# Patient Record
Sex: Female | Born: 1935 | Race: White | Hispanic: No | Smoking: Former smoker
Health system: Southern US, Community
[De-identification: ages and names within clinical notes are randomized; demographics above are authoritative.]

## PROBLEM LIST (undated history)

## (undated) DIAGNOSIS — C50919 Malignant neoplasm of unspecified site of unspecified female breast: Secondary | ICD-10-CM

## (undated) DIAGNOSIS — Z952 Presence of prosthetic heart valve: Secondary | ICD-10-CM

## (undated) DIAGNOSIS — E785 Hyperlipidemia, unspecified: Secondary | ICD-10-CM

## (undated) DIAGNOSIS — R011 Cardiac murmur, unspecified: Secondary | ICD-10-CM

## (undated) DIAGNOSIS — I10 Essential (primary) hypertension: Secondary | ICD-10-CM

## (undated) DIAGNOSIS — I350 Nonrheumatic aortic (valve) stenosis: Secondary | ICD-10-CM

## (undated) HISTORY — PX: TONSILLECTOMY: SUR1361

## (undated) HISTORY — DX: Malignant neoplasm of unspecified site of unspecified female breast: C50.919

## (undated) HISTORY — PX: CARDIAC CATHETERIZATION: SHX172

## (undated) HISTORY — PX: BILATERAL KNEE ARTHROSCOPY: SUR91

## (undated) HISTORY — DX: Cardiac murmur, unspecified: R01.1

## (undated) HISTORY — PX: OTHER SURGICAL HISTORY: SHX169

## (undated) HISTORY — DX: Hyperlipidemia, unspecified: E78.5

## (undated) HISTORY — DX: Essential (primary) hypertension: I10

---

## 1999-11-18 ENCOUNTER — Other Ambulatory Visit: Admission: RE | Admit: 1999-11-18 | Discharge: 1999-11-18 | Payer: Self-pay | Admitting: Internal Medicine

## 2001-02-22 ENCOUNTER — Ambulatory Visit (HOSPITAL_COMMUNITY): Admission: RE | Admit: 2001-02-22 | Discharge: 2001-02-22 | Payer: Self-pay | Admitting: *Deleted

## 2001-07-25 ENCOUNTER — Ambulatory Visit (HOSPITAL_BASED_OUTPATIENT_CLINIC_OR_DEPARTMENT_OTHER): Admission: RE | Admit: 2001-07-25 | Discharge: 2001-07-25 | Payer: Self-pay | Admitting: Orthopedic Surgery

## 2002-03-10 ENCOUNTER — Other Ambulatory Visit: Admission: RE | Admit: 2002-03-10 | Discharge: 2002-03-10 | Payer: Self-pay | Admitting: Obstetrics and Gynecology

## 2002-11-16 DIAGNOSIS — C50919 Malignant neoplasm of unspecified site of unspecified female breast: Secondary | ICD-10-CM

## 2002-11-16 HISTORY — DX: Malignant neoplasm of unspecified site of unspecified female breast: C50.919

## 2002-11-16 HISTORY — PX: BREAST LUMPECTOMY: SHX2

## 2003-04-11 ENCOUNTER — Other Ambulatory Visit: Admission: RE | Admit: 2003-04-11 | Discharge: 2003-04-11 | Payer: Self-pay | Admitting: Diagnostic Radiology

## 2003-04-11 ENCOUNTER — Encounter: Payer: Self-pay | Admitting: General Surgery

## 2003-04-11 ENCOUNTER — Encounter (INDEPENDENT_AMBULATORY_CARE_PROVIDER_SITE_OTHER): Payer: Self-pay | Admitting: *Deleted

## 2003-04-11 ENCOUNTER — Encounter: Admission: RE | Admit: 2003-04-11 | Discharge: 2003-04-11 | Payer: Self-pay | Admitting: General Surgery

## 2003-04-18 ENCOUNTER — Encounter (HOSPITAL_COMMUNITY): Admission: RE | Admit: 2003-04-18 | Discharge: 2003-07-17 | Payer: Self-pay | Admitting: General Surgery

## 2003-04-18 ENCOUNTER — Encounter: Payer: Self-pay | Admitting: General Surgery

## 2003-04-19 ENCOUNTER — Encounter: Payer: Self-pay | Admitting: General Surgery

## 2003-04-27 ENCOUNTER — Encounter: Admission: RE | Admit: 2003-04-27 | Discharge: 2003-04-27 | Payer: Self-pay | Admitting: General Surgery

## 2003-04-27 ENCOUNTER — Encounter: Payer: Self-pay | Admitting: General Surgery

## 2003-04-30 ENCOUNTER — Ambulatory Visit (HOSPITAL_BASED_OUTPATIENT_CLINIC_OR_DEPARTMENT_OTHER): Admission: RE | Admit: 2003-04-30 | Discharge: 2003-04-30 | Payer: Self-pay | Admitting: General Surgery

## 2003-04-30 ENCOUNTER — Encounter: Admission: RE | Admit: 2003-04-30 | Discharge: 2003-04-30 | Payer: Self-pay | Admitting: General Surgery

## 2003-04-30 ENCOUNTER — Encounter (INDEPENDENT_AMBULATORY_CARE_PROVIDER_SITE_OTHER): Payer: Self-pay | Admitting: Specialist

## 2003-04-30 ENCOUNTER — Encounter: Payer: Self-pay | Admitting: General Surgery

## 2003-05-11 ENCOUNTER — Ambulatory Visit: Admission: RE | Admit: 2003-05-11 | Discharge: 2003-07-25 | Payer: Self-pay | Admitting: Radiation Oncology

## 2003-08-30 ENCOUNTER — Ambulatory Visit: Admission: RE | Admit: 2003-08-30 | Discharge: 2003-08-30 | Payer: Self-pay | Admitting: Radiation Oncology

## 2003-10-28 ENCOUNTER — Emergency Department (HOSPITAL_COMMUNITY): Admission: EM | Admit: 2003-10-28 | Discharge: 2003-10-28 | Payer: Self-pay | Admitting: Emergency Medicine

## 2004-02-28 ENCOUNTER — Ambulatory Visit: Admission: RE | Admit: 2004-02-28 | Discharge: 2004-02-28 | Payer: Self-pay | Admitting: Radiation Oncology

## 2004-03-10 ENCOUNTER — Encounter: Admission: RE | Admit: 2004-03-10 | Discharge: 2004-03-10 | Payer: Self-pay | Admitting: Oncology

## 2004-06-03 ENCOUNTER — Encounter: Admission: RE | Admit: 2004-06-03 | Discharge: 2004-06-03 | Payer: Self-pay | Admitting: Oncology

## 2004-12-15 ENCOUNTER — Ambulatory Visit: Payer: Self-pay | Admitting: Oncology

## 2005-03-03 ENCOUNTER — Other Ambulatory Visit: Admission: RE | Admit: 2005-03-03 | Discharge: 2005-03-03 | Payer: Self-pay | Admitting: Internal Medicine

## 2005-03-11 ENCOUNTER — Encounter: Admission: RE | Admit: 2005-03-11 | Discharge: 2005-03-11 | Payer: Self-pay | Admitting: General Surgery

## 2005-06-04 ENCOUNTER — Ambulatory Visit: Payer: Self-pay | Admitting: Oncology

## 2005-12-10 ENCOUNTER — Ambulatory Visit: Payer: Self-pay | Admitting: Oncology

## 2006-03-19 ENCOUNTER — Encounter: Admission: RE | Admit: 2006-03-19 | Discharge: 2006-03-19 | Payer: Self-pay | Admitting: Oncology

## 2006-06-04 ENCOUNTER — Ambulatory Visit: Payer: Self-pay | Admitting: Oncology

## 2006-06-04 LAB — CBC WITH DIFFERENTIAL/PLATELET
BASO%: 0.4 % (ref 0.0–2.0)
Basophils Absolute: 0 10*3/uL (ref 0.0–0.1)
EOS%: 2.7 % (ref 0.0–7.0)
Eosinophils Absolute: 0.1 10*3/uL (ref 0.0–0.5)
HCT: 32.7 % — ABNORMAL LOW (ref 34.8–46.6)
HGB: 11.1 g/dL — ABNORMAL LOW (ref 11.6–15.9)
LYMPH%: 26.2 % (ref 14.0–48.0)
MCH: 32.1 pg (ref 26.0–34.0)
MCHC: 34.1 g/dL (ref 32.0–36.0)
MCV: 94.3 fL (ref 81.0–101.0)
MONO#: 0.4 10*3/uL (ref 0.1–0.9)
MONO%: 8.9 % (ref 0.0–13.0)
NEUT#: 2.8 10*3/uL (ref 1.5–6.5)
NEUT%: 61.8 % (ref 39.6–76.8)
Platelets: 211 10*3/uL (ref 145–400)
RBC: 3.47 10*6/uL — ABNORMAL LOW (ref 3.70–5.32)
RDW: 14.4 % (ref 11.3–14.5)
WBC: 4.5 10*3/uL (ref 3.9–10.0)
lymph#: 1.2 10*3/uL (ref 0.9–3.3)

## 2006-06-04 LAB — COMPREHENSIVE METABOLIC PANEL
ALT: 9 U/L (ref 0–40)
AST: 18 U/L (ref 0–37)
Albumin: 4.4 g/dL (ref 3.5–5.2)
Alkaline Phosphatase: 28 U/L — ABNORMAL LOW (ref 39–117)
BUN: 24 mg/dL — ABNORMAL HIGH (ref 6–23)
CO2: 26 mEq/L (ref 19–32)
Calcium: 9.5 mg/dL (ref 8.4–10.5)
Chloride: 101 mEq/L (ref 96–112)
Creatinine, Ser: 0.9 mg/dL (ref 0.40–1.20)
Glucose, Bld: 97 mg/dL (ref 70–99)
Potassium: 4.8 mEq/L (ref 3.5–5.3)
Sodium: 138 mEq/L (ref 135–145)
Total Bilirubin: 0.4 mg/dL (ref 0.3–1.2)
Total Protein: 6.7 g/dL (ref 6.0–8.3)

## 2006-06-04 LAB — CANCER ANTIGEN 27.29: CA 27.29: 14 U/mL (ref 0–39)

## 2006-12-08 ENCOUNTER — Ambulatory Visit: Payer: Self-pay | Admitting: Oncology

## 2007-03-21 ENCOUNTER — Encounter: Admission: RE | Admit: 2007-03-21 | Discharge: 2007-03-21 | Payer: Self-pay | Admitting: Endocrinology

## 2007-06-01 ENCOUNTER — Ambulatory Visit: Payer: Self-pay | Admitting: Oncology

## 2007-06-03 LAB — CBC WITH DIFFERENTIAL/PLATELET
BASO%: 0.4 % (ref 0.0–2.0)
Basophils Absolute: 0 10*3/uL (ref 0.0–0.1)
EOS%: 3.7 % (ref 0.0–7.0)
Eosinophils Absolute: 0.1 10*3/uL (ref 0.0–0.5)
HCT: 31.9 % — ABNORMAL LOW (ref 34.8–46.6)
HGB: 11.1 g/dL — ABNORMAL LOW (ref 11.6–15.9)
LYMPH%: 33.2 % (ref 14.0–48.0)
MCH: 32.2 pg (ref 26.0–34.0)
MCHC: 34.7 g/dL (ref 32.0–36.0)
MCV: 92.8 fL (ref 81.0–101.0)
MONO#: 0.4 10*3/uL (ref 0.1–0.9)
MONO%: 11 % (ref 0.0–13.0)
NEUT#: 1.7 10*3/uL (ref 1.5–6.5)
NEUT%: 51.7 % (ref 39.6–76.8)
Platelets: 187 10*3/uL (ref 145–400)
RBC: 3.44 10*6/uL — ABNORMAL LOW (ref 3.70–5.32)
RDW: 14.9 % — ABNORMAL HIGH (ref 11.3–14.5)
WBC: 3.4 10*3/uL — ABNORMAL LOW (ref 3.9–10.0)
lymph#: 1.1 10*3/uL (ref 0.9–3.3)

## 2007-06-03 LAB — COMPREHENSIVE METABOLIC PANEL
ALT: <8 U/L (ref 0–35)
AST: 18 U/L (ref 0–37)
Albumin: 4.3 g/dL (ref 3.5–5.2)
Alkaline Phosphatase: 28 U/L — ABNORMAL LOW (ref 39–117)
BUN: 27 mg/dL — ABNORMAL HIGH (ref 6–23)
CO2: 24 mEq/L (ref 19–32)
Calcium: 9.3 mg/dL (ref 8.4–10.5)
Chloride: 105 mEq/L (ref 96–112)
Creatinine, Ser: 0.84 mg/dL (ref 0.40–1.20)
Glucose, Bld: 107 mg/dL — ABNORMAL HIGH (ref 70–99)
Potassium: 4.5 mEq/L (ref 3.5–5.3)
Sodium: 139 mEq/L (ref 135–145)
Total Bilirubin: 0.4 mg/dL (ref 0.3–1.2)
Total Protein: 6.7 g/dL (ref 6.0–8.3)

## 2007-06-03 LAB — VITAMIN B12: Vitamin B-12: 207 pg/mL — ABNORMAL LOW (ref 211–911)

## 2007-06-03 LAB — FOLATE: Folate: >20 ng/mL

## 2007-06-03 LAB — FERRITIN: Ferritin: 120 ng/mL (ref 10–291)

## 2007-06-22 ENCOUNTER — Other Ambulatory Visit: Admission: RE | Admit: 2007-06-22 | Discharge: 2007-06-22 | Payer: Self-pay | Admitting: Internal Medicine

## 2007-07-28 ENCOUNTER — Ambulatory Visit: Payer: Self-pay | Admitting: Oncology

## 2007-08-01 LAB — CBC & DIFF AND RETIC
BASO%: 0.5 % (ref 0.0–2.0)
Basophils Absolute: 0 10*3/uL (ref 0.0–0.1)
EOS%: 2.4 % (ref 0.0–7.0)
Eosinophils Absolute: 0.1 10*3/uL (ref 0.0–0.5)
HCT: 31 % — ABNORMAL LOW (ref 34.8–46.6)
HGB: 11 g/dL — ABNORMAL LOW (ref 11.6–15.9)
IRF: 0.3 (ref 0.130–0.330)
LYMPH%: 33.2 % (ref 14.0–48.0)
MCH: 32.7 pg (ref 26.0–34.0)
MCHC: 35.3 g/dL (ref 32.0–36.0)
MCV: 92.6 fL (ref 81.0–101.0)
MONO#: 0.3 10*3/uL (ref 0.1–0.9)
MONO%: 9.4 % (ref 0.0–13.0)
NEUT#: 1.9 10*3/uL (ref 1.5–6.5)
NEUT%: 54.5 % (ref 39.6–76.8)
Platelets: 223 10*3/uL (ref 145–400)
RBC: 3.35 10*6/uL — ABNORMAL LOW (ref 3.70–5.32)
RDW: 14.5 % (ref 11.3–14.5)
RETIC #: 39.9 10*3/uL (ref 19.7–115.1)
Retic %: 1.2 % (ref 0.4–2.3)
WBC: 3.4 10*3/uL — ABNORMAL LOW (ref 3.9–10.0)
lymph#: 1.1 10*3/uL (ref 0.9–3.3)

## 2007-08-01 LAB — VITAMIN B12: Vitamin B-12: 445 pg/mL (ref 211–911)

## 2007-08-03 LAB — METHYLMALONIC ACID, SERUM: Methylmalonic Acid, Quantitative: 254 nmol/L (ref 87–318)

## 2007-10-07 ENCOUNTER — Encounter: Admission: RE | Admit: 2007-10-07 | Discharge: 2007-10-07 | Payer: Self-pay | Admitting: General Surgery

## 2007-12-12 ENCOUNTER — Ambulatory Visit: Payer: Self-pay | Admitting: Oncology

## 2008-05-15 ENCOUNTER — Ambulatory Visit: Payer: Self-pay | Admitting: Oncology

## 2008-05-17 LAB — CBC & DIFF AND RETIC
BASO%: 0.4 % (ref 0.0–2.0)
Basophils Absolute: 0 10*3/uL (ref 0.0–0.1)
EOS%: 2.9 % (ref 0.0–7.0)
Eosinophils Absolute: 0.1 10*3/uL (ref 0.0–0.5)
HCT: 32.2 % — ABNORMAL LOW (ref 34.8–46.6)
HGB: 11.2 g/dL — ABNORMAL LOW (ref 11.6–15.9)
IRF: 0.49 — ABNORMAL HIGH (ref 0.130–0.330)
LYMPH%: 31 % (ref 14.0–48.0)
MCH: 32.3 pg (ref 26.0–34.0)
MCHC: 34.8 g/dL (ref 32.0–36.0)
MCV: 92.8 fL (ref 81.0–101.0)
MONO#: 0.3 10*3/uL (ref 0.1–0.9)
MONO%: 9.3 % (ref 0.0–13.0)
NEUT#: 2.1 10*3/uL (ref 1.5–6.5)
NEUT%: 56.4 % (ref 39.6–76.8)
Platelets: 193 10*3/uL (ref 145–400)
RBC: 3.47 10*6/uL — ABNORMAL LOW (ref 3.70–5.32)
RDW: 14.5 % (ref 11.3–14.5)
RETIC #: 49.6 10*3/uL (ref 19.7–115.1)
Retic %: 1.4 % (ref 0.4–2.3)
WBC: 3.8 10*3/uL — ABNORMAL LOW (ref 3.9–10.0)
lymph#: 1.2 10*3/uL (ref 0.9–3.3)

## 2008-05-21 LAB — COMPREHENSIVE METABOLIC PANEL
ALT: 13 U/L (ref 0–35)
AST: 22 U/L (ref 0–37)
Albumin: 4.6 g/dL (ref 3.5–5.2)
Alkaline Phosphatase: 23 U/L — ABNORMAL LOW (ref 39–117)
BUN: 24 mg/dL — ABNORMAL HIGH (ref 6–23)
CO2: 24 mEq/L (ref 19–32)
Calcium: 9.4 mg/dL (ref 8.4–10.5)
Chloride: 104 mEq/L (ref 96–112)
Creatinine, Ser: 0.8 mg/dL (ref 0.40–1.20)
Glucose, Bld: 103 mg/dL — ABNORMAL HIGH (ref 70–99)
Potassium: 4.4 mEq/L (ref 3.5–5.3)
Sodium: 140 mEq/L (ref 135–145)
Total Bilirubin: 0.5 mg/dL (ref 0.3–1.2)
Total Protein: 7 g/dL (ref 6.0–8.3)

## 2008-05-21 LAB — CANCER ANTIGEN 27.29: CA 27.29: 13 U/mL (ref 0–39)

## 2008-05-21 LAB — FOLATE: Folate: >20 ng/mL

## 2008-05-21 LAB — FERRITIN: Ferritin: 84 ng/mL (ref 10–291)

## 2008-05-21 LAB — VITAMIN B12: Vitamin B-12: 461 pg/mL (ref 211–911)

## 2008-05-21 LAB — VITAMIN D 25 HYDROXY (VIT D DEFICIENCY, FRACTURES): Vit D, 25-Hydroxy: 31 ng/mL (ref 30–89)

## 2009-01-04 ENCOUNTER — Encounter: Admission: RE | Admit: 2009-01-04 | Discharge: 2009-01-04 | Payer: Self-pay | Admitting: Endocrinology

## 2009-05-14 ENCOUNTER — Ambulatory Visit: Payer: Self-pay | Admitting: Oncology

## 2009-05-14 LAB — COMPREHENSIVE METABOLIC PANEL
ALT: <8 U/L (ref 0–35)
AST: 14 U/L (ref 0–37)
Albumin: 3.9 g/dL (ref 3.5–5.2)
Alkaline Phosphatase: 27 U/L — ABNORMAL LOW (ref 39–117)
BUN: 23 mg/dL (ref 6–23)
CO2: 24 mEq/L (ref 19–32)
Calcium: 8.8 mg/dL (ref 8.4–10.5)
Chloride: 108 mEq/L (ref 96–112)
Creatinine, Ser: 0.8 mg/dL (ref 0.40–1.20)
Glucose, Bld: 103 mg/dL — ABNORMAL HIGH (ref 70–99)
Potassium: 4.5 mEq/L (ref 3.5–5.3)
Sodium: 141 mEq/L (ref 135–145)
Total Bilirubin: 0.4 mg/dL (ref 0.3–1.2)
Total Protein: 6.1 g/dL (ref 6.0–8.3)

## 2009-05-14 LAB — CBC WITH DIFFERENTIAL/PLATELET
BASO%: 0.6 % (ref 0.0–2.0)
Basophils Absolute: 0 10*3/uL (ref 0.0–0.1)
EOS%: 4.4 % (ref 0.0–7.0)
Eosinophils Absolute: 0.2 10*3/uL (ref 0.0–0.5)
HCT: 31.4 % — ABNORMAL LOW (ref 34.8–46.6)
HGB: 10.7 g/dL — ABNORMAL LOW (ref 11.6–15.9)
LYMPH%: 15.3 % (ref 14.0–49.7)
MCH: 32.2 pg (ref 25.1–34.0)
MCHC: 34.1 g/dL (ref 31.5–36.0)
MCV: 94.6 fL (ref 79.5–101.0)
MONO#: 0.3 10*3/uL (ref 0.1–0.9)
MONO%: 8.2 % (ref 0.0–14.0)
NEUT#: 2.6 10*3/uL (ref 1.5–6.5)
NEUT%: 71.5 % (ref 38.4–76.8)
Platelets: 197 10*3/uL (ref 145–400)
RBC: 3.32 10*6/uL — ABNORMAL LOW (ref 3.70–5.45)
RDW: 14.9 % — ABNORMAL HIGH (ref 11.2–14.5)
WBC: 3.6 10*3/uL — ABNORMAL LOW (ref 3.9–10.3)
lymph#: 0.5 10*3/uL — ABNORMAL LOW (ref 0.9–3.3)

## 2009-05-14 LAB — LIPID PANEL
Cholesterol: 238 mg/dL — ABNORMAL HIGH (ref 0–200)
HDL: 73 mg/dL (ref >39)
LDL Cholesterol: 152 mg/dL — ABNORMAL HIGH (ref 0–99)
Total CHOL/HDL Ratio: 3.3 Ratio
Triglycerides: 63 mg/dL (ref <150)
VLDL: 13 mg/dL (ref 0–40)

## 2009-06-03 ENCOUNTER — Other Ambulatory Visit: Admission: RE | Admit: 2009-06-03 | Discharge: 2009-06-03 | Payer: Self-pay | Admitting: Endocrinology

## 2010-01-08 ENCOUNTER — Encounter: Admission: RE | Admit: 2010-01-08 | Discharge: 2010-01-08 | Payer: Self-pay | Admitting: General Surgery

## 2010-05-20 ENCOUNTER — Ambulatory Visit: Payer: Self-pay | Admitting: Oncology

## 2010-05-22 LAB — CBC WITH DIFFERENTIAL/PLATELET
BASO%: 0.5 % (ref 0.0–2.0)
Basophils Absolute: 0 10*3/uL (ref 0.0–0.1)
EOS%: 5.2 % (ref 0.0–7.0)
Eosinophils Absolute: 0.2 10*3/uL (ref 0.0–0.5)
HCT: 33.1 % — ABNORMAL LOW (ref 34.8–46.6)
HGB: 11.3 g/dL — ABNORMAL LOW (ref 11.6–15.9)
LYMPH%: 30.1 % (ref 14.0–49.7)
MCH: 32.6 pg (ref 25.1–34.0)
MCHC: 34.2 g/dL (ref 31.5–36.0)
MCV: 95.4 fL (ref 79.5–101.0)
MONO#: 0.3 10*3/uL (ref 0.1–0.9)
MONO%: 9 % (ref 0.0–14.0)
NEUT#: 2 10*3/uL (ref 1.5–6.5)
NEUT%: 55.2 % (ref 38.4–76.8)
Platelets: 172 10*3/uL (ref 145–400)
RBC: 3.47 10*6/uL — ABNORMAL LOW (ref 3.70–5.45)
RDW: 14.5 % (ref 11.2–14.5)
WBC: 3.7 10*3/uL — ABNORMAL LOW (ref 3.9–10.3)
lymph#: 1.1 10*3/uL (ref 0.9–3.3)

## 2010-05-22 LAB — COMPREHENSIVE METABOLIC PANEL
ALT: <8 U/L (ref 0–35)
AST: 19 U/L (ref 0–37)
Albumin: 4.4 g/dL (ref 3.5–5.2)
Alkaline Phosphatase: 32 U/L — ABNORMAL LOW (ref 39–117)
BUN: 24 mg/dL — ABNORMAL HIGH (ref 6–23)
CO2: 21 mEq/L (ref 19–32)
Calcium: 9.2 mg/dL (ref 8.4–10.5)
Chloride: 105 mEq/L (ref 96–112)
Creatinine, Ser: 0.83 mg/dL (ref 0.40–1.20)
Glucose, Bld: 109 mg/dL — ABNORMAL HIGH (ref 70–99)
Potassium: 4.6 mEq/L (ref 3.5–5.3)
Sodium: 138 mEq/L (ref 135–145)
Total Bilirubin: 0.4 mg/dL (ref 0.3–1.2)
Total Protein: 6.6 g/dL (ref 6.0–8.3)

## 2010-12-07 ENCOUNTER — Encounter: Payer: Self-pay | Admitting: Oncology

## 2010-12-09 ENCOUNTER — Other Ambulatory Visit: Payer: Self-pay | Admitting: Internal Medicine

## 2010-12-09 DIAGNOSIS — Z9889 Other specified postprocedural states: Secondary | ICD-10-CM

## 2011-01-09 ENCOUNTER — Ambulatory Visit
Admission: RE | Admit: 2011-01-09 | Discharge: 2011-01-09 | Disposition: A | Discharge status: Home / Self Care | Payer: Medicare Other | Source: Ambulatory Visit | Attending: Internal Medicine | Admitting: Internal Medicine

## 2011-01-09 DIAGNOSIS — Z9889 Other specified postprocedural states: Secondary | ICD-10-CM

## 2011-04-03 NOTE — Procedures (Signed)
[name]. [facility]  Patient:    Mallory Thomas, Mallory Thomas                        MRN: [id number] Proc. Date: 02/22/01 Adm. Date:  [id number] Attending:  Jim Desanctis                           Procedure Report  PROCEDURE PERFORMED:  Colonoscopy.  ENDOSCOPIST:  Jim Desanctis, M.D.  INDICATIONS FOR PROCEDURE:  Colon cancer screening, Hemoccult positivity.  ANESTHESIA:  Demerol 50 mg, Versed 5 mg.  DESCRIPTION OF PROCEDURE:  With the patient mildly sedated in the left lateral decubitus position, the Olympus videoscopic variable stiffness colonoscope was inserted in the rectum and passed under direct vision into the cecum.  The cecum was identified by the ileocecal valve and appendiceal orifice, both of which were photographed.  From this point, the colonoscope was slowly withdrawn, taking circumferential views of the entire colonic mucosa, stopping only then in the rectum which appeared normal on direct and showed internal hemorrhoids on retroflex view.  The endoscope was straightened and withdrawn.  Patients vital signs and pulse oximeter remained stable.  The patient tolerated the procedure well and without apparent complications.  FINDINGS:  Internal hemorrhoids, otherwise unremarkable colonoscopic examination to the cecum.  PLAN:  Repeat examination in five to 10 years. DD:  02/22/01 TD:  02/22/01 Job: [id number]

## 2011-04-03 NOTE — Op Note (Signed)
[place]  Patient:    FEDRA, SATURNO Visit Number: [id number] MRN: [id number]          Service Type: Attending:  Alta Corning, M.D. Proc. Date: 07/25/01                             Operative Report  PREOPERATIVE DIAGNOSIS:  Medial meniscus tear with possible degeneration of medial femoral condyle.  POSTOPERATIVE DIAGNOSIS:  Medial meniscus tear with possible degeneration of medial femoral condyle.  OPERATION PERFORMED:  Debridement of medial femoral condylar osteochondral defects and debridement of medial meniscus tear.  SURGEON:  Alta Corning, M.D.  ASSISTANT:  Alvina Filbert. Natividad Brood.  ANESTHESIA:  General.  INDICATIONS FOR PROCEDURE:  The patient is a 77 year old female with a long history of having medial jointline pain after a twisting type injury. Ultimately, MRI was obtained which showed that she had a medial meniscus tear as well as some injury to the medial femoral condyle.  She was also noted to have a Bakers cyst.  She was brought to the operating room for examination under anesthesia and arthroscopy.  DESCRIPTION OF PROCEDURE:  The patient was brought to the operating room where adequate anesthesia was obtained with a knee block.  The patient was placed supine on the operating table.  The left leg was prepped and draped in the usual sterile fashion.  Following this, routine arthroscopic examination of the knee revealed that there was a posterior horn medial meniscal tear with grade 2 and 3 chondral injuries to the medial femoral condyle and lateral tibial plateau.  The meniscus tear was debrided back to a smooth and stable rim from the midbody of the meniscus around posteriorly.  The medial femoral condyle was ____________ debrided because of the chondromalacia of loose and fragmented pieces.  Once a smooth and stable rim was obtained with articular cartilage and the meniscus was smooth and stable, attention was then  turned to the anterior cruciate which was normal.  Attention was turned laterally where there was noted to be no evidence of abnormality in the lateral femoral condyle.  Attention was turned up into the patellofemoral joint where again there was noted to be no significant evidence of abnormality or maltracking. At this point the knee was copiously irrigated and suctioned dry.  The arthroscopic portals were closed with Steri-Strips.  A sterile compressive dressing was applied.  The patient was taken to the recovery room where she was noted to be in satisfactory condition.  Estimated blood loss for this procedure was none. Attending:  Alta Corning, M.D. DD:  07/25/01 TD:  07/25/01 Job: [id number]

## 2011-04-03 NOTE — Op Note (Signed)
Mallory Thomas, Mallory Thomas NO.:  [id number]   MEDICAL RECORD NO.:  [id number]                   PATIENT TYPE:  AMB   LOCATION:  [place]                                  FACILITY:  [facility]   PHYSICIAN:  Rudell Cobb. Annamaria Boots, M.D.                DATE OF BIRTH:  [birth year]   DATE OF PROCEDURE:  04/30/2003  DATE OF DISCHARGE:                                 OPERATIVE REPORT   PREOPERATIVE DIAGNOSIS:  Stage I carcinoma of the right breast.   POSTOPERATIVE DIAGNOSIS:  Stage I carcinoma of the right breast.   PROCEDURE:  1. Blue dye injection.  2. Right partial mastectomy with needle localization and specimen     mammography.  3. Right axillary sentinel lymph node biopsy.   SURGEON:  Rudell Cobb. Annamaria Boots, M.D.   ANESTHESIA:  General.   DESCRIPTION OF PROCEDURE:  Prior to coming to the operating room the patient  had a wire localization and 1 millicurie of technetium sulfa colloid  injected intradermally of the right breast.   After suitable general anesthesia was induced, the patient was placed in the  supine position with both arms extended on arm boards.  5 mL of a mixture of  2 mL of methylene blue and 3 mL of injectable saline was then injected in  the subareolar tissue and the breast gently massaged.  We then prepped and  draped the right breast and axilla.   The wire showed the tumor to be at about the 8 o'clock position of her right  breast. A curved incision was then outlined to incorporate that.  We made  the incision and delivered the wire in through the incision and then did a  wide excision around the wire and surrounding tissue.  The specimen was  oriented for the pathologist. Specimen mammography confirmed the removal of  a lesion. Touch prep showed then to be done on the margin.   While that was being done, we carefully scanned the axilla, and there was  one single hot spot. A transverse right axillary incision was made and  dissected through the  subcutaneous tissue to the clavipectoral fascia.  Just  deep to this, was a bluish and quite hot node with counts in excess of 3000.  That was excised.  There were no other blue, hot, or palpable nodes.  Sentinel node was also submitted to the pathologist.   Touch preps on both the margins and the sentinel nodes were negative.   The incisions were closed with 3-0 Vicryl and subcuticular 4-0 Monocryl and  Steri-Strips.  Dressings were applied.  The patient transferred to the  recovery room in satisfactory condition having tolerated the procedure well.  Rudell Cobb. Annamaria Boots, M.D.    PRY/MEDQ  D:  04/30/2003  T:  04/30/2003  Job:  [id number]   cc:   Ronaldo Miyamoto, M.D.  [place]  [place] [address]  Fax: [phone]

## 2011-05-21 ENCOUNTER — Other Ambulatory Visit: Payer: Self-pay | Admitting: Oncology

## 2011-05-21 ENCOUNTER — Encounter (HOSPITAL_BASED_OUTPATIENT_CLINIC_OR_DEPARTMENT_OTHER): Payer: Medicare Other | Admitting: Oncology

## 2011-05-21 DIAGNOSIS — C50519 Malignant neoplasm of lower-outer quadrant of unspecified female breast: Secondary | ICD-10-CM

## 2011-05-21 LAB — CBC WITH DIFFERENTIAL/PLATELET
BASO%: 0.5 % (ref 0.0–2.0)
Basophils Absolute: 0 10*3/uL (ref 0.0–0.1)
EOS%: 3.7 % (ref 0.0–7.0)
Eosinophils Absolute: 0.1 10*3/uL (ref 0.0–0.5)
HCT: 30.7 % — ABNORMAL LOW (ref 34.8–46.6)
HGB: 10.3 g/dL — ABNORMAL LOW (ref 11.6–15.9)
LYMPH%: 24.3 % (ref 14.0–49.7)
MCH: 31.8 pg (ref 25.1–34.0)
MCHC: 33.6 g/dL (ref 31.5–36.0)
MCV: 94.7 fL (ref 79.5–101.0)
MONO#: 0.4 10*3/uL (ref 0.1–0.9)
MONO%: 9.8 % (ref 0.0–14.0)
NEUT#: 2.4 10*3/uL (ref 1.5–6.5)
NEUT%: 61.7 % (ref 38.4–76.8)
Platelets: 153 10*3/uL (ref 145–400)
RBC: 3.24 10*6/uL — ABNORMAL LOW (ref 3.70–5.45)
RDW: 15.4 % — ABNORMAL HIGH (ref 11.2–14.5)
WBC: 3.9 10*3/uL (ref 3.9–10.3)
lymph#: 0.9 10*3/uL (ref 0.9–3.3)

## 2011-05-21 LAB — COMPREHENSIVE METABOLIC PANEL
ALT: 9 U/L (ref 0–35)
AST: 15 U/L (ref 0–37)
Albumin: 4 g/dL (ref 3.5–5.2)
Alkaline Phosphatase: 27 U/L — ABNORMAL LOW (ref 39–117)
BUN: 25 mg/dL — ABNORMAL HIGH (ref 6–23)
CO2: 24 mEq/L (ref 19–32)
Calcium: 8.7 mg/dL (ref 8.4–10.5)
Chloride: 110 mEq/L (ref 96–112)
Creatinine, Ser: 0.73 mg/dL (ref 0.50–1.10)
Glucose, Bld: 135 mg/dL — ABNORMAL HIGH (ref 70–99)
Potassium: 4.2 mEq/L (ref 3.5–5.3)
Sodium: 143 mEq/L (ref 135–145)
Total Bilirubin: 0.3 mg/dL (ref 0.3–1.2)
Total Protein: 5.9 g/dL — ABNORMAL LOW (ref 6.0–8.3)

## 2011-06-05 ENCOUNTER — Ambulatory Visit (AMBULATORY_SURGERY_CENTER): Payer: Medicare Other | Admitting: *Deleted

## 2011-06-05 VITALS — Ht 62.5 in | Wt 176.8 lb

## 2011-06-05 DIAGNOSIS — Z1211 Encounter for screening for malignant neoplasm of colon: Secondary | ICD-10-CM

## 2011-06-05 MED ORDER — PEG-KCL-NACL-NASULF-NA ASC-C 100 G PO SOLR: ORAL | Status: DC

## 2011-06-19 ENCOUNTER — Other Ambulatory Visit: Payer: Managed Care, Other (non HMO) | Admitting: Gastroenterology

## 2011-06-19 ENCOUNTER — Ambulatory Visit (AMBULATORY_SURGERY_CENTER): Payer: Medicare Other | Admitting: Gastroenterology

## 2011-06-19 ENCOUNTER — Encounter: Payer: Self-pay | Admitting: Gastroenterology

## 2011-06-19 DIAGNOSIS — Z1211 Encounter for screening for malignant neoplasm of colon: Secondary | ICD-10-CM

## 2011-06-19 DIAGNOSIS — D126 Benign neoplasm of colon, unspecified: Secondary | ICD-10-CM

## 2011-06-19 LAB — GLUCOSE, CAPILLARY
Glucose-Capillary: 109 mg/dL — ABNORMAL HIGH (ref 70–99)
Glucose-Capillary: 109 mg/dL — ABNORMAL HIGH (ref 70–99)

## 2011-06-19 MED ORDER — SODIUM CHLORIDE 0.9 % IV SOLN: 500.0000 mL | INTRAVENOUS | Status: DC

## 2011-06-19 NOTE — Patient Instructions (Signed)
Please refer to your blue and neon green sheets for instructions regarding diet and activity for the rest of today.  You may resume your medications as you would normally take them.  

## 2011-06-22 ENCOUNTER — Telehealth: Payer: Self-pay | Admitting: *Deleted

## 2011-06-22 NOTE — Telephone Encounter (Signed)
NO message left no I.D.

## 2011-06-27 ENCOUNTER — Encounter: Payer: Self-pay | Admitting: Gastroenterology

## 2011-08-31 ENCOUNTER — Encounter: Payer: Self-pay | Admitting: *Deleted

## 2011-09-02 ENCOUNTER — Telehealth: Payer: Self-pay | Admitting: Oncology

## 2011-09-03 NOTE — Telephone Encounter (Signed)
This is a test.

## 2011-09-17 ENCOUNTER — Other Ambulatory Visit: Payer: Self-pay | Admitting: *Deleted

## 2011-09-17 ENCOUNTER — Encounter: Payer: Self-pay | Admitting: *Deleted

## 2011-09-17 DIAGNOSIS — C50919 Malignant neoplasm of unspecified site of unspecified female breast: Secondary | ICD-10-CM | POA: Insufficient documentation

## 2011-12-23 ENCOUNTER — Encounter: Payer: Self-pay | Admitting: *Deleted

## 2011-12-23 ENCOUNTER — Other Ambulatory Visit: Payer: Self-pay | Admitting: Oncology

## 2011-12-23 DIAGNOSIS — C50919 Malignant neoplasm of unspecified site of unspecified female breast: Secondary | ICD-10-CM

## 2011-12-23 NOTE — Progress Notes (Signed)
12/23/11- Research NSABP B-42/ month 42 study notes- The pt was into the cancer center today for her month 42 study drug exchange.  She returned her bottles 13 and 14 today for drug accountability check.  The pt returned a total of 33 pills today all from Bottle 14 (Bottle 13 was empty). She stated that she took "all" of her study drug.   She was dispensed her Bottles 15 and 16 for self administration.  She was reminded that her yearly mammogram is due in February 2013.  She said that she would call today for an appt.  Her last bone density test was done in October 2012.  She said that she will have her cholesterol checked on 12/25/11.  The pt said that she will ask her doctor to fax the results to the research department.  The pt denied any hospitalizations and any bone fractures.  She denied any new adverse events.  She specifically denied any cardiac, GI, neurological and musculoskeletal problems.  She also denies any hot flashes.  The pt states she feels "great".  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  She denies starting any new prescribed bone health medications.  She will see Dr. Jana Hakim for her month 48 visit in the summer.  Rn thanked the pt for her continued support of this clinical trial.

## 2011-12-24 ENCOUNTER — Other Ambulatory Visit: Payer: Self-pay | Admitting: Oncology

## 2011-12-24 DIAGNOSIS — Z1231 Encounter for screening mammogram for malignant neoplasm of breast: Secondary | ICD-10-CM

## 2012-01-14 ENCOUNTER — Ambulatory Visit
Admission: RE | Admit: 2012-01-14 | Discharge: 2012-01-14 | Disposition: A | Discharge status: Home / Self Care | Payer: Medicare Other | Source: Ambulatory Visit | Attending: Oncology | Admitting: Oncology

## 2012-01-14 DIAGNOSIS — Z1231 Encounter for screening mammogram for malignant neoplasm of breast: Secondary | ICD-10-CM

## 2012-05-25 ENCOUNTER — Other Ambulatory Visit: Payer: Self-pay | Admitting: *Deleted

## 2012-05-25 ENCOUNTER — Ambulatory Visit (HOSPITAL_BASED_OUTPATIENT_CLINIC_OR_DEPARTMENT_OTHER): Payer: Medicare Other | Admitting: Oncology

## 2012-05-25 ENCOUNTER — Telehealth: Payer: Self-pay | Admitting: *Deleted

## 2012-05-25 ENCOUNTER — Encounter: Payer: Self-pay | Admitting: *Deleted

## 2012-05-25 ENCOUNTER — Other Ambulatory Visit (HOSPITAL_BASED_OUTPATIENT_CLINIC_OR_DEPARTMENT_OTHER): Payer: Medicare Other | Admitting: Lab

## 2012-05-25 ENCOUNTER — Other Ambulatory Visit: Payer: Self-pay | Admitting: Oncology

## 2012-05-25 VITALS — BP 115/69 | HR 92 | Temp 98.3°F | Ht 62.5 in | Wt 166.9 lb

## 2012-05-25 DIAGNOSIS — C50919 Malignant neoplasm of unspecified site of unspecified female breast: Secondary | ICD-10-CM

## 2012-05-25 DIAGNOSIS — C50519 Malignant neoplasm of lower-outer quadrant of unspecified female breast: Secondary | ICD-10-CM

## 2012-05-25 DIAGNOSIS — Z170 Estrogen receptor positive status [ER+]: Secondary | ICD-10-CM

## 2012-05-25 LAB — COMPREHENSIVE METABOLIC PANEL
ALT: <8 U/L (ref 0–35)
AST: 16 U/L (ref 0–37)
Albumin: 3.9 g/dL (ref 3.5–5.2)
Alkaline Phosphatase: 32 U/L — ABNORMAL LOW (ref 39–117)
BUN: 20 mg/dL (ref 6–23)
CO2: 27 mEq/L (ref 19–32)
Calcium: 9.1 mg/dL (ref 8.4–10.5)
Chloride: 106 mEq/L (ref 96–112)
Creatinine, Ser: 0.85 mg/dL (ref 0.50–1.10)
Glucose, Bld: 102 mg/dL — ABNORMAL HIGH (ref 70–99)
Potassium: 4.3 mEq/L (ref 3.5–5.3)
Sodium: 141 mEq/L (ref 135–145)
Total Bilirubin: 0.5 mg/dL (ref 0.3–1.2)
Total Protein: 6.2 g/dL (ref 6.0–8.3)

## 2012-05-25 LAB — CBC WITH DIFFERENTIAL/PLATELET
BASO%: 0.5 % (ref 0.0–2.0)
Basophils Absolute: 0 10*3/uL (ref 0.0–0.1)
EOS%: 2.9 % (ref 0.0–7.0)
Eosinophils Absolute: 0.2 10*3/uL (ref 0.0–0.5)
HCT: 34.6 % — ABNORMAL LOW (ref 34.8–46.6)
HGB: 11.6 g/dL (ref 11.6–15.9)
LYMPH%: 15.9 % (ref 14.0–49.7)
MCH: 31.7 pg (ref 25.1–34.0)
MCHC: 33.5 g/dL (ref 31.5–36.0)
MCV: 94.4 fL (ref 79.5–101.0)
MONO#: 0.4 10*3/uL (ref 0.1–0.9)
MONO%: 6.8 % (ref 0.0–14.0)
NEUT#: 4.6 10*3/uL (ref 1.5–6.5)
NEUT%: 73.9 % (ref 38.4–76.8)
Platelets: 158 10*3/uL (ref 145–400)
RBC: 3.67 10*6/uL — ABNORMAL LOW (ref 3.70–5.45)
RDW: 14.4 % (ref 11.2–14.5)
WBC: 6.2 10*3/uL (ref 3.9–10.3)
lymph#: 1 10*3/uL (ref 0.9–3.3)

## 2012-05-25 MED ORDER — LETROZOLE/PLACEBO 2.5MG TABS #110 NSABP B-42: 2.5000 mg | ORAL_TABLET | Freq: Every day | ORAL | Status: DC

## 2012-05-25 NOTE — Progress Notes (Signed)
ID: Mallory Thomas   DOB: 09/17/1932  MR#: [id number]  [id number]  HISTORY OF PRESENT ILLNESS: Screening mammogram at [facility] showed a suspicious lesion in her right breast.  She was then referred to the Branch where ultrasound was performed and core needle biopsy was obtained.  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 Annamaria Boots for further evaluation.  On 04/30/03, she underwent right lumpectomy with axillary lymph node biopsy and the final pathology [id number]) showed an infiltrating ductal carcinoma 0.9 cm on this study, with one sentinel lymph node negative for spread of disease.  Grade was 2.  There was no evidence of vascular or lymphatic invasion.  There was no extensive intraductal component.  Note that the patient's core biopsy which measured 1.2 cm, although that is not stated in the original pathology report, is being used as the "T" portion of the patient's staging record.  INTERVAL HISTORY: Mallory Collins. returns for routine followup of her breast cancer. The interval history is significant for her having gotten herself at home new set of teeth. Of course she had bilateral cataract surgeries last year. Another event is a fall at her daughter's garage on weight ground with slippery slippers, with minor injuries to her right knee and ankle.  REVIEW OF SYSTEMS: Otherwise detailed review of systems is entirely noncontributory and in particular she is having no symptoms related to her letrozole or placebo. She goes to water aerobics every morning. A detailed review of systems was otherwise entirely negative.  CARDIAC RISK FACTORS: The patient has a history of diabetes, with a hemoglobin A1c of 6.9 may of this year. She has a history of hypertension, hypertriglyceridemia with a total cholesterol of 189, triglycerides 50, HDL 75, LDL 104 and the ratio of is 1.4. There is also a positive family  history for heart disease  PAST MEDICAL HISTORY: Past Medical History  Diagnosis Date  . Breast cancer 02/11/03    right  . Diabetes mellitus   . Heart murmur   . Hyperlipidemia   . Hypertension   history cat scratch disease Squamous cell skin cancers (Dr Lindwood Coke)  PAST SURGICAL HISTORY: Past Surgical History  Procedure Date  . Bilateral knee arthroscopy   . Lymph node removal right arm   . Breast lumpectomy Feb 11, 2003    right    FAMILY HISTORY The patient's family were [employer].  Her father died at the age of 49 from a heart attack.  Her mother died at age 45, and she had a breast cancer diagnosed at age 20.  She had a mastectomy and Tamoxifen at that time.   GYNECOLOGIC HISTORY: She is G3, F3, P0, A0, L3.  Menopause age 80.  She never took hormones.  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  Her husband died in the year 02/11/99.  He had lung cancer and was taken care of by Dr. Ralene Ok.  Her children are Heidi (who lives in [place] and sells children's software); Shanon Brow is 24 (who lives in [place] and is in [employer]); and Secundino Ginger is 67 (who lives in [place] and works for [employer] for [employer]).  The patient has two grandsons.  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  She also has a grand-dog "Savannah-Banana" which is a Teacher, English as a foreign language.     ADVANCED DIRECTIVES:  HEALTH MAINTENANCE:  History  Substance Use Topics  . Smoking status: Former Smoker    Quit date: 06/04/1968  . Smokeless tobacco: Not on file  . Alcohol Use: Not on file     occasional     Colonoscopy:  PAP:  Bone density:  Lipid panel:  Allergies  Allergen Reactions  . Shellfish Allergy Hives  . Tape     Current Outpatient Prescriptions  Medication Sig Dispense Refill  . aspirin 81 MG tablet Take 81 mg by mouth daily.        . Calcium Carbonate (CALCIUM 600 PO) Take by mouth daily.          . Cholecalciferol (VITAMIN D) 2000 UNITS CAPS Take by mouth daily.        Marland Kitchen ezetimibe (ZETIA) 10 MG tablet Take 10 mg by mouth daily.        . Glucosamine-Chondroitin-Vit D3 1500-1200-800 MG-MG-UNIT PACK Take by mouth daily.        . Investigational letrozole/placebo 2.5MG  tablet NSABP B-42 Take 2.5 mg by mouth daily.        . irbesartan (AVAPRO) 300 MG tablet Take 300 mg by mouth at bedtime.      . rosuvastatin (CRESTOR) 20 MG tablet Take 20 mg by mouth daily.        . Saxagliptin-Metformin 2.03-999 MG TB24 Take 2 tablets by mouth.        OBJECTIVE: Elderly white woman who looks remarkably well Filed Vitals:   05/25/12 0912  BP: 115/69  Pulse: 92  Temp: 98.3 F (36.8 C)     Body mass index is 30.04 kg/(m^2).    ECOG FS: 0  Sclerae unicteric Oropharynx clear No cervical or supraclavicular adenopathy Lungs no rales or rhonchi Heart regular rate and rhythm Abd benign MSK no focal spinal tenderness, no peripheral edema Neuro: nonfocal Breasts: The right breast is status post lumpectomy. There is no evidence of local recurrence. Left breast is unremarkable.  LAB RESULTS: Lab Results  Component Value Date   WBC 6.2 05/25/2012   NEUTROABS 4.6 05/25/2012   HGB 11.6 05/25/2012   HCT 34.6* 05/25/2012   MCV 94.4 05/25/2012   PLT 158 05/25/2012      Chemistry      Component Value Date/Time   NA 143 05/21/2011 1417   K 4.2 05/21/2011 1417   CL 110 05/21/2011 1417   CO2 24 05/21/2011 1417   BUN 25* 05/21/2011 1417   CREATININE 0.73 05/21/2011 1417      Component Value Date/Time   CALCIUM 8.7 05/21/2011 1417   ALKPHOS 27* 05/21/2011 1417   AST 15 05/21/2011 1417   ALT 9 05/21/2011 1417   BILITOT 0.3 05/21/2011 1417       Lab Results  Component Value Date   LABCA2 13 05/17/2008    No components found with this basename: LABCA125    No results found for this basename: INR:1;PROTIME:1 in the last 168 hours  Urinalysis No results found for this basename: colorurine, appearanceur, labspec,  phurine, glucoseu, hgbur, bilirubinur, ketonesur, proteinur, urobilinogen, nitrite, leukocytesur    STUDIES: No results found.  ASSESSMENT: 78 y.o. [place] woman status post right lumpectomy and sentinel lymph node biopsy June 2004 for a 1.2 cm estrogen and progesterone receptor positive, HER2/neu negative, grade 2 invasive ductal carcinoma, lymph node negative, on Arimidex between July 2004 and July 2009 through the MA27 study, at which [facility] she was enrolled in the B42 study and is either receiving placebo or letrozole.   PLAN: Mallory Thomas is  doing fantastic. She will see me again in August of next year. She will complete her followup of her protocol at that [facility]. She knows to call for any problems that may develop before that visit.   Mallory Thomas C    05/25/2012

## 2012-05-25 NOTE — Telephone Encounter (Signed)
mailed out calendar to inform the patient of the new date and time on 06-2013

## 2012-05-25 NOTE — Progress Notes (Signed)
05/18/12 at 11:03am- NSABP B-42 - month 48 on-study visit - The pt was into the cancer center this am for her continued follow up on the B-42 study.  The pt states she has been in her usual state of good health.  She is continuing to do water aerobics 3 times a week.  She is very active with her family, and she denies any limitations in her mobility.  ECOG=0.  She does report slipping and falling 5 weeks ago today.  She said she was evaluated by [facility].  She states she had several x-rays, and she did not have a broken bone.  She said she was told it was a bad "sprain" and it would take 6 weeks to heal.  The pt was seen and examined today by Dr. Jana Hakim.  She denies any hospitalizations since her last follow up.  She also denies any adverse events today.  She specifically denies GI problems, bleeding, and hot flashes.  The pt has had a recent mammogram in 2013 which was negative for recurrence.  She also has had a recent lipid panel in May 2013 which was normal.  The pt's next bone density scan is due in October 2014.  The pt's study drug arrived after the pt's visit.  Therefore, the nurse and pt agreed to meet on 06/01/12 to dispense her next set of bottles (17 and 18) for self administration.    06/01/12 at 10:09am - The pt's study drug arrived last week after the pt's MD visit.  The pt was into the clinic this am to return her bottle 15 (empty) and to receive her new bottles 17 and 18.  The pt's bottle 16 has remaining study through 06/17/12.  The pt was instructed to keep taking her bottle 16 study drug pills through 06/17/12.  She was then told to begin bottle 17 on 06/18/12.  Her bottle 18 should last through 12/16/12.  The research nurse will call the pt in mid-January to schedule her month 54 pt status update visit and exchange study medication.  The pt verbalized understanding.  The pt stated that she has taken all of her study drug (100% compliance per pt estimate).  The pt also denies any new  adverse events since her follow up visit last week.  She states she is in overall great health.  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  The pt knows to call for any concerns or questions.

## 2012-06-01 ENCOUNTER — Encounter: Payer: Medicare Other | Admitting: *Deleted

## 2012-06-01 MED ORDER — LETROZOLE/PLACEBO 2.5MG TABS #110 NSABP B-42: 2.5000 mg | ORAL_TABLET | Freq: Every day | ORAL | Status: DC

## 2012-12-19 ENCOUNTER — Encounter: Payer: Self-pay | Admitting: *Deleted

## 2012-12-19 ENCOUNTER — Other Ambulatory Visit: Payer: Self-pay | Admitting: Oncology

## 2012-12-19 DIAGNOSIS — C50919 Malignant neoplasm of unspecified site of unspecified female breast: Secondary | ICD-10-CM

## 2012-12-19 MED ORDER — LETROZOLE/PLACEBO 2.5MG TABS #110 NSABP B-42: 2.5000 mg | ORAL_TABLET | Freq: Every day | ORAL | Status: DC

## 2012-12-19 NOTE — Progress Notes (Signed)
12/19/12 at 10:27am - NSABP B-42 month 54 pt status update.  - The pt was into the cancer center this am for her month 76 visit.  The pt states she has been in her usual state of good health.  She is very busy with her volunteer work.  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  ECOG=0.  She denies any hospital visits in the last year.  She said she would schedule her mammogram for March 2014.  She said that she was seen by Dr. Maudie Mercury in December 2013 for some lab work and a physical exam.  The research nurse will request these office labs and notes from Dr. Julianne Rice office today.  She returned her bottle 18 today with 31 pills inside.  She said that she left bottle 17 at home.  She was encouraged to bring this bottle 17 at her next visit in August.  The pt verbalized understanding.  She stated that she has taken "100%" of her study drug since her last visit.  She also was dispensed her 2 new bottles, Bottles 19 and 20.  The pt will begin Bottle 19 today.  She specifically denies any cardiac, neurological, and vascular disorders.  She reviewed the AE form, and she denied any listed adverse events.  She denies any bone fractures since her last visit.  Her bone density will be due in October 2014.  She also denies any new bone density medications.

## 2012-12-20 ENCOUNTER — Other Ambulatory Visit: Payer: Self-pay | Admitting: Oncology

## 2012-12-20 DIAGNOSIS — Z1231 Encounter for screening mammogram for malignant neoplasm of breast: Secondary | ICD-10-CM

## 2013-01-16 ENCOUNTER — Ambulatory Visit
Admission: RE | Admit: 2013-01-16 | Discharge: 2013-01-16 | Disposition: A | Discharge status: Home / Self Care | Payer: Medicare Other | Source: Ambulatory Visit | Attending: Oncology | Admitting: Oncology

## 2013-01-16 DIAGNOSIS — Z1231 Encounter for screening mammogram for malignant neoplasm of breast: Secondary | ICD-10-CM

## 2013-02-07 ENCOUNTER — Telehealth: Payer: Self-pay | Admitting: *Deleted

## 2013-02-07 NOTE — Telephone Encounter (Signed)
sw pt she is aware of her appts d/t.

## 2013-06-14 ENCOUNTER — Other Ambulatory Visit: Payer: Self-pay | Admitting: Physician Assistant

## 2013-06-14 DIAGNOSIS — C50919 Malignant neoplasm of unspecified site of unspecified female breast: Secondary | ICD-10-CM

## 2013-06-15 ENCOUNTER — Other Ambulatory Visit (HOSPITAL_BASED_OUTPATIENT_CLINIC_OR_DEPARTMENT_OTHER): Payer: Medicare Other | Admitting: Lab

## 2013-06-15 DIAGNOSIS — C50919 Malignant neoplasm of unspecified site of unspecified female breast: Secondary | ICD-10-CM

## 2013-06-15 LAB — CBC WITH DIFFERENTIAL/PLATELET
BASO%: 0.5 % (ref 0.0–2.0)
Basophils Absolute: 0 10*3/uL (ref 0.0–0.1)
EOS%: 3.5 % (ref 0.0–7.0)
Eosinophils Absolute: 0.2 10*3/uL (ref 0.0–0.5)
HCT: 34.3 % — ABNORMAL LOW (ref 34.8–46.6)
HGB: 11.5 g/dL — ABNORMAL LOW (ref 11.6–15.9)
LYMPH%: 21.3 % (ref 14.0–49.7)
MCH: 31.5 pg (ref 25.1–34.0)
MCHC: 33.4 g/dL (ref 31.5–36.0)
MCV: 94.3 fL (ref 79.5–101.0)
MONO#: 0.4 10*3/uL (ref 0.1–0.9)
MONO%: 8.3 % (ref 0.0–14.0)
NEUT#: 3.4 10*3/uL (ref 1.5–6.5)
NEUT%: 66.4 % (ref 38.4–76.8)
Platelets: 169 10*3/uL (ref 145–400)
RBC: 3.64 10*6/uL — ABNORMAL LOW (ref 3.70–5.45)
RDW: 14.3 % (ref 11.2–14.5)
WBC: 5.1 10*3/uL (ref 3.9–10.3)
lymph#: 1.1 10*3/uL (ref 0.9–3.3)

## 2013-06-15 LAB — COMPREHENSIVE METABOLIC PANEL (CC13)
ALT: 8 U/L (ref 0–55)
AST: 16 U/L (ref 5–34)
Albumin: 3.8 g/dL (ref 3.5–5.0)
Alkaline Phosphatase: 38 U/L — ABNORMAL LOW (ref 40–150)
BUN: 17.1 mg/dL (ref 7.0–26.0)
CO2: 26 mEq/L (ref 22–29)
Calcium: 9 mg/dL (ref 8.4–10.4)
Chloride: 109 mEq/L (ref 98–109)
Creatinine: 0.8 mg/dL (ref 0.6–1.1)
Glucose: 141 mg/dl — ABNORMAL HIGH (ref 70–140)
Potassium: 4.6 mEq/L (ref 3.5–5.1)
Sodium: 143 mEq/L (ref 136–145)
Total Bilirubin: 0.52 mg/dL (ref 0.20–1.20)
Total Protein: 6.7 g/dL (ref 6.4–8.3)

## 2013-06-22 ENCOUNTER — Ambulatory Visit (HOSPITAL_BASED_OUTPATIENT_CLINIC_OR_DEPARTMENT_OTHER): Payer: Medicare Other | Admitting: Oncology

## 2013-06-22 ENCOUNTER — Encounter: Payer: Self-pay | Admitting: *Deleted

## 2013-06-22 ENCOUNTER — Telehealth: Payer: Self-pay | Admitting: *Deleted

## 2013-06-22 ENCOUNTER — Other Ambulatory Visit: Payer: Medicare Other | Admitting: Lab

## 2013-06-22 VITALS — BP 148/80 | HR 87 | Temp 87.0°F | Resp 20 | Ht 62.5 in | Wt 173.5 lb

## 2013-06-22 DIAGNOSIS — C50919 Malignant neoplasm of unspecified site of unspecified female breast: Secondary | ICD-10-CM

## 2013-06-22 NOTE — Progress Notes (Signed)
06/22/13 at 1:31pm - NSABP B-42 - month 60/ EOT visit - The pt was into the cancer center this am for her EOT visit (month 60 visit).  The pt reported that she took her last dose of study drug this am.  She returned all of her study drug bottles.  She returned bottles 16, 17, 19 and 20.  Bottle 16 had 11 pills remaining in the bottle.  Bottle 20 had 45 remaining pills inside.  Bottles 17 and 19 were empty.  The pt reports that she has taken her study drug 100% of the time since her last follow up visit.  She specifically denies any bone fractures.  She also denies beginning any new medications for bone health.  The pt reports that she is feeling well, and she specifically denies any new health problems.  She denies any adverse events related to the study drug.  The pt is very active.  She does water aerobics 6 times a week, and she still delivers mobile meals.  ECOG=0.  The pt was seen and examined today by Dr. Jana Hakim.  The pt's last total cholesterol was within normal limits.  The pt also has had some cardiac testing done, her echo revealed a normal EF of 66%.  The pt states that her MD was very pleased with her arterial studies and her cardiac tests.  The pt has not been hospitalized since her last follow up visit.  The pt was informed that the research nurse will have to contact her in 6 months for the final adverse event assessment.

## 2013-06-22 NOTE — Progress Notes (Signed)
ID: Mallory Thomas   DOB: 11/08/1932  MR#: [id number]  [id number]  PCP: Jani Gravel, MD GYN: SU:  OTHER MD: Christen Butter   HISTORY OF PRESENT ILLNESS: Screening mammogram at [facility] showed a suspicious lesion in her right breast.  She was then referred to the [facility] where ultrasound was performed and core needle biopsy was obtained.  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 Annamaria Boots for further evaluation.  On 04/30/03, she underwent right lumpectomy with axillary lymph node biopsy and the final pathology [id number]) showed an infiltrating ductal carcinoma 0.9 cm on this study, with one sentinel lymph node negative for spread of disease.  Grade was 2.  There was no evidence of vascular or lymphatic invasion.  There was no extensive intraductal component.  Note that the patient's core biopsy which measured 1.2 cm, although that is not stated in the original pathology report, is being used as the "T" portion of the patient's staging record.  INTERVAL HISTORY: Mallory Thomas returns today for followup of her breast cancer. Since her last visit here she had extensive cardiology evaluation under Dr. Adrian Prows, separately scannned. "Everything was fine."  REVIEW OF SYSTEMS: She continues to be very active in her church. She exercises regularly, especially doing water Zumba. She has tolerated the study medication, whether letrozole or placebo, with no side effects that she is aware of. A detailed review of systems was otherwise noncontributory  CARDIAC RISK FACTORS: The patient has a history of well-controlled diabetes and hypertension, controlled hypertriglyceridemia and a positive family history for heart disease. Recent evaluation showed no evidence of peripheral vascular disease  PAST MEDICAL HISTORY: Past Medical History  Diagnosis Date  . Breast cancer 2003/03/09    right  . Diabetes mellitus   .  Heart murmur   . Hyperlipidemia   . Hypertension   history cat scratch disease Squamous cell skin cancers (Dr Lindwood Coke)  PAST SURGICAL HISTORY: Past Surgical History  Procedure Laterality Date  . Bilateral knee arthroscopy    . Lymph node removal right arm    . Breast lumpectomy  2003-03-09    right    FAMILY HISTORY The patient's family were [employer].  Her father died at the age of 26 from a heart attack.  Her mother died at age 44, and she had a breast cancer diagnosed at age 47.  She had a mastectomy and Tamoxifen at that time.   GYNECOLOGIC HISTORY: She is G3, F3, P0, A0, L3.  Menopause age 58.  She never took hormones.  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  Her husband died in the year 1999/03/09.  He had lung cancer and was taken care of by Dr. Ralene Ok.  Her children are Heidi (who lives in [place] and sells medications to labs); Shanon Brow (who lives in [place] and is in [employer]); and Medical sales representative (who lives in [place] and is currently a homemaker.).  The patient has two grandsons: Barnabas Lister, 70; case, 14; and Nicholas; 8.  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  She lives alone with her cat Barth Kirks ADVANCED DIRECTIVES:  HEALTH MAINTENANCE: History  Substance Use Topics  . Smoking status: Former Smoker    Quit date: 06/04/1968  . Smokeless tobacco: Not on file  . Alcohol Use: Not on file     Comment: occasional  Colonoscopy:  PAP:  Bone density:  Lipid panel:  Allergies  Allergen Reactions  . Shellfish Allergy Hives  . Tape     Current Outpatient Prescriptions  Medication Sig Dispense Refill  . aspirin 81 MG tablet Take 81 mg by mouth daily.        . Calcium Carbonate (CALCIUM 600 PO) Take by mouth daily.        . Cholecalciferol (VITAMIN D) 2000 UNITS CAPS Take by mouth daily.        Marland Kitchen ezetimibe (ZETIA) 10 MG tablet Take 10 mg by mouth daily.        .  Glucosamine-Chondroitin-Vit D3 1500-1200-800 MG-MG-UNIT PACK Take by mouth daily.        . Investigational letrozole/placebo 2.5MG  tablet NSABP B-42 Take 2.5 mg by mouth daily.        . Investigational letrozole/placebo tablet NSABP B-42 Take 1 tablet by mouth daily.  220 tablet  0  . Investigational letrozole/placebo tablet NSABP B-42 Take 1 tablet by mouth daily.  220 tablet  0  . Investigational letrozole/placebo tablet NSABP B-42 Take 1 tablet by mouth daily.  220 tablet  0  . irbesartan (AVAPRO) 300 MG tablet Take 300 mg by mouth at bedtime.      . rosuvastatin (CRESTOR) 20 MG tablet Take 20 mg by mouth daily.        . Saxagliptin-Metformin 2.03-999 MG TB24 Take 2 tablets by mouth.       No current facility-administered medications for this visit.    OBJECTIVE: Older white woman who appears younger than stated age 3 Vitals:   06/22/13 1151  BP: 148/80  Pulse: 87  Temp: 87 F (30.6 C)  Resp: 20     Body mass index is 31.21 kg/(m^2).    ECOG FS: 0  Sclerae unicteric, pupils equal round and reactive to light Oropharynx clear No cervical or supraclavicular adenopathy Lungs no rales or rhonchi Heart regular rate and rhythm Abd soft, nontender, positive bowel sounds MSK no focal spinal tenderness, no peripheral edema Neuro: nonfocal, well oriented, playful affect Breasts: The right breast is status post lumpectomy. There is no evidence of local recurrence. The right axilla is benign Left breast is unremarkable.  LAB RESULTS: Lab Results  Component Value Date   WBC 5.1 06/15/2013   NEUTROABS 3.4 06/15/2013   HGB 11.5* 06/15/2013   HCT 34.3* 06/15/2013   MCV 94.3 06/15/2013   PLT 169 06/15/2013      Chemistry      Component Value Date/Time   NA 143 06/15/2013 1018   NA 141 05/25/2012 0902   K 4.6 06/15/2013 1018   K 4.3 05/25/2012 0902   CL 106 05/25/2012 0902   CO2 26 06/15/2013 1018   CO2 27 05/25/2012 0902   BUN 17.1 06/15/2013 1018   BUN 20 05/25/2012 0902   CREATININE  0.8 06/15/2013 1018   CREATININE 0.85 05/25/2012 0902      Component Value Date/Time   CALCIUM 9.0 06/15/2013 1018   CALCIUM 9.1 05/25/2012 0902   ALKPHOS 38* 06/15/2013 1018   ALKPHOS 32* 05/25/2012 0902   AST 16 06/15/2013 1018   AST 16 05/25/2012 0902   ALT 8 06/15/2013 1018   ALT <8 05/25/2012 0902   BILITOT 0.52 06/15/2013 1018   BILITOT 0.5 05/25/2012 0902       Lab Results  Component Value Date   LABCA2 13 05/17/2008    No components found with this basename: [id number]    No results  found for this basename: INR,  in the last 168 hours  Urinalysis No results found for this basename: colorurine,  appearanceur,  labspec,  phurine,  glucoseu,  hgbur,  bilirubinur,  ketonesur,  proteinur,  urobilinogen,  nitrite,  leukocytesur    STUDIES: [facility] was unremarkable  ASSESSMENT: 79 y.o. [place] woman status post right lumpectomy and sentinel lymph node biopsy June 2004 for a 1.2 cm estrogen and progesterone receptor positive, HER2/neu negative, grade 2 invasive ductal carcinoma, lymph node negative, on Arimidex between July 2004 and July 2009 through the MA27 study, at which point she was enrolled in the B42 study and is either receiving placebo or letrozole.   PLAN: Mallory Thomas is doing fine from a breast cancer point of view. She has completed her 60 month followup under the B-42 study. We still don't know if she received letrozole or placebo but in either case she had no side effects from the medication that she was aware of. We're going to continue to follow her on a yearly basis, but she knows to call for any problems that may develop before next visit here.   Inge Waldroup C    06/23/2013

## 2013-06-22 NOTE — Telephone Encounter (Signed)
sw pt gv appt d/t for 06/25/14 @ 11 for labs, and 07/02/14@ 12noon for ov. Pt is aware...td

## 2013-06-29 ENCOUNTER — Ambulatory Visit: Payer: Medicare Other | Admitting: Oncology

## 2013-12-19 ENCOUNTER — Telehealth: Payer: Self-pay | Admitting: *Deleted

## 2013-12-19 NOTE — Telephone Encounter (Signed)
12/19/13 at 2:37pm - post-therapy AE follow up- The research nurse called the pt to check on her status since the pt discontinued her study drug (letrozole or placebo) on 06/22/13.  The pt reports she has been doing "great".  The pt denies being hospitalized in the last 6 months.  She states she has been in her "usual good state of health".  She denies any new adverse events since discontinuing her study drug.  She states that her bone density on 09/13/13 was "normal".  She states that she is performing all of her usual activities.  She reports that she is still doing water aerobics 5 times a week and volunteering for mobile meals on Mondays. The pt said that her last cholesterol test was "normal".  She also states that her other labs were "fine".  The pt said that her annual mammogram is due soon in March.  She said that she will schedule this appointment this week.  The pt was thanked for her continue support of this trial.  The pt's next follow up appointment is due in August 2015.

## 2013-12-29 ENCOUNTER — Other Ambulatory Visit: Payer: Self-pay

## 2013-12-29 DIAGNOSIS — Z1231 Encounter for screening mammogram for malignant neoplasm of breast: Secondary | ICD-10-CM

## 2014-01-19 ENCOUNTER — Ambulatory Visit
Admission: RE | Admit: 2014-01-19 | Discharge: 2014-01-19 | Disposition: A | Discharge status: Home / Self Care | Payer: Medicare Other | Source: Ambulatory Visit

## 2014-01-19 DIAGNOSIS — Z1231 Encounter for screening mammogram for malignant neoplasm of breast: Secondary | ICD-10-CM

## 2014-05-13 ENCOUNTER — Encounter (HOSPITAL_COMMUNITY): Payer: Self-pay | Admitting: Emergency Medicine

## 2014-05-13 ENCOUNTER — Emergency Department (HOSPITAL_COMMUNITY)
Admission: EM | Admit: 2014-05-13 | Discharge: 2014-05-13 | Disposition: A | Discharge status: Home / Self Care | Payer: Medicare Other | Source: Home / Self Care | Attending: Emergency Medicine | Admitting: Emergency Medicine

## 2014-05-13 DIAGNOSIS — T814XXA Infection following a procedure, initial encounter: Principal | ICD-10-CM

## 2014-05-13 DIAGNOSIS — T8140XA Infection following a procedure, unspecified, initial encounter: Secondary | ICD-10-CM

## 2014-05-13 DIAGNOSIS — IMO0001 Reserved for inherently not codable concepts without codable children: Secondary | ICD-10-CM

## 2014-05-13 MED ORDER — CEPHALEXIN 500 MG PO CAPS: 500.0000 mg | ORAL_CAPSULE | Freq: Four times a day (QID) | ORAL | Status: DC

## 2014-05-13 NOTE — ED Provider Notes (Signed)
CSN: [id number]     Arrival date & time 05/13/14  [id number] History   First MD Initiated Contact with Patient 05/13/14 1015     Chief Complaint  Patient presents with  . Wound Check    Patient is a 80 y.o. female presenting with wound check. The history is provided by the patient.  Wound Check This is a new problem. The current episode started 2 days ago. The problem occurs constantly. The problem has been gradually worsening. Nothing aggravates the symptoms. Nothing relieves the symptoms.  Pt reports having several moles "burned off" this past Wednesday. The wounds first noted w/ blister like formation over the sites that ultimately ruptured c/w this procedure in the past. All the other sites have done well. The site to her LLE has become reddened and TTP over the last 2-3 days after attending a water aerobics class which her dermatologist said would be OK to do. It was the larger mole and required more extensive "burning" than the other sites. Pt concerned for early wound infection. Denies fever.   Past Medical History  Diagnosis Date  . Breast cancer 2004    right  . Diabetes mellitus   . Heart murmur   . Hyperlipidemia   . Hypertension    Past Surgical History  Procedure Laterality Date  . Bilateral knee arthroscopy    . Lymph node removal right arm    . Breast lumpectomy  2004    right   History reviewed. No pertinent family history. History  Substance Use Topics  . Smoking status: Former Smoker    Quit date: 06/04/1968  . Smokeless tobacco: Not on file  . Alcohol Use: Not on file     Comment: occasional   OB History   Grav Para Term Preterm Abortions TAB SAB Ect Mult Living                 Review of Systems  All other systems reviewed and are negative.   Allergies  Shellfish allergy and Tape  Home Medications   Prior to Admission medications   Medication Sig Start Date End Date Taking? Authorizing Provider  aspirin 81 MG tablet Take 81 mg by mouth daily.     Yes  Historical Provider, MD  Calcium Carbonate (CALCIUM 600 PO) Take by mouth daily.     Yes Historical Provider, MD  Cholecalciferol (VITAMIN D) 2000 UNITS CAPS Take by mouth daily.     Yes Historical Provider, MD  ezetimibe (ZETIA) 10 MG tablet Take 10 mg by mouth daily.     Yes Historical Provider, MD  Glucosamine-Chondroitin-Vit D3 1500-1200-800 MG-MG-UNIT PACK Take by mouth daily.     Yes Historical Provider, MD  Investigational letrozole/placebo 2.5MG  tablet NSABP B-42 Take 2.5 mg by mouth daily.   06/23/08  Yes Historical Provider, MD  Investigational letrozole/placebo tablet NSABP B-42 Take 1 tablet by mouth daily. 05/25/12  Yes Mallory Cruel, MD  Investigational letrozole/placebo tablet NSABP B-42 Take 1 tablet by mouth daily. 06/01/12  Yes Mallory Cruel, MD  Investigational letrozole/placebo tablet NSABP B-42 Take 1 tablet by mouth daily. 12/19/12  Yes Mallory Cruel, MD  irbesartan (AVAPRO) 300 MG tablet Take 300 mg by mouth at bedtime.   Yes Historical Provider, MD  rosuvastatin (CRESTOR) 20 MG tablet Take 20 mg by mouth daily.     Yes Historical Provider, MD  Saxagliptin-Metformin 2.03-999 MG TB24 Take 2 tablets by mouth.   Yes Historical Provider, MD  cephALEXin (KEFLEX) 500 MG  capsule Take 1 capsule (500 mg total) by mouth 4 (four) times daily. For seven days 05/13/14   Mallory Mura Brydon Spahr, NP   BP 189/119  Pulse 109  Temp(Src) 98.2 F (36.8 C) (Oral)  Resp 18  SpO2 99% Physical Exam  Nursing note and vitals reviewed. Constitutional: She is oriented to person, place, and time. She appears well-developed and well-nourished.  HENT:  Head: Normocephalic.  Eyes: Conjunctivae are normal.  Cardiovascular: Normal rate.   Pt hypertensive. 189/119 then 190/90  Pulmonary/Chest: Effort normal.  Musculoskeletal:       Legs: Neurological: She is alert and oriented to person, place, and time.  Skin: Skin is warm and dry.  Psychiatric: She has a normal mood and affect.    ED Course   Procedures (including critical care time) Labs Review Labs Reviewed - No data to display  Imaging Review No results found.   MDM   1. Wound infection after surgery, initial encounter      Early wound infection s/p cautery procedure to remove mole to LLE. Keflex 500 mg PO QID x 7 days.  Pt to arrange f/u w/ dermatologist if not improving.  Mallory Columbia, NP 05/13/14 1058

## 2014-05-13 NOTE — ED Notes (Signed)
Reports having a mole removed from left lower leg.  States area developed a blister that burst open.  Having mild pain and tenderness at site of mole removal.  Pt has kept area cleaned and covered.

## 2014-05-13 NOTE — Discharge Instructions (Signed)
Arrange follow up with Dr Rozann Lesches if not improving or worsening.   Wound Infection A wound infection happens when a type of germ (bacteria) starts growing in the wound. In some cases, this can cause the wound to break open. If cared for properly, the infected wound will heal from the inside to the outside. Wound infections need treatment. CAUSES An infection is caused by bacteria growing in the wound.  SYMPTOMS   Increase in redness, swelling, or pain at the wound site.  Increase in drainage at the wound site.  Wound or bandage (dressing) starts to smell bad.  Fever.  Feeling tired or fatigued.  Pus draining from the wound. TREATMENT  Your health care provider will prescribe antibiotic medicine. The wound infection should improve within 24 to 48 hours. Any redness around the wound should stop spreading and the wound should be less painful.  HOME CARE INSTRUCTIONS   Only take over-the-counter or prescription medicines for pain, discomfort, or fever as directed by your health care provider.  Take your antibiotics as directed. Finish them even if you start to feel better.  Gently wash the area with mild soap and water 2 times a day, or as directed. Rinse off the soap. Pat the area dry with a clean towel. Do not rub the wound. This may cause bleeding.  Follow your health care provider's instructions for how often you need to change the dressing.  Apply ointment and a dressing to the wound as directed.  If the dressing sticks, moisten it with soapy water and gently remove it.  Change the bandage right away if it becomes wet, dirty, or develops a bad smell.  Take showers. Do not take tub baths, swim, or do anything that may soak the wound until it is healed.  Avoid exercises that make you sweat heavily.  Use anti-itch medicine as directed by your health care provider. The wound may itch when it is healing. Do not pick or scratch at the wound.  Follow up with your health care  provider to get your wound rechecked as directed. SEEK MEDICAL CARE IF:  You have an increase in swelling, pain, or redness around the wound.  You have an increase in the amount of pus coming from the wound.  There is a bad smell coming from the wound.  More of the wound breaks open.  You have a fever. MAKE SURE YOU:   Understand these instructions.  Will watch your condition.  Will get help right away if you are not doing well or get worse. Document Released: 08/01/2003 Document Revised: 11/07/2013 Document Reviewed: 03/08/2011 [facility] Patient Information 2015 [place], [place]. This information is not intended to replace advice given to you by your health care provider. Make sure you discuss any questions you have with your health care provider.

## 2014-05-14 NOTE — ED Provider Notes (Signed)
Medical screening examination/treatment/procedure(s) were performed by non-physician practitioner and as supervising physician I was immediately available for consultation/collaboration.  Philipp Deputy, M.D.  Harden Mo, MD 05/14/14 [phone]

## 2014-06-22 ENCOUNTER — Other Ambulatory Visit: Payer: Self-pay | Admitting: *Deleted

## 2014-06-22 DIAGNOSIS — C50919 Malignant neoplasm of unspecified site of unspecified female breast: Secondary | ICD-10-CM

## 2014-06-25 ENCOUNTER — Other Ambulatory Visit (HOSPITAL_BASED_OUTPATIENT_CLINIC_OR_DEPARTMENT_OTHER): Payer: Medicare Other

## 2014-06-25 ENCOUNTER — Telehealth: Payer: Self-pay | Admitting: Oncology

## 2014-06-25 DIAGNOSIS — C50919 Malignant neoplasm of unspecified site of unspecified female breast: Secondary | ICD-10-CM

## 2014-06-25 LAB — COMPREHENSIVE METABOLIC PANEL
ALT: 7 U/L (ref 0–35)
AST: 18 U/L (ref 0–37)
Albumin: 4 g/dL (ref 3.5–5.2)
Alkaline Phosphatase: 37 U/L — ABNORMAL LOW (ref 39–117)
BUN: 24 mg/dL — ABNORMAL HIGH (ref 6–23)
CO2: 25 mEq/L (ref 19–32)
Calcium: 9.5 mg/dL (ref 8.4–10.5)
Chloride: 105 mEq/L (ref 96–112)
Creatinine, Ser: 0.92 mg/dL (ref 0.50–1.10)
Glucose, Bld: 126 mg/dL — ABNORMAL HIGH (ref 70–99)
Potassium: 5.8 mEq/L — ABNORMAL HIGH (ref 3.5–5.3)
Sodium: 142 mEq/L (ref 135–145)
Total Bilirubin: 0.4 mg/dL (ref 0.2–1.2)
Total Protein: 6.9 g/dL (ref 6.0–8.3)

## 2014-06-25 LAB — CBC WITH DIFFERENTIAL/PLATELET
BASO%: 0.5 % (ref 0.0–2.0)
Basophils Absolute: 0 10*3/uL (ref 0.0–0.1)
EOS%: 3.1 % (ref 0.0–7.0)
Eosinophils Absolute: 0.1 10*3/uL (ref 0.0–0.5)
HCT: 35 % (ref 34.8–46.6)
HGB: 11.4 g/dL — ABNORMAL LOW (ref 11.6–15.9)
LYMPH%: 23.4 % (ref 14.0–49.7)
MCH: 31.2 pg (ref 25.1–34.0)
MCHC: 32.6 g/dL (ref 31.5–36.0)
MCV: 95.9 fL (ref 79.5–101.0)
MONO#: 0.4 10*3/uL (ref 0.1–0.9)
MONO%: 9 % (ref 0.0–14.0)
NEUT#: 2.7 10*3/uL (ref 1.5–6.5)
NEUT%: 64 % (ref 38.4–76.8)
Platelets: 172 10*3/uL (ref 145–400)
RBC: 3.65 10*6/uL — ABNORMAL LOW (ref 3.70–5.45)
RDW: 14.1 % (ref 11.2–14.5)
WBC: 4.2 10*3/uL (ref 3.9–10.3)
lymph#: 1 10*3/uL (ref 0.9–3.3)

## 2014-06-25 NOTE — Telephone Encounter (Signed)
Per pt cld to r/s appt-r/s and pt aware of new time & date

## 2014-07-02 ENCOUNTER — Ambulatory Visit: Payer: Medicare Other | Admitting: Oncology

## 2014-07-03 ENCOUNTER — Telehealth: Payer: Self-pay | Admitting: Oncology

## 2014-07-03 ENCOUNTER — Ambulatory Visit (HOSPITAL_BASED_OUTPATIENT_CLINIC_OR_DEPARTMENT_OTHER): Payer: Medicare Other | Admitting: Oncology

## 2014-07-03 VITALS — BP 161/88 | HR 101 | Temp 98.3°F | Resp 20 | Ht 62.0 in | Wt 173.4 lb

## 2014-07-03 DIAGNOSIS — I10 Essential (primary) hypertension: Secondary | ICD-10-CM

## 2014-07-03 DIAGNOSIS — C50919 Malignant neoplasm of unspecified site of unspecified female breast: Secondary | ICD-10-CM

## 2014-07-03 DIAGNOSIS — C50911 Malignant neoplasm of unspecified site of right female breast: Secondary | ICD-10-CM | POA: Insufficient documentation

## 2014-07-03 DIAGNOSIS — Z8249 Family history of ischemic heart disease and other diseases of the circulatory system: Secondary | ICD-10-CM

## 2014-07-03 DIAGNOSIS — E119 Type 2 diabetes mellitus without complications: Secondary | ICD-10-CM

## 2014-07-03 DIAGNOSIS — Z170 Estrogen receptor positive status [ER+]: Secondary | ICD-10-CM

## 2014-07-03 NOTE — Progress Notes (Signed)
ID: Mallory Thomas   DOB: [birth year]  MR#: [id number]  SJG#:[id number]  PCP: Jani Gravel, MD GYN: SU:  OTHER MD: Christen Butter   HISTORY OF PRESENT ILLNESS: From the original intake note:  Screening mammogram at [facility] showed a suspicious lesion in her right breast.  She was then referred to the [place] where ultrasound was performed and core needle biopsy was obtained.  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 Annamaria Boots for further evaluation.  On 04/30/03, she underwent right lumpectomy with axillary lymph node biopsy and the final pathology ([id number]) showed an infiltrating ductal carcinoma 0.9 cm on this study, with one sentinel lymph node negative for spread of disease.  Grade was 2.  There was no evidence of vascular or lymphatic invasion.  There was no extensive intraductal component.  Note that the patient's core biopsy which measured 1.2 cm, although that is not stated in the original pathology report, is being used as the "T" portion of the patient's staging record.  Her subsequent history is as detailed below.  INTERVAL HISTORY: Mallory Thomas returns today for followup of her breast cancer. She continues on letrozole or placebo according to the B-42 study. She reports no side effects that she is aware of from this medication.  REVIEW OF SYSTEMS: She continues to go to water aerobics regularly. She developed some skin lesions, which have resolved, after prolonged antibiotic dosing. She is very concerned because she is about to hit the "doughnut hole", and will not be able to afford some of the medications on her list. Aside from this a detailed review of systems today was entirely negative.  CARDIAC RISK FACTORS: The patient has a history of well-controlled diabetes and hypertension, controlled hypertriglyceridemia and a positive family history for heart disease. Recent evaluation showed  no evidence of peripheral vascular disease  PAST MEDICAL HISTORY: Past Medical History  Diagnosis Date  . Breast cancer 01-03-03    right  . Diabetes mellitus   . Heart murmur   . Hyperlipidemia   . Hypertension   history cat scratch disease Squamous cell skin cancers (Dr Lindwood Coke)  PAST SURGICAL HISTORY: Past Surgical History  Procedure Laterality Date  . Bilateral knee arthroscopy    . Lymph node removal right arm    . Breast lumpectomy  01/03/2003    right    FAMILY HISTORY The patient's family were [employer].  Her father died at the age of 40 from a heart attack.  Her mother died at age 66, and she had a breast cancer diagnosed at age 68.  She had a mastectomy and Tamoxifen at that time.   GYNECOLOGIC HISTORY: She is G3, F3, P0, A0, L3.  Menopause age 84.  She never took hormones.  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  Her husband died in the year 01/03/99.  He had lung cancer and was taken care of by Dr. Ralene Ok.  Her children are Heidi (who lives in [place] and sells medications to labs); Shanon Brow (who lives in [place] and is in [employer]); and Medical sales representative (who lives in [place] and is currently a homemaker.).  The patient has three grandsons: Barnabas Lister, 36; Case, 14; and Nicholas; 8.  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  She lives alone with her cat Barth Kirks   ADVANCED DIRECTIVES:  HEALTH MAINTENANCE:  History  Substance Use Topics  . Smoking status: Former Smoker    Quit date: 06/04/1968  . Smokeless tobacco: Not on file  . Alcohol Use: Not on file     Comment: occasional     Colonoscopy:  PAP:  Bone density:  Lipid panel:  Allergies  Allergen Reactions  . Shellfish Allergy Hives  . Tape     Current Outpatient Prescriptions  Medication Sig Dispense Refill  . aspirin 81 MG tablet Take 81 mg by mouth daily.        . Calcium Carbonate (CALCIUM 600 PO) Take by  mouth daily.        . cephALEXin (KEFLEX) 500 MG capsule Take 1 capsule (500 mg total) by mouth 4 (four) times daily. For seven days  28 capsule  0  . Cholecalciferol (VITAMIN D) 2000 UNITS CAPS Take by mouth daily.        Marland Kitchen ezetimibe (ZETIA) 10 MG tablet Take 10 mg by mouth daily.        . Glucosamine-Chondroitin-Vit D3 1500-1200-800 MG-MG-UNIT PACK Take by mouth daily.        . Investigational letrozole/placebo 2.5MG tablet NSABP B-42 Take 2.5 mg by mouth daily.        . Investigational letrozole/placebo tablet NSABP B-42 Take 1 tablet by mouth daily.  220 tablet  0  . Investigational letrozole/placebo tablet NSABP B-42 Take 1 tablet by mouth daily.  220 tablet  0  . Investigational letrozole/placebo tablet NSABP B-42 Take 1 tablet by mouth daily.  220 tablet  0  . irbesartan (AVAPRO) 300 MG tablet Take 300 mg by mouth at bedtime.      . rosuvastatin (CRESTOR) 20 MG tablet Take 20 mg by mouth daily.        . Saxagliptin-Metformin 2.03-999 MG TB24 Take 2 tablets by mouth.       No current facility-administered medications for this visit.    OBJECTIVE: Older white woman in no acute distress Filed Vitals:   07/03/14 1159  BP: 161/88  Pulse: 101  Temp: 98.3 F (36.8 C)  Resp: 20     Body mass index is 31.71 kg/(m^2).    ECOG FS: 0  Sclerae unicteric, EOMs intact Oropharynx clear and moist No cervical or supraclavicular adenopathy Lungs no rales or rhonchi Heart regular rate and rhythm, 3/6 systolic murmur unchanged Abd soft, nontender, positive bowel sounds MSK no focal spinal tenderness, no peripheral edema Neuro: nonfocal, well oriented, positive affect Breasts: The right breast is status post lumpectomy. There is no evidence of local recurrence. The right axilla is benign Left breast is unremarkable.  LAB RESULTS: Lab Results  Component Value Date   WBC 4.2 06/25/2014   NEUTROABS 2.7 06/25/2014   HGB 11.4* 06/25/2014   HCT 35.0 06/25/2014   MCV 95.9 06/25/2014   PLT 172  06/25/2014      Chemistry      Component Value Date/Time   NA 142 06/25/2014 1106   NA 143 06/15/2013 1018   K 5.8* 06/25/2014 1106   K 4.6 06/15/2013 1018   CL 105 06/25/2014 1106   CO2 25 06/25/2014 1106   CO2 26 06/15/2013 1018   BUN 24* 06/25/2014 1106   BUN 17.1 06/15/2013 1018   CREATININE 0.92 06/25/2014 1106   CREATININE 0.8 06/15/2013 1018      Component Value Date/Time   CALCIUM 9.5 06/25/2014 1106   CALCIUM 9.0 06/15/2013 1018   ALKPHOS 37* 06/25/2014 1106   ALKPHOS 38* 06/15/2013 1018  AST 18 06/25/2014 1106   AST 16 06/15/2013 1018   ALT 7 06/25/2014 1106   ALT 8 06/15/2013 1018   BILITOT 0.4 06/25/2014 1106   BILITOT 0.52 06/15/2013 1018       Lab Results  Component Value Date   LABCA2 13 05/17/2008    No components found with this basename: LABCA125    No results found for this basename: INR,  in the last 168 hours  Urinalysis No results found for this basename: colorurine,  appearanceur,  labspec,  phurine,  glucoseu,  hgbur,  bilirubinur,  ketonesur,  proteinur,  urobilinogen,  nitrite,  leukocytesur    STUDIES: DIGITAL SCREENING BILATERAL MAMMOGRAM WITH CAD  COMPARISON: Previous exam(s).  ACR Breast Density Category b: There are scattered areas of  fibroglandular density.  FINDINGS:  There are no findings suspicious for malignancy. Images were  processed with CAD.  IMPRESSION:  No mammographic evidence of malignancy. A result letter of this  screening mammogram will be mailed directly to the patient.  RECOMMENDATION:  Screening mammogram in one year. (Code:SM-B-01Y)  BI-RADS CATEGORY 1: Negative.  Electronically Signed  By: Abelardo Diesel M.D.  On: 01/19/2014 14:   ASSESSMENT: 80 y.o. [place] woman status post right lumpectomy and sentinel lymph node biopsy June 2004 for a 1.2 cm estrogen and progesterone receptor positive, HER2/neu negative, grade 2 invasive ductal carcinoma, lymph node negative,  (1) on Arimidex between July 2004 and July 2009 through  the MA27 study,  (2) enrolled in the B42 study as of July 2009 and is either receiving placebo or letrozole.   (3) normal bone density 09/13/2013 at [facility].  PLAN: Mallory Thomas is tolerating the letrozole or placebo with no side effects it she is aware of, and she has an excellent bone density. We are going to continue to follow her on a once a year basis.  She is getting into financial difficulties and some of her medications are expensive. She is going to be checking with Dr. Maudie Mercury to see if he can come up with alternatives to the ezetimibe and saxagliptin-metformin.  Otherwise she is doing terrific and I have encouraged her to continue her exercise program. She knows to call for any problems that may develop before her next visit here.   Axel Frisk C    07/03/2014

## 2014-07-03 NOTE — Telephone Encounter (Signed)
per pof to sch pt appt-sch & gave pt copy of sch °

## 2014-07-03 NOTE — Addendum Note (Signed)
Addended by: Amelia Jo I on: 07/03/2014 03:06 PM   Modules accepted: Orders

## 2014-09-12 ENCOUNTER — Other Ambulatory Visit: Payer: Self-pay | Admitting: Nurse Practitioner

## 2014-11-26 DIAGNOSIS — E118 Type 2 diabetes mellitus with unspecified complications: Secondary | ICD-10-CM | POA: Diagnosis not present

## 2014-11-26 DIAGNOSIS — I10 Essential (primary) hypertension: Secondary | ICD-10-CM | POA: Diagnosis not present

## 2014-11-26 DIAGNOSIS — E780 Pure hypercholesterolemia: Secondary | ICD-10-CM | POA: Diagnosis not present

## 2014-12-28 ENCOUNTER — Other Ambulatory Visit: Payer: Self-pay

## 2014-12-28 DIAGNOSIS — Z1231 Encounter for screening mammogram for malignant neoplasm of breast: Secondary | ICD-10-CM

## 2015-01-23 ENCOUNTER — Ambulatory Visit
Admission: RE | Admit: 2015-01-23 | Discharge: 2015-01-23 | Disposition: A | Discharge status: Home / Self Care | Payer: Medicare Other | Source: Ambulatory Visit

## 2015-01-23 DIAGNOSIS — Z1231 Encounter for screening mammogram for malignant neoplasm of breast: Secondary | ICD-10-CM | POA: Diagnosis not present

## 2015-02-14 DIAGNOSIS — E139 Other specified diabetes mellitus without complications: Secondary | ICD-10-CM | POA: Diagnosis not present

## 2015-02-14 DIAGNOSIS — E780 Pure hypercholesterolemia: Secondary | ICD-10-CM | POA: Diagnosis not present

## 2015-02-14 DIAGNOSIS — I10 Essential (primary) hypertension: Secondary | ICD-10-CM | POA: Diagnosis not present

## 2015-02-14 DIAGNOSIS — E119 Type 2 diabetes mellitus without complications: Secondary | ICD-10-CM | POA: Diagnosis not present

## 2015-03-07 DIAGNOSIS — E119 Type 2 diabetes mellitus without complications: Secondary | ICD-10-CM | POA: Diagnosis not present

## 2015-03-07 DIAGNOSIS — Z961 Presence of intraocular lens: Secondary | ICD-10-CM | POA: Diagnosis not present

## 2015-03-14 DIAGNOSIS — I10 Essential (primary) hypertension: Secondary | ICD-10-CM | POA: Diagnosis not present

## 2015-03-14 DIAGNOSIS — E780 Pure hypercholesterolemia: Secondary | ICD-10-CM | POA: Diagnosis not present

## 2015-03-14 DIAGNOSIS — E139 Other specified diabetes mellitus without complications: Secondary | ICD-10-CM | POA: Diagnosis not present

## 2015-05-27 DIAGNOSIS — E139 Other specified diabetes mellitus without complications: Secondary | ICD-10-CM | POA: Diagnosis not present

## 2015-05-27 DIAGNOSIS — E780 Pure hypercholesterolemia: Secondary | ICD-10-CM | POA: Diagnosis not present

## 2015-05-27 DIAGNOSIS — I10 Essential (primary) hypertension: Secondary | ICD-10-CM | POA: Diagnosis not present

## 2015-05-30 DIAGNOSIS — Z0000 Encounter for general adult medical examination without abnormal findings: Secondary | ICD-10-CM | POA: Diagnosis not present

## 2015-05-30 DIAGNOSIS — R159 Full incontinence of feces: Secondary | ICD-10-CM | POA: Diagnosis not present

## 2015-05-30 DIAGNOSIS — I10 Essential (primary) hypertension: Secondary | ICD-10-CM | POA: Diagnosis not present

## 2015-05-30 DIAGNOSIS — E119 Type 2 diabetes mellitus without complications: Secondary | ICD-10-CM | POA: Diagnosis not present

## 2015-06-11 DIAGNOSIS — E139 Other specified diabetes mellitus without complications: Secondary | ICD-10-CM | POA: Diagnosis not present

## 2015-06-11 DIAGNOSIS — E780 Pure hypercholesterolemia: Secondary | ICD-10-CM | POA: Diagnosis not present

## 2015-06-11 DIAGNOSIS — I10 Essential (primary) hypertension: Secondary | ICD-10-CM | POA: Diagnosis not present

## 2015-06-13 DIAGNOSIS — E139 Other specified diabetes mellitus without complications: Secondary | ICD-10-CM | POA: Diagnosis not present

## 2015-06-13 DIAGNOSIS — I10 Essential (primary) hypertension: Secondary | ICD-10-CM | POA: Diagnosis not present

## 2015-06-13 DIAGNOSIS — E780 Pure hypercholesterolemia: Secondary | ICD-10-CM | POA: Diagnosis not present

## 2015-06-21 DIAGNOSIS — I350 Nonrheumatic aortic (valve) stenosis: Secondary | ICD-10-CM | POA: Diagnosis not present

## 2015-06-21 DIAGNOSIS — R0989 Other specified symptoms and signs involving the circulatory and respiratory systems: Secondary | ICD-10-CM | POA: Diagnosis not present

## 2015-06-21 DIAGNOSIS — E089 Diabetes mellitus due to underlying condition without complications: Secondary | ICD-10-CM | POA: Diagnosis not present

## 2015-06-21 DIAGNOSIS — Z6832 Body mass index (BMI) 32.0-32.9, adult: Secondary | ICD-10-CM | POA: Diagnosis not present

## 2015-07-02 ENCOUNTER — Other Ambulatory Visit (HOSPITAL_BASED_OUTPATIENT_CLINIC_OR_DEPARTMENT_OTHER): Payer: Medicare Other

## 2015-07-02 DIAGNOSIS — C50919 Malignant neoplasm of unspecified site of unspecified female breast: Secondary | ICD-10-CM

## 2015-07-02 DIAGNOSIS — C50511 Malignant neoplasm of lower-outer quadrant of right female breast: Secondary | ICD-10-CM | POA: Diagnosis not present

## 2015-07-02 LAB — CBC WITH DIFFERENTIAL/PLATELET
BASO%: 0.7 % (ref 0.0–2.0)
Basophils Absolute: 0 10*3/uL (ref 0.0–0.1)
EOS%: 4.8 % (ref 0.0–7.0)
Eosinophils Absolute: 0.2 10*3/uL (ref 0.0–0.5)
HCT: 37.3 % (ref 34.8–46.6)
HGB: 12.2 g/dL (ref 11.6–15.9)
LYMPH%: 30.8 % (ref 14.0–49.7)
MCH: 30.9 pg (ref 25.1–34.0)
MCHC: 32.8 g/dL (ref 31.5–36.0)
MCV: 94 fL (ref 79.5–101.0)
MONO#: 0.3 10*3/uL (ref 0.1–0.9)
MONO%: 9 % (ref 0.0–14.0)
NEUT#: 2.1 10*3/uL (ref 1.5–6.5)
NEUT%: 54.7 % (ref 38.4–76.8)
Platelets: 171 10*3/uL (ref 145–400)
RBC: 3.97 10*6/uL (ref 3.70–5.45)
RDW: 13.7 % (ref 11.2–14.5)
WBC: 3.9 10*3/uL (ref 3.9–10.3)
lymph#: 1.2 10*3/uL (ref 0.9–3.3)

## 2015-07-02 LAB — COMPREHENSIVE METABOLIC PANEL (CC13)
ALT: 11 U/L (ref 0–55)
AST: 16 U/L (ref 5–34)
Albumin: 4.1 g/dL (ref 3.5–5.0)
Alkaline Phosphatase: 54 U/L (ref 40–150)
Anion Gap: 8 mEq/L (ref 3–11)
BUN: 34.9 mg/dL — ABNORMAL HIGH (ref 7.0–26.0)
CO2: 24 mEq/L (ref 22–29)
Calcium: 9.8 mg/dL (ref 8.4–10.4)
Chloride: 109 mEq/L (ref 98–109)
Creatinine: 1.2 mg/dL — ABNORMAL HIGH (ref 0.6–1.1)
EGFR: 45 mL/min/{1.73_m2} — ABNORMAL LOW (ref >90)
Glucose: 142 mg/dl — ABNORMAL HIGH (ref 70–140)
Potassium: 5.3 mEq/L — ABNORMAL HIGH (ref 3.5–5.1)
Sodium: 142 mEq/L (ref 136–145)
Total Bilirubin: 0.57 mg/dL (ref 0.20–1.20)
Total Protein: 7 g/dL (ref 6.4–8.3)

## 2015-07-09 ENCOUNTER — Telehealth: Payer: Self-pay | Admitting: Oncology

## 2015-07-09 ENCOUNTER — Ambulatory Visit (HOSPITAL_BASED_OUTPATIENT_CLINIC_OR_DEPARTMENT_OTHER): Payer: Medicare Other | Admitting: Oncology

## 2015-07-09 VITALS — BP 104/50 | HR 96 | Temp 98.2°F | Resp 18 | Ht 62.0 in | Wt 181.4 lb

## 2015-07-09 DIAGNOSIS — N183 Chronic kidney disease, stage 3 unspecified: Secondary | ICD-10-CM | POA: Insufficient documentation

## 2015-07-09 DIAGNOSIS — Z853 Personal history of malignant neoplasm of breast: Secondary | ICD-10-CM

## 2015-07-09 DIAGNOSIS — Z170 Estrogen receptor positive status [ER+]: Secondary | ICD-10-CM | POA: Diagnosis not present

## 2015-07-09 DIAGNOSIS — C50911 Malignant neoplasm of unspecified site of right female breast: Secondary | ICD-10-CM

## 2015-07-09 NOTE — Telephone Encounter (Signed)
Gave patient avs report and appointments for August 2017. No pof sent to scheduling. F/u information obtained from on tx details attached to office note.

## 2015-07-09 NOTE — Progress Notes (Signed)
ID: Mallory Thomas   DOB: November 29, 1931  MR#: [id number]  PPI#:[id number]  PCP: Jani Gravel, MD GYN: SU:  OTHER MD: Christen Butter  CHIEF COMPLAINT: Right-sided breast cancer  CURRENT TREATMENT: Observation  HISTORY OF PRESENT ILLNESS: From the original intake note:  Screening mammogram at [facility] showed a suspicious lesion in her right breast.  She was then referred to the [place] where ultrasound was performed and core needle biopsy was obtained.  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 Annamaria Boots for further evaluation.  On 04/30/03, she underwent right lumpectomy with axillary lymph node biopsy and the final pathology ([id number]) showed an infiltrating ductal carcinoma 0.9 cm on this study, with one sentinel lymph node negative for spread of disease.  Grade was 2.  There was no evidence of vascular or lymphatic invasion.  There was no extensive intraductal component.  Note that the patient's core biopsy which measured 1.2 cm, although that is not stated in the original pathology report, is being used as the "T" portion of the patient's staging record.  Her subsequent history is as detailed below.  INTERVAL HISTORY: Mallory Thomas returns today for followup of her breast cancer. The interval history is unremarkable. She is now off study. 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 She goes to water aerobics regularly. She does a lot of sewing. A detailed review of systems today was otherwise noncontributory  CARDIAC RISK FACTORS: The patient has a history of well-controlled diabetes and hypertension, controlled hypertriglyceridemia and a positive family history for heart disease. Recent evaluation showed no evidence of peripheral vascular  disease. She follows Dr. Einar Gip regarding all this  PAST MEDICAL HISTORY: Past Medical History  Diagnosis Date  . Breast cancer 2003/02/02    right  . Diabetes mellitus   . Heart murmur   . Hyperlipidemia   . Hypertension   history cat scratch disease Squamous cell skin cancers (Dr Lindwood Coke)  PAST SURGICAL HISTORY: Past Surgical History  Procedure Laterality Date  . Bilateral knee arthroscopy    . Lymph node removal right arm    . Breast lumpectomy  02/02/03    right    FAMILY HISTORY The patient's family were [employer].  Her father died at the age of 64 from a heart attack.  Her mother died at age 19, and she had a breast cancer diagnosed at age 80.  She had a mastectomy and Tamoxifen at that time.   GYNECOLOGIC HISTORY: She is G3, F3, P0, A0, L3.  Menopause age 4.  She never took hormones.  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  Her husband died in the year 02/02/99.  He had lung cancer and was taken care of by Dr. Ralene Ok.  Her children are Heidi (who lives in [place] and sells medications to labs); Shanon Brow (who lives in [place] and is in [employer]); and Medical sales representative (who lives in [place] and is currently a homemaker.).  The patient has three grandsons: Barnabas Lister, 67; Case, 14; and Nicholas; 8.  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  She lives alone with her cat Barth Kirks   ADVANCED DIRECTIVES: In place  HEALTH  MAINTENANCE: Social History  Substance Use Topics  . Smoking status: Former Smoker    Quit date: 06/04/1968  . Smokeless tobacco: Not on file  . Alcohol Use: Not on file     Comment: occasional     Colonoscopy:  PAP:  Bone density: 2014  Lipid panel:  Allergies  Allergen Reactions  . Shellfish Allergy Hives  . Tape     Current Outpatient Prescriptions  Medication Sig Dispense Refill  . aspirin 81 MG tablet Take 81 mg by mouth daily.      . Calcium Carbonate  (CALCIUM 600 PO) Take by mouth daily.      . Cholecalciferol (VITAMIN D) 2000 UNITS CAPS Take by mouth daily.      Marland Kitchen ezetimibe (ZETIA) 10 MG tablet Take 10 mg by mouth daily.      . Glucosamine-Chondroitin-Vit D3 1500-1200-800 MG-MG-UNIT PACK Take by mouth daily.      . Investigational letrozole/placebo 2.5MG  tablet NSABP B-42 Take 2.5 mg by mouth daily.      . irbesartan (AVAPRO) 300 MG tablet Take 300 mg by mouth at bedtime.    . rosuvastatin (CRESTOR) 20 MG tablet Take 20 mg by mouth daily.      . Saxagliptin-Metformin 2.03-999 MG TB24 Take 2 tablets by mouth.     No current facility-administered medications for this visit.    OBJECTIVE: Older white woman who appears well Filed Vitals:   07/09/15 1337  BP: 104/50  Pulse:   Temp:   Resp:      Body mass index is 33.17 kg/(m^2).    ECOG FS: 0  Sclerae unicteric, EOMs intact Oropharynx clear, slightly dry No cervical or supraclavicular adenopathy Lungs no rales or rhonchi Heart regular rate and rhythm Abd soft, nontender, positive bowel sounds MSK no focal spinal tenderness, no upper extremity lymphedema Neuro: nonfocal, well oriented, appropriate affect Breasts: The right breast is status post lumpectomy. There is no evidence of local recurrence. The right axilla is benign. The left breast is unremarkable.    LAB RESULTS: Lab Results  Component Value Date   WBC 3.9 07/02/2015   NEUTROABS 2.1 07/02/2015   HGB 12.2 07/02/2015   HCT 37.3 07/02/2015   MCV 94.0 07/02/2015   PLT 171 07/02/2015      Chemistry      Component Value Date/Time   NA 142 07/02/2015 0758   NA 142 06/25/2014 1106   K 5.3* 07/02/2015 0758   K 5.8* 06/25/2014 1106   CL 105 06/25/2014 1106   CO2 24 07/02/2015 0758   CO2 25 06/25/2014 1106   BUN 34.9* 07/02/2015 0758   BUN 24* 06/25/2014 1106   CREATININE 1.2* 07/02/2015 0758   CREATININE 0.92 06/25/2014 1106      Component Value Date/Time   CALCIUM 9.8 07/02/2015 0758   CALCIUM 9.5  06/25/2014 1106   ALKPHOS 54 07/02/2015 0758   ALKPHOS 37* 06/25/2014 1106   AST 16 07/02/2015 0758   AST 18 06/25/2014 1106   ALT 11 07/02/2015 0758   ALT 7 06/25/2014 1106   BILITOT 0.57 07/02/2015 0758   BILITOT 0.4 06/25/2014 1106       Lab Results  Component Value Date   LABCA2 13 05/17/2008    No components found for: [id number]  No results for input(s): INR in the last 168 hours.  Urinalysis No results found for: COLORURINE  STUDIES:  DIGITAL SCREENING BILATERAL MAMMOGRAM WITH CAD  COMPARISON: Previous exam(s).  ACR Breast Density Category b: There are scattered  areas of fibroglandular density.  FINDINGS: There are no findings suspicious for malignancy. Images were processed with CAD.  IMPRESSION: No mammographic evidence of malignancy. A result letter of this screening mammogram will be mailed directly to the patient.  RECOMMENDATION: Screening mammogram in one year. (Code:SM-B-01Y)  BI-RADS CATEGORY 1: Negative.   Electronically Signed  By: Pamelia Hoit M.D.  On: 01/23/2015 15:32   ASSESSMENT: 81 y.o.  woman status post right lumpectomy and sentinel lymph node biopsy June 2004 for a 1.2 cm estrogen and progesterone receptor positive, HER2/neu negative, grade 2 invasive ductal carcinoma, lymph node negative,   (1) on Arimidex between July 2004 and July 2009 through the MA27 study, note that the study still is unblinded so we do not know if she took 5 or 10 years of aromatase inhibitors  (2) enrolled in the B42 study as of July 2009 and is either receiving placebo or letrozole.   (3) normal bone density 09/13/2013 at [facility].  (4) CKD stage III  PLAN:   Mallory Thomas is doing remarkably well now 12 years out from her definitive surgery with no evidence of disease recurrence.  She is off study, but her last bone density 2 years ago this October was normal. I do not feel strongly she needs repeat bone density studies  at this time.  I am more concerned about her renal function. It could be age and or diabetes but she now has chronic kidney disease stage III. She is not on any medicines that I can tell what clearly worsened this. My suggestion to her was that she drank 4 pints of fluid daily. Of course this will be followed through Dr. Julianne Rice office and also again when she sees me a year from now  She knows to call for any problems that may develop before that visit.  Meegan Shanafelt C    07/09/2015

## 2015-07-09 NOTE — Addendum Note (Signed)
Addended by: Chauncey Cruel on: 07/09/2015 02:44 PM   Modules accepted: Orders, Medications

## 2015-07-11 DIAGNOSIS — I350 Nonrheumatic aortic (valve) stenosis: Secondary | ICD-10-CM | POA: Diagnosis not present

## 2015-07-18 ENCOUNTER — Telehealth: Payer: Self-pay

## 2015-07-18 DIAGNOSIS — E119 Type 2 diabetes mellitus without complications: Secondary | ICD-10-CM | POA: Diagnosis not present

## 2015-07-18 NOTE — Telephone Encounter (Signed)
Faxed lab results from last visit 06/26/15 per patient's request to Dr. Leanne Chang at [facility].

## 2015-08-29 DIAGNOSIS — E119 Type 2 diabetes mellitus without complications: Secondary | ICD-10-CM | POA: Diagnosis not present

## 2015-08-29 DIAGNOSIS — I10 Essential (primary) hypertension: Secondary | ICD-10-CM | POA: Diagnosis not present

## 2015-09-05 DIAGNOSIS — I10 Essential (primary) hypertension: Secondary | ICD-10-CM | POA: Diagnosis not present

## 2015-09-05 DIAGNOSIS — Z23 Encounter for immunization: Secondary | ICD-10-CM | POA: Diagnosis not present

## 2015-09-05 DIAGNOSIS — E119 Type 2 diabetes mellitus without complications: Secondary | ICD-10-CM | POA: Diagnosis not present

## 2015-09-05 DIAGNOSIS — E7800 Pure hypercholesterolemia, unspecified: Secondary | ICD-10-CM | POA: Diagnosis not present

## 2015-09-30 DIAGNOSIS — M7061 Trochanteric bursitis, right hip: Secondary | ICD-10-CM | POA: Diagnosis not present

## 2015-09-30 DIAGNOSIS — M7062 Trochanteric bursitis, left hip: Secondary | ICD-10-CM | POA: Diagnosis not present

## 2015-10-08 DIAGNOSIS — M6281 Muscle weakness (generalized): Secondary | ICD-10-CM | POA: Diagnosis not present

## 2015-10-14 DIAGNOSIS — M6281 Muscle weakness (generalized): Secondary | ICD-10-CM | POA: Diagnosis not present

## 2015-10-16 DIAGNOSIS — M6281 Muscle weakness (generalized): Secondary | ICD-10-CM | POA: Diagnosis not present

## 2015-10-17 DIAGNOSIS — R109 Unspecified abdominal pain: Secondary | ICD-10-CM | POA: Diagnosis not present

## 2015-10-18 DIAGNOSIS — R29898 Other symptoms and signs involving the musculoskeletal system: Secondary | ICD-10-CM | POA: Diagnosis not present

## 2015-10-18 DIAGNOSIS — M7061 Trochanteric bursitis, right hip: Secondary | ICD-10-CM | POA: Diagnosis not present

## 2015-10-18 DIAGNOSIS — M7062 Trochanteric bursitis, left hip: Secondary | ICD-10-CM | POA: Diagnosis not present

## 2015-10-22 DIAGNOSIS — M6281 Muscle weakness (generalized): Secondary | ICD-10-CM | POA: Diagnosis not present

## 2015-10-25 DIAGNOSIS — M6281 Muscle weakness (generalized): Secondary | ICD-10-CM | POA: Diagnosis not present

## 2015-10-30 DIAGNOSIS — M6281 Muscle weakness (generalized): Secondary | ICD-10-CM | POA: Diagnosis not present

## 2015-10-31 DIAGNOSIS — A09 Infectious gastroenteritis and colitis, unspecified: Secondary | ICD-10-CM | POA: Diagnosis not present

## 2015-10-31 DIAGNOSIS — R159 Full incontinence of feces: Secondary | ICD-10-CM | POA: Diagnosis not present

## 2015-11-01 ENCOUNTER — Other Ambulatory Visit: Payer: Self-pay | Admitting: Gastroenterology

## 2015-11-01 DIAGNOSIS — K5730 Diverticulosis of large intestine without perforation or abscess without bleeding: Secondary | ICD-10-CM | POA: Diagnosis not present

## 2015-11-01 DIAGNOSIS — R197 Diarrhea, unspecified: Secondary | ICD-10-CM | POA: Diagnosis not present

## 2015-11-01 DIAGNOSIS — K529 Noninfective gastroenteritis and colitis, unspecified: Secondary | ICD-10-CM | POA: Diagnosis not present

## 2015-11-04 DIAGNOSIS — M6281 Muscle weakness (generalized): Secondary | ICD-10-CM | POA: Diagnosis not present

## 2015-11-06 DIAGNOSIS — M6281 Muscle weakness (generalized): Secondary | ICD-10-CM | POA: Diagnosis not present

## 2015-11-27 DIAGNOSIS — A09 Infectious gastroenteritis and colitis, unspecified: Secondary | ICD-10-CM | POA: Diagnosis not present

## 2015-11-28 DIAGNOSIS — I10 Essential (primary) hypertension: Secondary | ICD-10-CM | POA: Diagnosis not present

## 2015-11-28 DIAGNOSIS — E119 Type 2 diabetes mellitus without complications: Secondary | ICD-10-CM | POA: Diagnosis not present

## 2015-12-05 DIAGNOSIS — I10 Essential (primary) hypertension: Secondary | ICD-10-CM | POA: Diagnosis not present

## 2015-12-05 DIAGNOSIS — E119 Type 2 diabetes mellitus without complications: Secondary | ICD-10-CM | POA: Diagnosis not present

## 2015-12-05 DIAGNOSIS — E7800 Pure hypercholesterolemia, unspecified: Secondary | ICD-10-CM | POA: Diagnosis not present

## 2016-02-05 DIAGNOSIS — L7211 Pilar cyst: Secondary | ICD-10-CM | POA: Diagnosis not present

## 2016-02-13 DIAGNOSIS — A09 Infectious gastroenteritis and colitis, unspecified: Secondary | ICD-10-CM | POA: Diagnosis not present

## 2016-02-27 DIAGNOSIS — E119 Type 2 diabetes mellitus without complications: Secondary | ICD-10-CM | POA: Diagnosis not present

## 2016-02-27 DIAGNOSIS — I10 Essential (primary) hypertension: Secondary | ICD-10-CM | POA: Diagnosis not present

## 2016-03-06 DIAGNOSIS — E7800 Pure hypercholesterolemia, unspecified: Secondary | ICD-10-CM | POA: Diagnosis not present

## 2016-03-06 DIAGNOSIS — E119 Type 2 diabetes mellitus without complications: Secondary | ICD-10-CM | POA: Diagnosis not present

## 2016-03-06 DIAGNOSIS — I10 Essential (primary) hypertension: Secondary | ICD-10-CM | POA: Diagnosis not present

## 2016-05-05 ENCOUNTER — Other Ambulatory Visit: Payer: Self-pay | Admitting: Oncology

## 2016-05-05 DIAGNOSIS — Z1231 Encounter for screening mammogram for malignant neoplasm of breast: Secondary | ICD-10-CM

## 2016-05-15 ENCOUNTER — Ambulatory Visit
Admission: RE | Admit: 2016-05-15 | Discharge: 2016-05-15 | Disposition: A | Discharge status: Home / Self Care | Payer: Medicare Other | Source: Ambulatory Visit | Attending: Oncology | Admitting: Oncology

## 2016-05-15 DIAGNOSIS — Z1231 Encounter for screening mammogram for malignant neoplasm of breast: Secondary | ICD-10-CM | POA: Diagnosis not present

## 2016-06-15 DIAGNOSIS — M436 Torticollis: Secondary | ICD-10-CM | POA: Diagnosis not present

## 2016-06-15 DIAGNOSIS — M47812 Spondylosis without myelopathy or radiculopathy, cervical region: Secondary | ICD-10-CM | POA: Diagnosis not present

## 2016-06-24 ENCOUNTER — Other Ambulatory Visit: Payer: Self-pay | Admitting: *Deleted

## 2016-06-24 DIAGNOSIS — C50911 Malignant neoplasm of unspecified site of right female breast: Secondary | ICD-10-CM

## 2016-06-25 ENCOUNTER — Other Ambulatory Visit (HOSPITAL_BASED_OUTPATIENT_CLINIC_OR_DEPARTMENT_OTHER): Payer: Medicare Other

## 2016-06-25 DIAGNOSIS — C50911 Malignant neoplasm of unspecified site of right female breast: Secondary | ICD-10-CM

## 2016-06-25 DIAGNOSIS — Z853 Personal history of malignant neoplasm of breast: Secondary | ICD-10-CM

## 2016-06-25 LAB — COMPREHENSIVE METABOLIC PANEL
ALT: <9 U/L (ref 0–55)
AST: 18 U/L (ref 5–34)
Albumin: 3.6 g/dL (ref 3.5–5.0)
Alkaline Phosphatase: 52 U/L (ref 40–150)
Anion Gap: 8 mEq/L (ref 3–11)
BUN: 25.6 mg/dL (ref 7.0–26.0)
CO2: 26 mEq/L (ref 22–29)
Calcium: 8.9 mg/dL (ref 8.4–10.4)
Chloride: 107 mEq/L (ref 98–109)
Creatinine: 1.2 mg/dL — ABNORMAL HIGH (ref 0.6–1.1)
EGFR: 44 mL/min/{1.73_m2} — ABNORMAL LOW (ref >90)
Glucose: 172 mg/dl — ABNORMAL HIGH (ref 70–140)
Potassium: 4.7 mEq/L (ref 3.5–5.1)
Sodium: 141 mEq/L (ref 136–145)
Total Bilirubin: 0.41 mg/dL (ref 0.20–1.20)
Total Protein: 6.8 g/dL (ref 6.4–8.3)

## 2016-06-25 LAB — CBC WITH DIFFERENTIAL/PLATELET
BASO%: 0.2 % (ref 0.0–2.0)
Basophils Absolute: 0 10*3/uL (ref 0.0–0.1)
EOS%: 3.3 % (ref 0.0–7.0)
Eosinophils Absolute: 0.2 10*3/uL (ref 0.0–0.5)
HCT: 34.6 % — ABNORMAL LOW (ref 34.8–46.6)
HGB: 11.2 g/dL — ABNORMAL LOW (ref 11.6–15.9)
LYMPH%: 21.2 % (ref 14.0–49.7)
MCH: 30.6 pg (ref 25.1–34.0)
MCHC: 32.4 g/dL (ref 31.5–36.0)
MCV: 94.5 fL (ref 79.5–101.0)
MONO#: 0.4 10*3/uL (ref 0.1–0.9)
MONO%: 6.8 % (ref 0.0–14.0)
NEUT#: 3.5 10*3/uL (ref 1.5–6.5)
NEUT%: 68.5 % (ref 38.4–76.8)
Platelets: 235 10*3/uL (ref 145–400)
RBC: 3.66 10*6/uL — ABNORMAL LOW (ref 3.70–5.45)
RDW: 14.3 % (ref 11.2–14.5)
WBC: 5.1 10*3/uL (ref 3.9–10.3)
lymph#: 1.1 10*3/uL (ref 0.9–3.3)

## 2016-07-02 ENCOUNTER — Ambulatory Visit (HOSPITAL_BASED_OUTPATIENT_CLINIC_OR_DEPARTMENT_OTHER): Payer: Medicare Other | Admitting: Oncology

## 2016-07-02 VITALS — BP 91/53 | HR 99 | Temp 98.4°F | Resp 18 | Ht 62.0 in | Wt 184.8 lb

## 2016-07-02 DIAGNOSIS — C50911 Malignant neoplasm of unspecified site of right female breast: Secondary | ICD-10-CM | POA: Diagnosis not present

## 2016-07-02 DIAGNOSIS — Z170 Estrogen receptor positive status [ER+]: Secondary | ICD-10-CM

## 2016-07-02 DIAGNOSIS — N183 Chronic kidney disease, stage 3 (moderate): Secondary | ICD-10-CM | POA: Diagnosis not present

## 2016-07-02 NOTE — Progress Notes (Signed)
ID: Mallory Thomas   DOB: 29-Jan-1932  MR#: [id number]  GXE#:[id number]  Patient Care Team: Mallory Grippe, MD as PCP - General (Internal Medicine) Mallory Dell, MD (Hematology and Oncology) Mallory Dell, MD as Consulting Physician (Oncology) Mallory Rigger, MD as Consulting Physician (Gastroenterology) Mallory Decamp, MD as Consulting Physician (Cardiology)   CHIEF COMPLAINT: Right-sided breast cancer  CURRENT TREATMENT: Observation  HISTORY OF PRESENT ILLNESS: From the original intake note:  Screening mammogram at [facility] showed a suspicious lesion in her right breast.  She was then referred to the [facility] where ultrasound was performed and core needle biopsy was obtained.  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 Maple Thomas for further evaluation.  On 04/30/03, she underwent right lumpectomy with axillary lymph node biopsy and the final pathology (Mallory Thomas) showed an infiltrating ductal carcinoma 0.9 cm on this study, with one sentinel lymph node negative for spread of disease.  Grade was 2.  There was no evidence of vascular or lymphatic invasion.  There was no extensive intraductal component.  Note that the patient's core biopsy which measured 1.2 cm, although that is not stated in the original pathology report, is being used as the "T" portion of the patient's staging record.  Her subsequent history is as detailed below.  INTERVAL HISTORY: Mallory Thomas returns today for followup of her remote breast cancer. She was most recently on the B 42 study, either a placebo or letrozole. That has not yet been blinded.  Her health maintenance was recently updated. She had a colonoscopy because of persistent stool incontinence problems. That was benign and her diarrheal issue is well-controlled on colestipol  REVIEW OF SYSTEMS: She is generally doing quite well. She goes to water aerobics for 5  times a week. She is very active at [employer]. She is a Therapist, occupational. She tells me her family is doing "great". She gives me no symptoms related to her earlier treatments. A detailed review of systems today was otherwise noncontributory  CARDIAC RISK FACTORS: The patient has a history of well-controlled diabetes and hypertension, controlled hypertriglyceridemia and a positive family history for heart disease. Recent evaluation showed no evidence of peripheral vascular disease. She follows Dr. Jacinto Halim regarding all this  PAST MEDICAL HISTORY: Past Medical History:  Diagnosis Date  . Breast cancer Apr 23, 2003   right  . Diabetes mellitus   . Heart murmur   . Hyperlipidemia   . Hypertension   history cat scratch disease Squamous cell skin cancers (Dr Leta Speller)  PAST SURGICAL HISTORY: Past Surgical History:  Procedure Laterality Date  . BILATERAL KNEE ARTHROSCOPY    . BREAST LUMPECTOMY  2003/04/23   right  . lymph node removal right arm      FAMILY HISTORY The patient's family were [employer].  Her father died at the age of 27 from a heart attack.  Her mother died at age 80, and she had a breast cancer diagnosed at age 7.  She had a mastectomy and Tamoxifen at that time.   GYNECOLOGIC HISTORY: She is G3, F3, P0, A0, L3.  Menopause age 50.  She never took hormones.  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  Her husband died in the year 1999-04-23.  He had lung cancer and was taken care of by Dr. Arline Asp.  Her children are Heidi (who lives in [place] and sells medications to labs);  Shanon Brow (who lives in [place] and is in [employer]); and Medical sales representative (who lives in [place] and is currently a homemaker.).  The patient has three grandsons: Barnabas Lister, 29; Case, 14; and Nicholas; 8.  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  She lives alone with her cat Barth Kirks   ADVANCED DIRECTIVES: In place  HEALTH  MAINTENANCE: Social History  Substance Use Topics  . Smoking status: Former Smoker    Quit date: 06/04/1968  . Smokeless tobacco: Not on file  . Alcohol use Not on file     Comment: occasional     Colonoscopy: 11/01/2015/i Magodin the assessment and however you and seeing Berta Minor today and she was in 51 and have a unblinded that she keeps asking me when she took 5 cancer can will I am seeing her molars almost it's that extra little bit of that field with  PAP:  Bone density: 2014  Lipid panel:  Allergies  Allergen Reactions  . Shellfish Allergy Hives  . Tape     Current Outpatient Prescriptions  Medication Sig Dispense Refill  . aspirin 81 MG tablet Take 81 mg by mouth daily.      Marland Kitchen atorvastatin (LIPITOR) 80 MG tablet Take 1 tablet (80 mg total) by mouth daily.    . Investigational letrozole/placebo 2.'5MG'$  tablet NSABP B-42 Take 2.5 mg by mouth daily.      . irbesartan (AVAPRO) 300 MG tablet Take 300 mg by mouth at bedtime.    . saxagliptin HCl (ONGLYZA) 5 MG TABS tablet Take 1 tablet (5 mg total) by mouth daily. 30 tablet    No current facility-administered medications for this visit.     OBJECTIVE: Older white woman In no acute distress Vitals:   07/02/16 1300  BP: (!) 91/53  Pulse: 99  Resp: 18  Temp: 98.4 F (36.9 C)     Body mass index is 33.8 kg/m.    ECOG FS: 0  Sclerae unicteric, pupils round and equal Oropharynx clear and moist-- no thrush or other lesions No cervical or supraclavicular adenopathy Lungs no rales or rhonchi Heart regular rate and rhythm Abd soft, nontender, positive bowel sounds MSK no focal spinal tenderness, no upper extremity lymphedema Neuro: nonfocal, well oriented, appropriate affect Breasts: The right breast is status post lumpectomy. There is no evidence of local recurrence. The right axilla is benign. Left breast is unremarkable.   LAB RESULTS: Lab Results  Component Value Date   WBC 5.1 06/25/2016   NEUTROABS 3.5  06/25/2016   HGB 11.2 (L) 06/25/2016   HCT 34.6 (L) 06/25/2016   MCV 94.5 06/25/2016   PLT 235 06/25/2016      Chemistry      Component Value Date/Time   NA 141 06/25/2016 1239   K 4.7 06/25/2016 1239   CL 105 06/25/2014 1106   CO2 26 06/25/2016 1239   BUN 25.6 06/25/2016 1239   CREATININE 1.2 (H) 06/25/2016 1239      Component Value Date/Time   CALCIUM 8.9 06/25/2016 1239   ALKPHOS 52 06/25/2016 1239   AST 18 06/25/2016 1239   ALT <9 06/25/2016 1239   BILITOT 0.41 06/25/2016 1239       Lab Results  Component Value Date   LABCA2 13 05/17/2008    No components found for: [id number]  No results for input(s): INR in the last 168 hours.  Urinalysis No results found for: COLORURINE  STUDIES: CLINICAL DATA:  Screening. History of treated  right breast cancer, status post lumpectomy and radiation therapy.  EXAM: DIGITAL SCREENING BILATERAL MAMMOGRAM WITH CAD  COMPARISON:  Previous exam(s).  ACR Breast Density Category b: There are scattered areas of fibroglandular density.  FINDINGS: There are no findings suspicious for malignancy. Images were processed with CAD.  IMPRESSION: No mammographic evidence of malignancy. A result letter of this screening mammogram will be mailed directly to the patient.  RECOMMENDATION: Screening mammogram in one year. (Code:[id number]-01Y)  BI-RADS CATEGORY  1: Negative.   Electronically Signed   By: Fidela Salisbury M.D.   On: 05/18/2016 13:59   ASSESSMENT: 82 y.o. Mallory Thomas woman status post right lumpectomy and sentinel lymph node biopsy June 2004 for a 1.2 cm estrogen and progesterone receptor positive, HER2/neu negative, grade 2 invasive ductal carcinoma, lymph node negative,   (1) on Arimidex between July 2004 and July 2009 through the MA27 study, note that the study still is unblinded so we do not know if she took 5 or 10 years of aromatase inhibitors  (2) enrolled in the B42 study as of July 2009 and is  either receiving placebo or letrozole.   (3) normal bone density 09/13/2013 at [facility].  (4) CKD stage III  PLAN:   Mallory Thomas is not superstitious so she is pleased that she is 13 years out from her diagnosis of breast cancer with no evidence of disease recurrence. This is very favorable.  Her health maintenance is up-to-date, with her recent mammogram and colonoscopy. She is going to see Dr. Einar Gip next week as well for routine follow-up.  I suggested she move the goal of her A1c down to 6.5 now that she has achieved less than 7.0. Hopefully this will help prheridneys  She will see me again for continuing follow-up through the B 42 study in one year. She knows to call for any problems that may develop before that visit. Mahesh Sizemore C    07/02/2016

## 2016-07-08 DIAGNOSIS — I350 Nonrheumatic aortic (valve) stenosis: Secondary | ICD-10-CM | POA: Diagnosis not present

## 2016-07-09 DIAGNOSIS — I739 Peripheral vascular disease, unspecified: Secondary | ICD-10-CM | POA: Diagnosis not present

## 2016-07-09 DIAGNOSIS — I350 Nonrheumatic aortic (valve) stenosis: Secondary | ICD-10-CM | POA: Diagnosis not present

## 2016-07-09 DIAGNOSIS — I10 Essential (primary) hypertension: Secondary | ICD-10-CM | POA: Diagnosis not present

## 2016-07-23 DIAGNOSIS — I739 Peripheral vascular disease, unspecified: Secondary | ICD-10-CM | POA: Diagnosis not present

## 2016-08-06 DIAGNOSIS — I350 Nonrheumatic aortic (valve) stenosis: Secondary | ICD-10-CM | POA: Diagnosis not present

## 2016-08-06 DIAGNOSIS — E7800 Pure hypercholesterolemia, unspecified: Secondary | ICD-10-CM | POA: Diagnosis not present

## 2016-08-06 DIAGNOSIS — E1159 Type 2 diabetes mellitus with other circulatory complications: Secondary | ICD-10-CM | POA: Diagnosis not present

## 2016-08-06 DIAGNOSIS — I739 Peripheral vascular disease, unspecified: Secondary | ICD-10-CM | POA: Diagnosis not present

## 2016-08-24 DIAGNOSIS — E119 Type 2 diabetes mellitus without complications: Secondary | ICD-10-CM | POA: Diagnosis not present

## 2016-08-24 DIAGNOSIS — I350 Nonrheumatic aortic (valve) stenosis: Secondary | ICD-10-CM | POA: Diagnosis not present

## 2016-08-24 DIAGNOSIS — I739 Peripheral vascular disease, unspecified: Secondary | ICD-10-CM | POA: Diagnosis not present

## 2016-09-23 DIAGNOSIS — E119 Type 2 diabetes mellitus without complications: Secondary | ICD-10-CM | POA: Diagnosis not present

## 2016-09-23 DIAGNOSIS — I10 Essential (primary) hypertension: Secondary | ICD-10-CM | POA: Diagnosis not present

## 2016-10-15 DIAGNOSIS — E119 Type 2 diabetes mellitus without complications: Secondary | ICD-10-CM | POA: Diagnosis not present

## 2016-10-15 DIAGNOSIS — Z961 Presence of intraocular lens: Secondary | ICD-10-CM | POA: Diagnosis not present

## 2016-10-22 DIAGNOSIS — E119 Type 2 diabetes mellitus without complications: Secondary | ICD-10-CM | POA: Diagnosis not present

## 2016-10-22 DIAGNOSIS — I10 Essential (primary) hypertension: Secondary | ICD-10-CM | POA: Diagnosis not present

## 2016-10-22 DIAGNOSIS — E7800 Pure hypercholesterolemia, unspecified: Secondary | ICD-10-CM | POA: Diagnosis not present

## 2016-10-29 DIAGNOSIS — E7800 Pure hypercholesterolemia, unspecified: Secondary | ICD-10-CM | POA: Diagnosis not present

## 2016-10-30 DIAGNOSIS — I10 Essential (primary) hypertension: Secondary | ICD-10-CM | POA: Diagnosis not present

## 2016-10-30 DIAGNOSIS — E7800 Pure hypercholesterolemia, unspecified: Secondary | ICD-10-CM | POA: Diagnosis not present

## 2016-11-05 DIAGNOSIS — I739 Peripheral vascular disease, unspecified: Secondary | ICD-10-CM | POA: Diagnosis not present

## 2016-11-05 DIAGNOSIS — E7800 Pure hypercholesterolemia, unspecified: Secondary | ICD-10-CM | POA: Diagnosis not present

## 2016-11-05 DIAGNOSIS — I350 Nonrheumatic aortic (valve) stenosis: Secondary | ICD-10-CM | POA: Diagnosis not present

## 2016-11-05 DIAGNOSIS — E1159 Type 2 diabetes mellitus with other circulatory complications: Secondary | ICD-10-CM | POA: Diagnosis not present

## 2016-11-08 DIAGNOSIS — I739 Peripheral vascular disease, unspecified: Secondary | ICD-10-CM | POA: Diagnosis present

## 2016-11-08 NOTE — H&P (Signed)
OFFICE VISIT NOTES COPIED TO EPIC FOR DOCUMENTATION  History of Present Illness Mallory Page MD; 11/05/2016 6:23 PM) Patient words: 3 month FU for aortic stenosis, pad, stress results and labs; Last OV.  The patient is a 82 year old female who presents for a follow-up for Aortic stenosis. Patient presents here for 3 month visit of severe aortic stenosis by echocardiogram in 2017. She has history of right breast cancer status radiation therapy and lumpectomy in 2000 for, diabetes mellitus, hypertension and hyperlipidemia.   Her main complaint today is marked fatigue and discomfort in both legs especially bilateral hips with routine activities. She states that she has limited activities due to marked bilateral hip claudication, left slightly worse. She denies any shortness of breath but states that she has not been able to do much because of leg pain. No chest pain, palpitation, dizziness or syncope. She states that her diabetes is well controlled.  In spite of this, she states that she does well with water aerobics without any limitations. She has no discomfort in the water. She denies any dyspnea due to water aerobics but has not been exercising beyond this. She continues to do all her household chores but lately it is becoming difficulty due to pain in hips with activity.  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 Moderate concentric hypertrophy of the left ventricle. Normal global wall motion. Doppler evidence of grade I (impaired) diastolic dysfunction. Calculated EF 73%. Moderate aortic valve leaflet calcification. Moderately restricted aortic valve leaflets. Peak gradient of  76 and mean gradient of 45 mmHg, aortic valve area 0.69 cm. Severe aortic valve stenosis. No aortic valve regurgitation noted. Mild tricuspid regurgitation. No evidence of pulmonary hypertension. Insignificant pericardial effusion. Compared to the study done on 07/11/2015, aortic stenosis severely the has worsened from moderate aortic stenosis. Stress test- 04/13/13: Negative for ischemia. walked for 5 minutes. peak HR- 144/min (101% MPHR), achieved 7.3 METS. Pt. had -0.5 to -12m upslopind ST depression in inf.lat. leads at peak exercise. Overall low risk. No claudication. Claudication, intermittent (I73.9)  Lower extremity arterial duplex 07/23/2016: No hemodynamically significant stenoses are identified in the lower extremity arterial system. Moderately decreased right resting ABI. Resting ankle brachial index of the left lower extremity is within normal limits noted at the post tibial artery level. RABI 0.70 and LABI 1.00. Study suggests diffuse small vessel disease below knee. No significant change from LE arterial duplex 03/09/13: LABI 0.94 and RABI 0.91. Bilateral carotid bruits (R09.89)  Carotid duplex 05/10/2013: No evidence of hemodynamically significant stenosis in the bilateral carotid bifurcation vessels. There is evidence of heterogeneous plaque in the bilateral carotid artery. Mild bilateral intimal thickening. BMI 32.0-32.9,adult (Z68.32)  Labwork  10/30/2016: Total cholesterol 251, triglycerides 82, HDL 76, LDL 159, glucose 142, creatinine 0.96, potassium 5.1, CMP otherwise normal 09/23/2016: Serum glucose 138, BUN 22, serum creatinine 0.88, CMP normal. Potassium 5.2. A1c 6.6%. CBC normal, total cholesterol 198, triglycerides 77, HDL 66, LDL 117. 02/27/2016: HB 2.3/HCT 39.4, platelets 156. Serum glucose 124, BUN 25, serum creatinine 0.96, eGFR 56 mL, CMP otherwise normal. A1c 6.7%. T. cholesterol 246, HDL 85, LDL 145, triglycerides 81. Colitis (K52.9)  Cancer of breast (C50.919) 2004 Rt  breast  Allergies (Bernell List 11/05/2016 12:58 PM) Shellfish  Hives.  Seafood  Hives. Adhesive Bandages *MEDICAL DEVICES AND SUPPLIES*  Rash.  Family History Bernell List; 12-05-16 12:51 PM) Mother  Deceased. at age 64 from an MI Father  Deceased. at age 60 from an MI Siblings  1 sibling died from lung cancer  Social History Bernell List; December 05, 2016 12:51 PM) Current tobacco use  Former smoker. quit smoking 82 years old Alcohol Use  Occasional alcohol use. Marital status  Widowed. Living Situation  lives alone Number of Children  3.  Past Surgical History Bernell List; 12/05/16 12:51 PM) Arm surgery 1986 Lumpectomy 2004 Right. Arthroscopic Knee Surgery - Both 2013  Medication History Bernell List; 05-Dec-2016 12:59 PM) Ezetimibe ('10MG'$  Tablet, 1 (one) Tablet Oral take in the evening after dinner, Taken starting 08/06/2016) Discontinued: Patient Choice. Irbesartan ('300MG'$  Tablet, 1 Oral daily) Active. Aspirin ('81MG'$  Tablet, 1 Oral at bedtime) Active. Atorvastatin Calcium ('80MG'$  Tablet, 1 Oral daily) Active. Onglyza ('5MG'$  Tablet, 1 Oral daily) Active. Colestipol HCl (1GM Tablet, 1 Oral daily) Active. Medications Reconciled (verbally with patient)  Diagnostic Studies History Bernell List; 2016-12-05 12:51 PM) Treadmill stress test 08/24/2016 Indications:Aortic Stenosis The resting electrocardiogram demonstrated normal sinus rhythm, no resting arrhythmias and normal rest repolarization. The stress electrocardiogram was normal. There were no significant arrhythmias. Patient exercised on Bruce protocol for 5:27 minutes and achieved 97% of Max Predicted HR (Target HR was >85% MPHR) and 7.05 METS. Stress symptoms included fatigue and dyspnea. Normal BP response. Exercise capacity was low . Impression: Normal stress EKG. No significant arrhythmias. Normal BP response. Echocardiogram 07/08/2016 Left ventricle cavity is normal in size. Moderate  concentric hypertrophy of the left ventricle. Normal global wall motion. Doppler evidence of grade I (impaired) diastolic dysfunction. Calculated EF 73%. Moderate aortic valve leaflet calcification. Moderately restricted aortic valve leaflets. Peak gradient of 76 and mean gradient of 45 mmHg, aortic valve area 0.69 cm. Severe aortic valve stenosis. No aortic valve regurgitation noted. Mild tricuspid regurgitation. No evidence of pulmonary hypertension. Insignificant pericardial effusion. Compared to the study done on 07/11/2015, aortic stenosis severely the has worsened from moderate aortic stenosis. Lower Extremity Dopplers 07/23/2016 No hemodynamically significant stenoses are identified in the lower extremity arterial system. Moderately decreased right resting ABI. Resting ankle brachial index of the left lower extremity is within normal limits noted at the post tibial artery level. RABI 0.70 and LABI 1.00. Study suggests diffuse small vessel disease below knee. Colonoscopy 02/2016 Normal.    Review of Systems Mallory Page, MD; 2016-12-05 6:23 PM) General Present- Feeling well. Not Present- Fatigue, Fever and Night Sweats. Skin Not Present- Itching and Rash. HEENT Not Present- Headache. Respiratory Not Present- Difficulty Breathing, Wakes up from Sleep Wheezing or Short of Breath and Wheezing. Cardiovascular Present- Leg Cramps. Not Present- Claudications, Fainting and Orthopnea. Gastrointestinal Not Present- Black, Tarry Stool, Constipation, Dysphagia, Nausea and Vomiting. Musculoskeletal Present- Joint Pain. Not Present- Claudication and Joint Swelling. Neurological Not Present- Headaches. Psychiatric Not Present- Anxiety and Insomnia. Endocrine Not Present- Excessive Sweating, Polydipsia and Polyuria. Hematology Not Present- Blood Clots, Easy Bruising and Nose Bleed.  Vitals Bernell List; 2016-12-05 1:04 PM) 12-05-2016 12:51 PM Weight: 182.56 lb Height: 62.5in Body Surface  Area: 1.85 m Body Mass Index: 32.86 kg/m  Pulse: 101 (Regular)  P.OX: 99% (Room air) BP: 106/60 (Sitting, Left Arm, Standard)  Physical Exam Mallory Page MD; December 05, 2016 6:21 PM) General Mental Status-Alert. General Appearance-Cooperative, Appears stated age, Not in acute distress. Orientation-Oriented X3. Build & Nutrition-Well built and Mildly obese.  Head and Neck Thyroid Gland Characteristics -  no palpable nodules, no palpable enlargement.  Chest and Lung Exam Chest and lung exam reveals -quiet, even and easy respiratory effort with no use of accessory muscles and on auscultation, normal breath sounds, no adventitious sounds.  Cardiovascular Inspection Jugular vein - Right - No Distention. Auscultation Rhythm - Regular. Heart Sounds - S1 WNL, S2 Diminished intensity and No gallop present. Murmurs & Other Heart Sounds: Murmur - Location - Aortic Area. Timing - Early systolic. Grade - III/VI. Radiation - Carotids.  Abdomen Palpation/Percussion Normal exam - Non Tender and No hepatosplenomegaly. Auscultation Normal exam - Bowel sounds normal.  Peripheral Vascular Lower Extremity Inspection - Left - No Pigmentation, No Varicose veins. Right - No Pigmentation, No Varicose veins. Palpation - Edema - Bilateral - No edema. Femoral pulse - Bilateral - Feeble(Bilateral soft bruit present. Pulse difficult to feel due to patient's bodily habitus.). Popliteal pulse - Bilateral - 1+. Dorsalis pedis pulse - Bilateral - 1+. Posterior tibial pulse - Bilateral - 1+. Carotid arteries - Bilateral-Harsh Bruit. Abdomen-No prominent abdominal aortic pulsation, No epigastric bruit.  Neurologic Motor-Grossly intact without any focal deficits.  Musculoskeletal - Did not examine.   Assessment & Plan Mallory Page MD; 11/05/2016 6:20 PM) Severe aortic stenosis (I35.0) Story: Echocardiogram 07/08/2016: Left ventricle cavity is normal in size. Moderate  concentric hypertrophy of the left ventricle. Normal global wall motion. Doppler evidence of grade I (impaired) diastolic dysfunction. Calculated EF 73%. Moderate aortic valve leaflet calcification. Moderately restricted aortic valve leaflets. Peak gradient of 76 and mean gradient of 45 mmHg, aortic valve area 0.69 cm. Severe aortic valve stenosis. No aortic valve regurgitation noted. Mild tricuspid regurgitation. No evidence of pulmonary hypertension. Insignificant pericardial effusion. Compared to the study done on 07/11/2015, aortic stenosis severely the has worsened from moderate aortic stenosis. Impression: Treadmill exercise stress test 08/24/2016: Indications: Aortic Stenosis The resting electrocardiogram demonstrated normal sinus rhythm, no resting arrhythmias and normal rest repolarization. The stress electrocardiogram was normal. There were no significant arrhythmias. Patient exercised on Bruce protocol for 5:27 minutes and achieved 97% of Max Predicted HR (Target HR was >85% MPHR) and 7.05 METS. Stress symptoms included fatigue and dyspnea. Normal BP response. Exercise capacity was low . Impression: Normal stress EKG. No significant arrhythmias. Normal BP response. Claudication, intermittent (I73.9) Story: Lower extremity arterial duplex 07/23/2016: No hemodynamically significant stenoses are identified in the lower extremity arterial system. Moderately decreased right resting ABI. Resting ankle brachial index of the left lower extremity is within normal limits noted at the post tibial artery level. RABI 0.70 and LABI 1.00. Study suggests diffuse small vessel disease below knee. No significant change from LE arterial duplex 03/09/13: LABI 0.94 and RABI 0.91. 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 Hemoglobin 12.6/hematocrit 37.3, platelets 177, normal indicis. Pro time normal.  10/30/2016: Total cholesterol 251, triglycerides 82, HDL 76, LDL 159, glucose 142, creatinine 0.96, potassium 5.1, CMP otherwise normal  09/23/2016: Serum glucose 138, BUN 22, serum creatinine 0.88, CMP normal. Potassium 5.2. A1c 6.6%. CBC normal, total cholesterol 198, triglycerides 77, HDL 66, LDL 117.  02/27/2016: HB 2.3/HCT 39.4, platelets 156. Serum glucose 124, BUN 25, serum creatinine 0.96, eGFR 56 mL, CMP otherwise normal. A1c 6.7%. T. cholesterol 246, HDL 85, LDL 145, triglycerides 81.  Current Plans Mechanism of underlying disease process and  action of medications discussed with the patient. I discussed primary/secondary prevention and also dietary counceling was done. Patient presenting with severe symptoms suggestive of bilateral hip claudication, however her vascular studies suggest only small vessel disease and there is triphasic waveforms noted at the common femoral artery level.  She is accompanied by her daughter today and her main complaint continues to be severe cramping in the lateral hips with activity which is been severely debilitating. She does have bilateral femoral artery bruit and femoral pulses are difficult to feel due to obesity. I discussed where he is test that could be performed including CT angiogram, abdominal aortic duplex, however there would like to proceed with peripheral arteriogram for direct with sterilization of the arteries and possible angioplasty.  With regard to aortic stenosis, in spite of severe stenosis she was able to complete 9 minutes of treadmill stress test on the Bruce protocol. Although she did not have symptoms of claudication, during the stress test, clearly her symptoms are severe enough to warrant further evaluation.  In spite of maximum dose atorvastatin, lipids are still uncontrolled. She could not  tolerate Zetia due to diarrhea. Praluent or Repatha could be a choice however unless vascular disease is confirmed, given high HDL and being on Nexium dose Lipitor and age greater than 50 years, the could certainly consider oral form therapy only. I'll make recommendations following peripheral arteriogram. Needs to schedule for peripheral arteriogram and possible angioplasty given symptoms. Patient understands the risks, benefits, alternatives including medical therapy, CT angiography. Patient understands <1-2% risk of death, embolic complications, bleeding, infection, renal failure, urgent surgical revascularization, but not limited to these and wants to proceed.  CC Dr. Jani Gravel.    Signed by Mallory Page, MD (11/05/2016 6:24 PM)

## 2016-11-10 ENCOUNTER — Encounter (HOSPITAL_COMMUNITY): Payer: Self-pay | Admitting: Cardiology

## 2016-11-10 ENCOUNTER — Ambulatory Visit (HOSPITAL_COMMUNITY)
Admission: RE | Admit: 2016-11-10 | Discharge: 2016-11-10 | Disposition: A | Discharge status: Home / Self Care | Payer: Medicare Other | Source: Ambulatory Visit | Attending: Cardiology | Admitting: Cardiology

## 2016-11-10 ENCOUNTER — Encounter (HOSPITAL_COMMUNITY)
Admission: RE | Disposition: A | Discharge status: Home / Self Care | Payer: Self-pay | Source: Ambulatory Visit | Attending: Cardiology

## 2016-11-10 DIAGNOSIS — I739 Peripheral vascular disease, unspecified: Secondary | ICD-10-CM | POA: Diagnosis not present

## 2016-11-10 DIAGNOSIS — Z853 Personal history of malignant neoplasm of breast: Secondary | ICD-10-CM | POA: Diagnosis not present

## 2016-11-10 DIAGNOSIS — Z923 Personal history of irradiation: Secondary | ICD-10-CM | POA: Diagnosis not present

## 2016-11-10 DIAGNOSIS — I350 Nonrheumatic aortic (valve) stenosis: Secondary | ICD-10-CM | POA: Diagnosis not present

## 2016-11-10 DIAGNOSIS — E7800 Pure hypercholesterolemia, unspecified: Secondary | ICD-10-CM | POA: Diagnosis not present

## 2016-11-10 DIAGNOSIS — Z801 Family history of malignant neoplasm of trachea, bronchus and lung: Secondary | ICD-10-CM | POA: Diagnosis not present

## 2016-11-10 DIAGNOSIS — Z7982 Long term (current) use of aspirin: Secondary | ICD-10-CM | POA: Diagnosis not present

## 2016-11-10 DIAGNOSIS — E1151 Type 2 diabetes mellitus with diabetic peripheral angiopathy without gangrene: Secondary | ICD-10-CM | POA: Insufficient documentation

## 2016-11-10 DIAGNOSIS — I70211 Atherosclerosis of native arteries of extremities with intermittent claudication, right leg: Secondary | ICD-10-CM | POA: Insufficient documentation

## 2016-11-10 DIAGNOSIS — E785 Hyperlipidemia, unspecified: Secondary | ICD-10-CM | POA: Insufficient documentation

## 2016-11-10 DIAGNOSIS — I701 Atherosclerosis of renal artery: Secondary | ICD-10-CM | POA: Insufficient documentation

## 2016-11-10 DIAGNOSIS — Z8249 Family history of ischemic heart disease and other diseases of the circulatory system: Secondary | ICD-10-CM | POA: Insufficient documentation

## 2016-11-10 DIAGNOSIS — Z91013 Allergy to seafood: Secondary | ICD-10-CM | POA: Insufficient documentation

## 2016-11-10 DIAGNOSIS — I700 Atherosclerosis of aorta: Secondary | ICD-10-CM | POA: Insufficient documentation

## 2016-11-10 DIAGNOSIS — Z6832 Body mass index (BMI) 32.0-32.9, adult: Secondary | ICD-10-CM | POA: Insufficient documentation

## 2016-11-10 DIAGNOSIS — I10 Essential (primary) hypertension: Secondary | ICD-10-CM | POA: Insufficient documentation

## 2016-11-10 DIAGNOSIS — E669 Obesity, unspecified: Secondary | ICD-10-CM | POA: Diagnosis not present

## 2016-11-10 DIAGNOSIS — Z87891 Personal history of nicotine dependence: Secondary | ICD-10-CM | POA: Insufficient documentation

## 2016-11-10 HISTORY — PX: PERIPHERAL VASCULAR CATHETERIZATION: SHX172C

## 2016-11-10 LAB — GLUCOSE, CAPILLARY
Glucose-Capillary: 158 mg/dL — ABNORMAL HIGH (ref 65–99)
Glucose-Capillary: 166 mg/dL — ABNORMAL HIGH (ref 65–99)
Glucose-Capillary: 151 mg/dL — ABNORMAL HIGH (ref 65–99)

## 2016-11-10 LAB — POCT ACTIVATED CLOTTING TIME: Activated Clotting Time: 153 seconds

## 2016-11-10 SURGERY — LOWER EXTREMITY ANGIOGRAPHY
Anesthesia: LOCAL | Laterality: Right

## 2016-11-10 MED ORDER — DOCUSATE SODIUM 100 MG PO CAPS: 100.0000 mg | ORAL_CAPSULE | Freq: Every day | ORAL | Status: DC

## 2016-11-10 MED ORDER — LABETALOL HCL 5 MG/ML IV SOLN
INTRAVENOUS | Status: AC
  Filled 2016-11-10: qty 4

## 2016-11-10 MED ORDER — MAGNESIUM SULFATE 2 GM/50ML IV SOLN: 2.0000 g | Freq: Every day | INTRAVENOUS | Status: DC | PRN

## 2016-11-10 MED ORDER — HEPARIN SODIUM (PORCINE) 1000 UNIT/ML IJ SOLN
INTRAMUSCULAR | Status: AC
  Filled 2016-11-10: qty 1

## 2016-11-10 MED ORDER — CLOPIDOGREL BISULFATE 300 MG PO TABS
ORAL_TABLET | ORAL | Status: AC
  Filled 2016-11-10: qty 2

## 2016-11-10 MED ORDER — POTASSIUM CHLORIDE CRYS ER 20 MEQ PO TBCR: 20.0000 meq | EXTENDED_RELEASE_TABLET | Freq: Every day | ORAL | Status: DC | PRN

## 2016-11-10 MED ORDER — FENTANYL CITRATE (PF) 100 MCG/2ML IJ SOLN
INTRAMUSCULAR | Status: DC | PRN
Start: 2016-11-10 — End: 2016-11-10
  Administered 2016-11-10 (×3): 25 ug via INTRAVENOUS

## 2016-11-10 MED ORDER — LABETALOL HCL 5 MG/ML IV SOLN
INTRAVENOUS | Status: DC | PRN
  Administered 2016-11-10: 10 mg via INTRAVENOUS

## 2016-11-10 MED ORDER — SODIUM CHLORIDE 0.9 % IV SOLN
INTRAVENOUS | Status: DC
  Administered 2016-11-10: 08:00:00 via INTRAVENOUS

## 2016-11-10 MED ORDER — LIDOCAINE HCL (PF) 1 % IJ SOLN
INTRAMUSCULAR | Status: DC | PRN
  Administered 2016-11-10: 22 mL

## 2016-11-10 MED ORDER — ALUM & MAG HYDROXIDE-SIMETH 200-200-20 MG/5ML PO SUSP: 15.0000 mL | ORAL | Status: DC | PRN

## 2016-11-10 MED ORDER — PANTOPRAZOLE SODIUM 40 MG PO TBEC: 40.0000 mg | DELAYED_RELEASE_TABLET | Freq: Every day | ORAL | Status: DC

## 2016-11-10 MED ORDER — CEFUROXIME SODIUM 1.5 G IJ SOLR: 1.5000 g | Freq: Two times a day (BID) | INTRAMUSCULAR | Status: DC

## 2016-11-10 MED ORDER — GUAIFENESIN-DM 100-10 MG/5ML PO SYRP: 15.0000 mL | ORAL_SOLUTION | ORAL | Status: DC | PRN

## 2016-11-10 MED ORDER — LIDOCAINE HCL (PF) 1 % IJ SOLN
INTRAMUSCULAR | Status: AC
  Filled 2016-11-10: qty 30

## 2016-11-10 MED ORDER — CLOPIDOGREL BISULFATE 75 MG PO TABS: 75.0000 mg | ORAL_TABLET | Freq: Every day | ORAL | 0 refills | Status: DC

## 2016-11-10 MED ORDER — HEPARIN (PORCINE) IN NACL 2-0.9 UNIT/ML-% IJ SOLN
INTRAMUSCULAR | Status: DC | PRN
  Administered 2016-11-10: 1000 mL

## 2016-11-10 MED ORDER — SODIUM CHLORIDE 0.9 % IV SOLN: 1.0000 mL/kg/h | INTRAVENOUS | Status: DC

## 2016-11-10 MED ORDER — ACETAMINOPHEN 325 MG PO TABS: 325.0000 mg | ORAL_TABLET | ORAL | Status: DC | PRN

## 2016-11-10 MED ORDER — CLOPIDOGREL BISULFATE 300 MG PO TABS
ORAL_TABLET | ORAL | Status: DC | PRN
  Administered 2016-11-10: 600 mg via ORAL

## 2016-11-10 MED ORDER — METOPROLOL TARTRATE 5 MG/5ML IV SOLN: 2.0000 mg | INTRAVENOUS | Status: DC | PRN

## 2016-11-10 MED ORDER — MIDAZOLAM HCL 2 MG/2ML IJ SOLN
INTRAMUSCULAR | Status: AC
  Filled 2016-11-10: qty 2

## 2016-11-10 MED ORDER — MIDAZOLAM HCL 2 MG/2ML IJ SOLN
INTRAMUSCULAR | Status: DC | PRN
  Administered 2016-11-10 (×2): 1 mg via INTRAVENOUS

## 2016-11-10 MED ORDER — HYDRALAZINE HCL 20 MG/ML IJ SOLN: 5.0000 mg | INTRAMUSCULAR | Status: DC | PRN

## 2016-11-10 MED ORDER — SODIUM CHLORIDE 0.9 % IV BOLUS (SEPSIS)
250.0000 mL | Freq: Once | INTRAVENOUS | Status: AC
  Administered 2016-11-10: 250 mL via INTRAVENOUS

## 2016-11-10 MED ORDER — FENTANYL CITRATE (PF) 100 MCG/2ML IJ SOLN
INTRAMUSCULAR | Status: AC
  Filled 2016-11-10: qty 2

## 2016-11-10 MED ORDER — HEPARIN SODIUM (PORCINE) 1000 UNIT/ML IJ SOLN
INTRAMUSCULAR | Status: DC | PRN
  Administered 2016-11-10: 3000 [IU] via INTRAVENOUS

## 2016-11-10 MED ORDER — ACETAMINOPHEN 325 MG RE SUPP: 325.0000 mg | RECTAL | Status: DC | PRN

## 2016-11-10 MED ORDER — SODIUM CHLORIDE 0.9 % IV SOLN: 500.0000 mL | Freq: Once | INTRAVENOUS | Status: DC | PRN

## 2016-11-10 MED ORDER — HEPARIN (PORCINE) IN NACL 2-0.9 UNIT/ML-% IJ SOLN
INTRAMUSCULAR | Status: AC
  Filled 2016-11-10: qty 1000

## 2016-11-10 MED ORDER — PHENOL 1.4 % MT LIQD: 1.0000 | OROMUCOSAL | Status: DC | PRN

## 2016-11-10 MED ORDER — LABETALOL HCL 5 MG/ML IV SOLN: 10.0000 mg | INTRAVENOUS | Status: DC | PRN

## 2016-11-10 MED ORDER — ONDANSETRON HCL 4 MG/2ML IJ SOLN: 4.0000 mg | Freq: Four times a day (QID) | INTRAMUSCULAR | Status: DC | PRN

## 2016-11-10 MED ORDER — CLOPIDOGREL BISULFATE 300 MG PO TABS: 600.0000 mg | ORAL_TABLET | Freq: Every day | ORAL | Status: DC

## 2016-11-10 SURGICAL SUPPLY — 19 items
BALLN MUSTANG 9X40X75 (BALLOONS) ×3
BALLOON MUSTANG 9X40X75 (BALLOONS) IMPLANT
CATH OMNI FLUSH 5F 65CM (CATHETERS) ×1 IMPLANT
CATH STRAIGHT 5FR 65CM (CATHETERS) ×1 IMPLANT
GUIDEWIRE ANGLED .035X150CM (WIRE) ×1 IMPLANT
KIT ENCORE 26 ADVANTAGE (KITS) ×1 IMPLANT
KIT MICROINTRODUCER STIFF 5F (SHEATH) ×1 IMPLANT
KIT PV (KITS) ×3 IMPLANT
SHEATH BRITE TIP 7FR 35CM (SHEATH) ×1 IMPLANT
SHEATH PINNACLE 5F 10CM (SHEATH) ×1 IMPLANT
STENT ABSOLUTE 10X30X135 (Permanent Stent) ×1 IMPLANT
STOPCOCK MORSE 400PSI 3WAY (MISCELLANEOUS) ×1 IMPLANT
SYRINGE MEDRAD AVANTA MACH 7 (SYRINGE) ×1 IMPLANT
TAPE RADIOPAQUE TURBO (MISCELLANEOUS) ×1 IMPLANT
TRANSDUCER W/STOPCOCK (MISCELLANEOUS) ×3 IMPLANT
TRAY PV CATH (CUSTOM PROCEDURE TRAY) ×3 IMPLANT
TUBING CIL FLEX 10 FLL-RA (TUBING) ×1 IMPLANT
WIRE HITORQ VERSACORE ST 145CM (WIRE) ×1 IMPLANT
WIRE VERSACORE LOC 115CM (WIRE) ×1 IMPLANT

## 2016-11-10 NOTE — Progress Notes (Signed)
Order for sheath removal verified per post procedural orders. Procedure explained to patient and RIGHT FEMORAL artery access site assessed: level 0, palpable dorsalis pedis and posterior tibial pulses. 7French Sheath removed and manual pressure applied for 25 minutes. Pre, peri, & post procedural vitals: HR 74, RR 16, O2 Sat 94%, BP 134/54, Pain level 0. Distal pulses remained intact after sheath removal PER DOPPLER. Access site level 0 and dressed with 4X4 gauze and tegaderm.  Luan Moore, RN confirmed condition of site. Post procedural instructions discussed with return demonstration from patient.

## 2016-11-10 NOTE — Interval H&P Note (Signed)
History and Physical Interval Note:  11/10/2016 8:35 AM  Mallory Thomas  has presented today for surgery, with the diagnosis of claudication  The various methods of treatment have been discussed with the patient and family. After consideration of risks, benefits and other options for treatment, the patient has consented to  Procedure(s): Lower Extremity Angiography (N/A) and possible angioplasty as a surgical intervention .  The patient's history has been reviewed, patient examined, no change in status, stable for surgery.  I have reviewed the patient's chart and labs.  Questions were answered to the patient's satisfaction.     Adrian Prows

## 2016-11-10 NOTE — Progress Notes (Signed)
Up to bathroom and tolerated well; right groin bruised; right groin soft and non-tender

## 2016-11-10 NOTE — Progress Notes (Signed)
Dr Einar Gip notified of b/p and order noted

## 2016-11-10 NOTE — Discharge Instructions (Signed)
Angiogram, Care After °These instructions give you information about caring for yourself after your procedure. Your doctor may also give you more specific instructions. Call your doctor if you have any problems or questions after your procedure. °Follow these instructions at home: °· Take medicines only as told by your doctor. °· Follow your doctor's instructions about: °¨ Care of the area where the tube was inserted. °¨ Bandage (dressing) changes and removal. °· You may shower 24-48 hours after the procedure or as told by your doctor. °· Do not take baths, swim, or use a hot tub until your doctor approves. °· Every day, check the area where the tube was inserted. Watch for: °¨ Redness, swelling, or pain. °¨ Fluid, blood, or pus. °· Do not apply powder or lotion to the site. °· Do not lift anything that is heavier than 10 lb (4.5 kg) for 5 days or as told by your doctor. °· Ask your doctor when you can: °¨ Return to work or school. °¨ Do physical activities or play sports. °¨ Have sex. °· Do not drive or operate heavy machinery for 24 hours or as told by your doctor. °· Have someone with you for the first 24 hours after the procedure. °· Keep all follow-up visits as told by your doctor. This is important. °Contact a health care provider if: °· You have a fever. °· You have chills. °· You have more bleeding from the area where the tube was inserted. Hold pressure on the area. °· You have redness, swelling, or pain in the area where the tube was inserted. °· You have fluid or pus coming from the area. °Get help right away if: °· You have a lot of pain in the area where the tube was inserted. °· The area where the tube was inserted is bleeding, and the bleeding does not stop after 30 minutes of holding steady pressure on the area. °· The area near or just beyond the insertion site becomes pale, cool, tingly, or numb. °This information is not intended to replace advice given to you by your health care provider. Make  sure you discuss any questions you have with your health care provider. °Document Released: 01/29/2009 Document Revised: 04/09/2016 Document Reviewed: 04/05/2013 °Elsevier Interactive Patient Education © 2017 [employer] ° °

## 2016-11-12 DIAGNOSIS — I739 Peripheral vascular disease, unspecified: Secondary | ICD-10-CM | POA: Diagnosis not present

## 2016-11-18 DIAGNOSIS — I350 Nonrheumatic aortic (valve) stenosis: Secondary | ICD-10-CM | POA: Diagnosis not present

## 2016-11-18 DIAGNOSIS — I739 Peripheral vascular disease, unspecified: Secondary | ICD-10-CM | POA: Diagnosis not present

## 2016-12-09 DIAGNOSIS — L820 Inflamed seborrheic keratosis: Secondary | ICD-10-CM | POA: Diagnosis not present

## 2016-12-09 DIAGNOSIS — D225 Melanocytic nevi of trunk: Secondary | ICD-10-CM | POA: Diagnosis not present

## 2016-12-09 DIAGNOSIS — L570 Actinic keratosis: Secondary | ICD-10-CM | POA: Diagnosis not present

## 2016-12-09 DIAGNOSIS — Z1283 Encounter for screening for malignant neoplasm of skin: Secondary | ICD-10-CM | POA: Diagnosis not present

## 2016-12-09 DIAGNOSIS — X32XXXD Exposure to sunlight, subsequent encounter: Secondary | ICD-10-CM | POA: Diagnosis not present

## 2017-01-20 DIAGNOSIS — E119 Type 2 diabetes mellitus without complications: Secondary | ICD-10-CM | POA: Diagnosis not present

## 2017-01-20 DIAGNOSIS — I10 Essential (primary) hypertension: Secondary | ICD-10-CM | POA: Diagnosis not present

## 2017-01-20 DIAGNOSIS — L821 Other seborrheic keratosis: Secondary | ICD-10-CM | POA: Diagnosis not present

## 2017-01-20 DIAGNOSIS — L820 Inflamed seborrheic keratosis: Secondary | ICD-10-CM | POA: Diagnosis not present

## 2017-01-27 DIAGNOSIS — E119 Type 2 diabetes mellitus without complications: Secondary | ICD-10-CM | POA: Diagnosis not present

## 2017-01-27 DIAGNOSIS — I10 Essential (primary) hypertension: Secondary | ICD-10-CM | POA: Diagnosis not present

## 2017-01-27 DIAGNOSIS — E7800 Pure hypercholesterolemia, unspecified: Secondary | ICD-10-CM | POA: Diagnosis not present

## 2017-01-27 DIAGNOSIS — I739 Peripheral vascular disease, unspecified: Secondary | ICD-10-CM | POA: Diagnosis not present

## 2017-02-04 DIAGNOSIS — E119 Type 2 diabetes mellitus without complications: Secondary | ICD-10-CM | POA: Diagnosis not present

## 2017-04-05 ENCOUNTER — Other Ambulatory Visit: Payer: Self-pay | Admitting: Oncology

## 2017-04-05 DIAGNOSIS — Z1231 Encounter for screening mammogram for malignant neoplasm of breast: Secondary | ICD-10-CM

## 2017-04-08 DIAGNOSIS — A09 Infectious gastroenteritis and colitis, unspecified: Secondary | ICD-10-CM | POA: Diagnosis not present

## 2017-04-12 DIAGNOSIS — M25572 Pain in left ankle and joints of left foot: Secondary | ICD-10-CM | POA: Diagnosis not present

## 2017-04-12 DIAGNOSIS — S93402A Sprain of unspecified ligament of left ankle, initial encounter: Secondary | ICD-10-CM | POA: Diagnosis not present

## 2017-04-12 DIAGNOSIS — I10 Essential (primary) hypertension: Secondary | ICD-10-CM | POA: Diagnosis not present

## 2017-04-12 DIAGNOSIS — E119 Type 2 diabetes mellitus without complications: Secondary | ICD-10-CM | POA: Diagnosis not present

## 2017-04-22 DIAGNOSIS — I10 Essential (primary) hypertension: Secondary | ICD-10-CM | POA: Diagnosis not present

## 2017-04-22 DIAGNOSIS — E119 Type 2 diabetes mellitus without complications: Secondary | ICD-10-CM | POA: Diagnosis not present

## 2017-04-29 DIAGNOSIS — E7800 Pure hypercholesterolemia, unspecified: Secondary | ICD-10-CM | POA: Diagnosis not present

## 2017-04-29 DIAGNOSIS — E119 Type 2 diabetes mellitus without complications: Secondary | ICD-10-CM | POA: Diagnosis not present

## 2017-04-29 DIAGNOSIS — I10 Essential (primary) hypertension: Secondary | ICD-10-CM | POA: Diagnosis not present

## 2017-05-20 ENCOUNTER — Ambulatory Visit
Admission: RE | Admit: 2017-05-20 | Discharge: 2017-05-20 | Disposition: A | Discharge status: Home / Self Care | Payer: Medicare Other | Source: Ambulatory Visit | Attending: Oncology | Admitting: Oncology

## 2017-05-20 DIAGNOSIS — Z1231 Encounter for screening mammogram for malignant neoplasm of breast: Secondary | ICD-10-CM

## 2017-05-26 DIAGNOSIS — I350 Nonrheumatic aortic (valve) stenosis: Secondary | ICD-10-CM | POA: Diagnosis not present

## 2017-06-03 DIAGNOSIS — I10 Essential (primary) hypertension: Secondary | ICD-10-CM | POA: Diagnosis not present

## 2017-06-03 DIAGNOSIS — I739 Peripheral vascular disease, unspecified: Secondary | ICD-10-CM | POA: Diagnosis not present

## 2017-06-03 DIAGNOSIS — I350 Nonrheumatic aortic (valve) stenosis: Secondary | ICD-10-CM | POA: Diagnosis not present

## 2017-06-16 DIAGNOSIS — I739 Peripheral vascular disease, unspecified: Secondary | ICD-10-CM | POA: Diagnosis not present

## 2017-06-24 ENCOUNTER — Other Ambulatory Visit (HOSPITAL_BASED_OUTPATIENT_CLINIC_OR_DEPARTMENT_OTHER): Payer: Medicare Other

## 2017-06-24 DIAGNOSIS — Z853 Personal history of malignant neoplasm of breast: Secondary | ICD-10-CM | POA: Diagnosis not present

## 2017-06-24 DIAGNOSIS — C50911 Malignant neoplasm of unspecified site of right female breast: Secondary | ICD-10-CM

## 2017-06-24 LAB — CBC WITH DIFFERENTIAL/PLATELET
BASO%: 0.4 % (ref 0.0–2.0)
Basophils Absolute: 0 10*3/uL (ref 0.0–0.1)
EOS%: 3.1 % (ref 0.0–7.0)
Eosinophils Absolute: 0.1 10*3/uL (ref 0.0–0.5)
HCT: 38.1 % (ref 34.8–46.6)
HGB: 12 g/dL (ref 11.6–15.9)
LYMPH%: 25.8 % (ref 14.0–49.7)
MCH: 30.6 pg (ref 25.1–34.0)
MCHC: 31.5 g/dL (ref 31.5–36.0)
MCV: 97.2 fL (ref 79.5–101.0)
MONO#: 0.4 10*3/uL (ref 0.1–0.9)
MONO%: 8.8 % (ref 0.0–14.0)
NEUT#: 2.8 10*3/uL (ref 1.5–6.5)
NEUT%: 61.9 % (ref 38.4–76.8)
Platelets: 154 10*3/uL (ref 145–400)
RBC: 3.92 10*6/uL (ref 3.70–5.45)
RDW: 14.5 % (ref 11.2–14.5)
WBC: 4.5 10*3/uL (ref 3.9–10.3)
lymph#: 1.2 10*3/uL (ref 0.9–3.3)

## 2017-06-24 LAB — COMPREHENSIVE METABOLIC PANEL
ALT: 7 U/L (ref 0–55)
AST: 15 U/L (ref 5–34)
Albumin: 3.8 g/dL (ref 3.5–5.0)
Alkaline Phosphatase: 50 U/L (ref 40–150)
Anion Gap: 8 mEq/L (ref 3–11)
BUN: 25.6 mg/dL (ref 7.0–26.0)
CO2: 26 mEq/L (ref 22–29)
Calcium: 9.1 mg/dL (ref 8.4–10.4)
Chloride: 107 mEq/L (ref 98–109)
Creatinine: 1.3 mg/dL — ABNORMAL HIGH (ref 0.6–1.1)
EGFR: 38 mL/min/{1.73_m2} — ABNORMAL LOW (ref >90)
Glucose: 115 mg/dl (ref 70–140)
Potassium: 4.8 mEq/L (ref 3.5–5.1)
Sodium: 141 mEq/L (ref 136–145)
Total Bilirubin: 0.43 mg/dL (ref 0.20–1.20)
Total Protein: 7 g/dL (ref 6.4–8.3)

## 2017-07-01 ENCOUNTER — Ambulatory Visit (HOSPITAL_BASED_OUTPATIENT_CLINIC_OR_DEPARTMENT_OTHER): Payer: Medicare Other | Admitting: Oncology

## 2017-07-01 VITALS — BP 129/75 | HR 98 | Temp 98.1°F | Resp 18 | Ht 62.0 in | Wt 179.2 lb

## 2017-07-01 DIAGNOSIS — N183 Chronic kidney disease, stage 3 unspecified: Secondary | ICD-10-CM

## 2017-07-01 DIAGNOSIS — Z853 Personal history of malignant neoplasm of breast: Secondary | ICD-10-CM

## 2017-07-01 DIAGNOSIS — I350 Nonrheumatic aortic (valve) stenosis: Secondary | ICD-10-CM | POA: Insufficient documentation

## 2017-07-01 DIAGNOSIS — Z170 Estrogen receptor positive status [ER+]: Secondary | ICD-10-CM | POA: Insufficient documentation

## 2017-07-01 DIAGNOSIS — C50811 Malignant neoplasm of overlapping sites of right female breast: Secondary | ICD-10-CM

## 2017-07-01 NOTE — Progress Notes (Signed)
ID: Mallory Thomas   DOB: Dec 05, 1931  MR#: [id number]  VHQ#:[id number]  Patient Care Team: Jani Gravel, MD as PCP - General (Internal Medicine) Thelmer Legler, Virgie Dad, MD (Hematology and Oncology) Taraya Steward, Virgie Dad, MD as Consulting Physician (Oncology) Clarene Essex, MD as Consulting Physician (Gastroenterology) Adrian Prows, MD as Consulting Physician (Cardiology)   CHIEF COMPLAINT: Right-sided breast cancer  CURRENT TREATMENT: Observation  HISTORY OF PRESENT ILLNESS: From the original intake note:  Screening mammogram at [facility] showed a suspicious lesion in her right breast.  She was then referred to the [facility] where ultrasound was performed and core needle biopsy was obtained.  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 Annamaria Boots for further evaluation.  On 04/30/03, she underwent right lumpectomy with axillary lymph node biopsy and the final pathology ([id number]) showed an infiltrating ductal carcinoma 0.9 cm on this study, with one sentinel lymph node negative for spread of disease.  Grade was 2.  There was no evidence of vascular or lymphatic invasion.  There was no extensive intraductal component.  Note that the patient's core biopsy which measured 1.2 cm, although that is not stated in the original pathology report, is being used as the "T" portion of the patient's staging record.  Her subsequent history is as detailed below.  INTERVAL HISTORY: Mallory Thomas returns today for follow-up of her remote estrogen receptor positive breast cancer. From that point of view she is doing fine. She just had mammography in July. It shows no evidence of malignancy and breasts that are not dense.  She had stenting for claudication under Dr. Nadyne Coombes and that went well. She tells me she is scheduled for aortic valve repair or bulge a lot of me sometime early next year.  REVIEW OF SYSTEMS: She does water  aerobics several days during the week. She also does a lot of sewing. She thinks her blood sugars are well controlled but is not quite sure. She tells me is currently on study regarding that. She drinks at least a quart a day usually one and a half quarts. A detailed review of systems today was otherwise stable  PAST MEDICAL HISTORY: Past Medical History:  Diagnosis Date  . Breast cancer ([place]) 2004   right  . Diabetes mellitus   . Heart murmur   . Hyperlipidemia   . Hypertension   history cat scratch disease Squamous cell skin cancers (Dr Lindwood Coke)  PAST SURGICAL HISTORY: Past Surgical History:  Procedure Laterality Date  . BILATERAL KNEE ARTHROSCOPY    . BREAST LUMPECTOMY Right 2004   radiation   . lymph node removal right arm    . PERIPHERAL VASCULAR CATHETERIZATION N/A 11/10/2016   Procedure: Lower Extremity Angiography;  Surgeon: Adrian Prows, MD;  Location: [facility];  Service: Cardiovascular;  Laterality: N/A;  . PERIPHERAL VASCULAR CATHETERIZATION Right 11/10/2016   Procedure: Peripheral Vascular Intervention;  Surgeon: Adrian Prows, MD;  Location: [facility];  Service: Cardiovascular;  Laterality: Right;  Rt Com Iliac    FAMILY HISTORY The patient's family were [employer].  Her father died at the age of 67 from a heart attack.  Her mother died at age 65, and she had a breast cancer diagnosed at age 88.  She had a mastectomy and Tamoxifen at that time.   GYNECOLOGIC HISTORY: She is G3, F3, P0, A0, L3.  Menopause age 68.  She never took hormones.  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  Her husband died in the year 1999-02-07.  He had lung cancer and was taken care of by Dr. Ralene Ok.  Her children are Heidi (who lives in [place] and sells medications to labs); Shanon Brow (who lives in [place] and is in [employer]); and Medical sales representative (who lives in [place] and is currently a homemaker.).  The  patient has three grandsons: Barnabas Lister, 44; Case, 14; and Nicholas; 8.  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  She lives alone with her cat Barth Kirks   ADVANCED DIRECTIVES: In place  HEALTH MAINTENANCE: Social History  Substance Use Topics  . Smoking status: Former Smoker    Quit date: 06/04/1968  . Smokeless tobacco: Not on file  . Alcohol use Not on file     Comment: occasional     Colonoscopy: 11/01/2015/i Magodin the assessment and however you and seeing Berta Minor today and she was in 62 and have a unblinded that she keeps asking me when she took 5 cancer can will I am seeing her molars almost it's that extra little bit of that field with  PAP:  Bone density: February 06, 2013  Lipid panel:  Allergies  Allergen Reactions  . Fish Allergy Hives  . Shellfish Allergy Hives  . Tape Hives  . Zetia [Ezetimibe] Diarrhea    Current Outpatient Prescriptions  Medication Sig Dispense Refill  . aspirin 81 MG tablet Take 81 mg by mouth at bedtime.     Marland Kitchen atorvastatin (LIPITOR) 80 MG tablet Take 80 mg by mouth every evening.     . clopidogrel (PLAVIX) 75 MG tablet Take 1 tablet (75 mg total) by mouth daily. 90 tablet 0  . colestipol (COLESTID) 1 g tablet Take 1 g by mouth daily at 12 noon.    . irbesartan (AVAPRO) 300 MG tablet Take 300 mg by mouth daily.     . saxagliptin HCl (ONGLYZA) 5 MG TABS tablet Take 1 tablet (5 mg total) by mouth daily. 30 tablet    No current facility-administered medications for this visit.     OBJECTIVE: Older white woman Who appears well  Vitals:   07/01/17 1240  BP: 129/75  Pulse: 98  Resp: 18  Temp: 98.1 F (36.7 C)  SpO2: 100%     Body mass index is 32.78 kg/m.    ECOG FS: 0  Sclerae unicteric, EOMs intact Oropharynx clear and moist No cervical or supraclavicular adenopathy Lungs no rales or rhonchi Heart regular rate and rhythm Abd soft, nontender, positive bowel sounds MSK no focal spinal tenderness, no upper extremity  lymphedema Neuro: nonfocal, well oriented, appropriate affect Breasts: The right breast has undergone lumpectomy, with no evidence of local recurrence. The left breast is benign. Both axillae are benign.  LAB RESULTS: Lab Results  Component Value Date   WBC 4.5 06/24/2017   NEUTROABS 2.8 06/24/2017   HGB 12.0 06/24/2017   HCT 38.1 06/24/2017   MCV 97.2 06/24/2017   PLT 154 06/24/2017      Chemistry      Component Value Date/Time   NA 141 06/24/2017 1333   K 4.8 06/24/2017 1333   CL 105 06/25/2014 1106   CO2 26 06/24/2017 1333   BUN 25.6 06/24/2017 1333   CREATININE 1.3 (H) 06/24/2017 1333      Component Value Date/Time   CALCIUM 9.1 06/24/2017 1333   ALKPHOS 50 06/24/2017 1333   AST 15  06/24/2017 1333   ALT 7 06/24/2017 1333   BILITOT 0.43 06/24/2017 1333       Lab Results  Component Value Date   LABCA2 13 05/17/2008    No components found for: ZOXWR604  No results for input(s): INR in the last 168 hours.  Urinalysis No results found for: COLORURINE  STUDIES: Bilateral screening mammography at the [facility] 05/20/2017 finds a breast density to be category B. There was no evidence of malignancy.  ASSESSMENT: 83 y.o. [place] woman status post right lumpectomy and sentinel lymph node biopsy June 2004 for a 1.2 cm estrogen and progesterone receptor positive, HER2/neu negative, grade 2 invasive ductal carcinoma, lymph node negative,   (1) on Arimidex between July 2004 and July 2009 through the MA27 study, note that the study still is unblinded so we do not know if she took 5 or 10 years of aromatase inhibitors  (2) enrolled in the B42 study as of July 2009 and is either receiving placebo or letrozole.   (3) normal bone density 09/13/2013 at [facility].  (4) CKD stage III  PLAN:   Mallory Thomas Is now 14 years out from definitive surgery for her breast cancer with no evidence of disease recurrence. This is very favorable.  She has an  excellent functional status for her age and it makes sense to me that she would want to optimize her cardiac risk factors and daycare of her aortic valve if that is felt to be necessary by her cardiologist.  The 1 concern I have in her case is her chronic kidney injury. This slowly but relentlessly seems to be worsening. I have asked her to drink between one and a half and 2 quarts of fluid a day and avoid dehydration. I reviewed her medicines and don't see anything that obviously might be worsening things. Of course if she does receive in a large dose of contrast for her procedures that could be a challenge to her kidneys  She is aware of this  Otherwise she will see me again in one year. She knows to call for any problems that may develop before the next visit here.   Wing Schoch C    07/01/2017

## 2017-07-22 DIAGNOSIS — E119 Type 2 diabetes mellitus without complications: Secondary | ICD-10-CM | POA: Diagnosis not present

## 2017-07-22 DIAGNOSIS — I10 Essential (primary) hypertension: Secondary | ICD-10-CM | POA: Diagnosis not present

## 2017-07-28 DIAGNOSIS — Z0000 Encounter for general adult medical examination without abnormal findings: Secondary | ICD-10-CM | POA: Diagnosis not present

## 2017-07-28 DIAGNOSIS — E119 Type 2 diabetes mellitus without complications: Secondary | ICD-10-CM | POA: Diagnosis not present

## 2017-07-28 DIAGNOSIS — I10 Essential (primary) hypertension: Secondary | ICD-10-CM | POA: Diagnosis not present

## 2017-07-28 DIAGNOSIS — Z23 Encounter for immunization: Secondary | ICD-10-CM | POA: Diagnosis not present

## 2017-07-28 DIAGNOSIS — E7800 Pure hypercholesterolemia, unspecified: Secondary | ICD-10-CM | POA: Diagnosis not present

## 2017-10-21 DIAGNOSIS — H524 Presbyopia: Secondary | ICD-10-CM | POA: Diagnosis not present

## 2017-10-21 DIAGNOSIS — H3530 Unspecified macular degeneration: Secondary | ICD-10-CM | POA: Diagnosis not present

## 2017-10-21 DIAGNOSIS — E119 Type 2 diabetes mellitus without complications: Secondary | ICD-10-CM | POA: Diagnosis not present

## 2017-10-21 DIAGNOSIS — Z961 Presence of intraocular lens: Secondary | ICD-10-CM | POA: Diagnosis not present

## 2017-10-22 DIAGNOSIS — Z0000 Encounter for general adult medical examination without abnormal findings: Secondary | ICD-10-CM | POA: Diagnosis not present

## 2017-10-22 DIAGNOSIS — I10 Essential (primary) hypertension: Secondary | ICD-10-CM | POA: Diagnosis not present

## 2017-10-22 DIAGNOSIS — E119 Type 2 diabetes mellitus without complications: Secondary | ICD-10-CM | POA: Diagnosis not present

## 2017-10-27 DIAGNOSIS — C50911 Malignant neoplasm of unspecified site of right female breast: Secondary | ICD-10-CM | POA: Diagnosis not present

## 2017-10-27 DIAGNOSIS — I10 Essential (primary) hypertension: Secondary | ICD-10-CM | POA: Diagnosis not present

## 2017-10-27 DIAGNOSIS — Z0000 Encounter for general adult medical examination without abnormal findings: Secondary | ICD-10-CM | POA: Diagnosis not present

## 2017-10-27 DIAGNOSIS — E7800 Pure hypercholesterolemia, unspecified: Secondary | ICD-10-CM | POA: Diagnosis not present

## 2017-10-27 DIAGNOSIS — E119 Type 2 diabetes mellitus without complications: Secondary | ICD-10-CM | POA: Diagnosis not present

## 2017-12-08 DIAGNOSIS — I350 Nonrheumatic aortic (valve) stenosis: Secondary | ICD-10-CM | POA: Diagnosis not present

## 2017-12-16 DIAGNOSIS — I739 Peripheral vascular disease, unspecified: Secondary | ICD-10-CM | POA: Diagnosis not present

## 2017-12-16 DIAGNOSIS — I10 Essential (primary) hypertension: Secondary | ICD-10-CM | POA: Diagnosis not present

## 2017-12-16 DIAGNOSIS — I350 Nonrheumatic aortic (valve) stenosis: Secondary | ICD-10-CM | POA: Diagnosis not present

## 2017-12-16 DIAGNOSIS — E7800 Pure hypercholesterolemia, unspecified: Secondary | ICD-10-CM | POA: Diagnosis not present

## 2017-12-24 ENCOUNTER — Encounter (HOSPITAL_COMMUNITY)
Admission: RE | Disposition: A | Discharge status: Home / Self Care | Payer: Self-pay | Source: Ambulatory Visit | Attending: Cardiology

## 2017-12-24 ENCOUNTER — Ambulatory Visit (HOSPITAL_COMMUNITY)
Admission: RE | Admit: 2017-12-24 | Discharge: 2017-12-24 | Disposition: A | Discharge status: Home / Self Care | Payer: Medicare Other | Source: Ambulatory Visit | Attending: Cardiology | Admitting: Cardiology

## 2017-12-24 DIAGNOSIS — Z7982 Long term (current) use of aspirin: Secondary | ICD-10-CM | POA: Diagnosis not present

## 2017-12-24 DIAGNOSIS — Z8249 Family history of ischemic heart disease and other diseases of the circulatory system: Secondary | ICD-10-CM | POA: Insufficient documentation

## 2017-12-24 DIAGNOSIS — I2584 Coronary atherosclerosis due to calcified coronary lesion: Secondary | ICD-10-CM | POA: Diagnosis not present

## 2017-12-24 DIAGNOSIS — I350 Nonrheumatic aortic (valve) stenosis: Secondary | ICD-10-CM

## 2017-12-24 DIAGNOSIS — E7800 Pure hypercholesterolemia, unspecified: Secondary | ICD-10-CM | POA: Diagnosis not present

## 2017-12-24 DIAGNOSIS — I2510 Atherosclerotic heart disease of native coronary artery without angina pectoris: Secondary | ICD-10-CM | POA: Insufficient documentation

## 2017-12-24 DIAGNOSIS — I10 Essential (primary) hypertension: Secondary | ICD-10-CM | POA: Insufficient documentation

## 2017-12-24 DIAGNOSIS — Z87891 Personal history of nicotine dependence: Secondary | ICD-10-CM | POA: Insufficient documentation

## 2017-12-24 DIAGNOSIS — Z923 Personal history of irradiation: Secondary | ICD-10-CM | POA: Diagnosis not present

## 2017-12-24 DIAGNOSIS — I739 Peripheral vascular disease, unspecified: Secondary | ICD-10-CM | POA: Diagnosis not present

## 2017-12-24 HISTORY — PX: INTRAVASCULAR ULTRASOUND/IVUS: CATH118244

## 2017-12-24 LAB — POCT I-STAT 3, ART BLOOD GAS (G3+)
Acid-base deficit: 1 mmol/L (ref 0.0–2.0)
Bicarbonate: 23.7 mmol/L (ref 20.0–28.0)
O2 Saturation: 94 %
TCO2: 25 mmol/L (ref 22–32)
pCO2 arterial: 41 mmHg (ref 32.0–48.0)
pH, Arterial: 7.371 (ref 7.350–7.450)
pO2, Arterial: 75 mmHg — ABNORMAL LOW (ref 83.0–108.0)

## 2017-12-24 LAB — POCT I-STAT 3, VENOUS BLOOD GAS (G3P V)
Acid-base deficit: 3 mmol/L — ABNORMAL HIGH (ref 0.0–2.0)
Bicarbonate: 23.6 mmol/L (ref 20.0–28.0)
O2 Saturation: 65 %
TCO2: 25 mmol/L (ref 22–32)
pCO2, Ven: 46.4 mmHg (ref 44.0–60.0)
pH, Ven: 7.314 (ref 7.250–7.430)
pO2, Ven: 37 mmHg (ref 32.0–45.0)

## 2017-12-24 LAB — POCT ACTIVATED CLOTTING TIME
Activated Clotting Time: 257 seconds
Activated Clotting Time: 263 seconds
Activated Clotting Time: 191 seconds
Activated Clotting Time: 208 seconds

## 2017-12-24 LAB — GLUCOSE, CAPILLARY
Glucose-Capillary: 155 mg/dL — ABNORMAL HIGH (ref 65–99)
Glucose-Capillary: 136 mg/dL — ABNORMAL HIGH (ref 65–99)

## 2017-12-24 SURGERY — INTRAVASCULAR ULTRASOUND/IVUS
Anesthesia: LOCAL

## 2017-12-24 MED ORDER — SODIUM CHLORIDE 0.9 % WEIGHT BASED INFUSION: 1.0000 mL/kg/h | INTRAVENOUS | Status: DC

## 2017-12-24 MED ORDER — HEPARIN (PORCINE) IN NACL 2-0.9 UNIT/ML-% IJ SOLN
INTRAMUSCULAR | Status: AC | PRN
  Administered 2017-12-24: 1000 mL

## 2017-12-24 MED ORDER — FAMOTIDINE IN NACL 20-0.9 MG/50ML-% IV SOLN
INTRAVENOUS | Status: AC | PRN
  Administered 2017-12-24: 20 mg via INTRAVENOUS

## 2017-12-24 MED ORDER — LIDOCAINE HCL 1 % IJ SOLN
INTRAMUSCULAR | Status: AC
  Filled 2017-12-24: qty 20

## 2017-12-24 MED ORDER — IOPAMIDOL (ISOVUE-370) INJECTION 76%
INTRAVENOUS | Status: DC | PRN
  Administered 2017-12-24: 100 mL via INTRAVENOUS

## 2017-12-24 MED ORDER — HEPARIN SODIUM (PORCINE) 1000 UNIT/ML IJ SOLN
INTRAMUSCULAR | Status: AC
  Filled 2017-12-24: qty 1

## 2017-12-24 MED ORDER — SODIUM CHLORIDE 0.9% FLUSH: 3.0000 mL | INTRAVENOUS | Status: DC | PRN

## 2017-12-24 MED ORDER — DIPHENHYDRAMINE HCL 50 MG/ML IJ SOLN
INTRAMUSCULAR | Status: DC | PRN
  Administered 2017-12-24: 12.5 mg via INTRAVENOUS

## 2017-12-24 MED ORDER — ASPIRIN 81 MG PO CHEW
81.0000 mg | CHEWABLE_TABLET | ORAL | Status: AC
  Administered 2017-12-24: 81 mg via ORAL

## 2017-12-24 MED ORDER — ASPIRIN 81 MG PO CHEW
CHEWABLE_TABLET | ORAL | Status: AC
  Filled 2017-12-24: qty 1

## 2017-12-24 MED ORDER — CLOPIDOGREL BISULFATE 75 MG PO TABS: 75.0000 mg | ORAL_TABLET | ORAL | Status: DC

## 2017-12-24 MED ORDER — LABETALOL HCL 5 MG/ML IV SOLN
INTRAVENOUS | Status: AC
  Filled 2017-12-24: qty 4

## 2017-12-24 MED ORDER — DIPHENHYDRAMINE HCL 50 MG/ML IJ SOLN
INTRAMUSCULAR | Status: AC
  Filled 2017-12-24: qty 1

## 2017-12-24 MED ORDER — MIDAZOLAM HCL 2 MG/2ML IJ SOLN
INTRAMUSCULAR | Status: AC
  Filled 2017-12-24: qty 2

## 2017-12-24 MED ORDER — FENTANYL CITRATE (PF) 100 MCG/2ML IJ SOLN
INTRAMUSCULAR | Status: AC
  Filled 2017-12-24: qty 2

## 2017-12-24 MED ORDER — HEPARIN SODIUM (PORCINE) 1000 UNIT/ML IJ SOLN
INTRAMUSCULAR | Status: DC | PRN
  Administered 2017-12-24: 2000 [IU] via INTRAVENOUS
  Administered 2017-12-24: 3000 [IU] via INTRAVENOUS
  Administered 2017-12-24: 6000 [IU] via INTRAVENOUS

## 2017-12-24 MED ORDER — SODIUM CHLORIDE 0.9% FLUSH: 3.0000 mL | Freq: Two times a day (BID) | INTRAVENOUS | Status: DC

## 2017-12-24 MED ORDER — SODIUM CHLORIDE 0.9 % IV SOLN: 250.0000 mL | INTRAVENOUS | Status: DC | PRN

## 2017-12-24 MED ORDER — VERAPAMIL HCL 2.5 MG/ML IV SOLN
INTRAVENOUS | Status: AC
  Filled 2017-12-24: qty 2

## 2017-12-24 MED ORDER — HEPARIN (PORCINE) IN NACL 2-0.9 UNIT/ML-% IJ SOLN
INTRAMUSCULAR | Status: AC
  Filled 2017-12-24: qty 1000

## 2017-12-24 MED ORDER — VERAPAMIL HCL 2.5 MG/ML IV SOLN
INTRAVENOUS | Status: DC | PRN
  Administered 2017-12-24: 08:00:00 via INTRA_ARTERIAL

## 2017-12-24 MED ORDER — FENTANYL CITRATE (PF) 100 MCG/2ML IJ SOLN
INTRAMUSCULAR | Status: DC | PRN
  Administered 2017-12-24: 25 ug via INTRAVENOUS

## 2017-12-24 MED ORDER — FAMOTIDINE IN NACL 20-0.9 MG/50ML-% IV SOLN
INTRAVENOUS | Status: AC
  Filled 2017-12-24: qty 50

## 2017-12-24 MED ORDER — LIDOCAINE HCL (PF) 1 % IJ SOLN
INTRAMUSCULAR | Status: DC | PRN
  Administered 2017-12-24: 5 mL
  Administered 2017-12-24: 15 mL

## 2017-12-24 MED ORDER — HYDRALAZINE HCL 20 MG/ML IJ SOLN: 10.0000 mg | INTRAMUSCULAR | Status: DC | PRN

## 2017-12-24 MED ORDER — IOPAMIDOL (ISOVUE-370) INJECTION 76%
INTRAVENOUS | Status: AC
  Filled 2017-12-24: qty 50

## 2017-12-24 MED ORDER — LABETALOL HCL 5 MG/ML IV SOLN
20.0000 mg | Freq: Once | INTRAVENOUS | Status: AC
  Administered 2017-12-24: 10 mg via INTRAVENOUS

## 2017-12-24 MED ORDER — MIDAZOLAM HCL 2 MG/2ML IJ SOLN
INTRAMUSCULAR | Status: DC | PRN
  Administered 2017-12-24: 2 mg via INTRAVENOUS

## 2017-12-24 MED ORDER — LIDOCAINE-EPINEPHRINE 1 %-1:100000 IJ SOLN
INTRAMUSCULAR | Status: AC
  Filled 2017-12-24: qty 1

## 2017-12-24 MED ORDER — SODIUM CHLORIDE 0.9 % WEIGHT BASED INFUSION
3.0000 mL/kg/h | INTRAVENOUS | Status: AC
  Administered 2017-12-24: 3 mL/kg/h via INTRAVENOUS

## 2017-12-24 SURGICAL SUPPLY — 22 items
CATH BALLN WEDGE 5F 110CM (CATHETERS) ×1 IMPLANT
CATH INFINITI 5 FR JL3.5 (CATHETERS) ×1 IMPLANT
CATH OPTICROSS 40MHZ (CATHETERS) ×2 IMPLANT
CATH OPTITORQUE TIG 4.0 5F (CATHETERS) ×1 IMPLANT
CATH SWAN GANZ 7F STRAIGHT (CATHETERS) ×1 IMPLANT
CATHETER LAUNCHER 6FR JR4 SH (CATHETERS) ×1 IMPLANT
DEVICE RAD COMP TR BAND LRG (VASCULAR PRODUCTS) ×1 IMPLANT
GLIDESHEATH SLEND A-KIT 6F 20G (SHEATH) ×1 IMPLANT
GUIDEWIRE .025 260CM (WIRE) ×1 IMPLANT
GUIDEWIRE INQWIRE 1.5J.035X260 (WIRE) IMPLANT
INQWIRE 1.5J .035X260CM (WIRE) ×2
KIT ESSENTIALS PG (KITS) ×1 IMPLANT
KIT HEART LEFT (KITS) ×2 IMPLANT
KIT MICROINTRODUCER 5F 7206 (SHEATH) ×1 IMPLANT
PACK CARDIAC CATHETERIZATION (CUSTOM PROCEDURE TRAY) ×2 IMPLANT
SHEATH GLIDE SLENDER 4/5FR (SHEATH) ×1 IMPLANT
SHEATH PINNACLE 7F 10CM (SHEATH) ×1 IMPLANT
SLED PULL BACK IVUS (MISCELLANEOUS) ×1 IMPLANT
TRANSDUCER W/STOPCOCK (MISCELLANEOUS) ×2 IMPLANT
TUBING CIL FLEX 10 FLL-RA (TUBING) ×2 IMPLANT
WIRE COUGAR XT STRL 190CM (WIRE) ×1 IMPLANT
WIRE EMERALD 3MM-J .025X260CM (WIRE) ×1 IMPLANT

## 2017-12-24 NOTE — Progress Notes (Signed)
Pt denies ever having any reaction to IV dye despite iodine and shell fish allergies. States that MD had stopped her Plavix due to excessive bruising. She has not taken it since 12/16/17.  Rennis Harding, RN from cath lab instructed me to not give her any at this time.

## 2017-12-24 NOTE — H&P (Signed)
OFFICE VISIT NOTES COPIED TO EPIC FOR DOCUMENTATION  . History of Present Illness Mallory Page MD; 12/16/2017 10:37 AM) Patient words: Last OV 06/03/2017; 6 month f/u for echo results & PAD.  The patient is a 84 year old female who presents for a Follow-up for Leg claudication.  Additional reasons for visit:  Follow-up for Aortic stenosis is described as the following: Mallory Thomas is a Caucasian female who is very active. Patient was seen 6 months ago and has severe aortic stenosis and was essentially asymptomatic. She underwent repeat echocardiogram and presents here for follow-up. Over the past few months she has noticed worsening shortness of breath and decreased exercise capacity and also has noticed exertional mild dizziness. No chest pain or palpitations.  With regard to symptoms of claudication, Minimal and she has not been limited by claudication in her legs. Her past medical history is significant for right iliac artery stenting in December 2017, hypertension, hyperlipidemia and controlled diabetes mellitus. Past surgical history is also significant for lumpectomy on the right for breast cancer in 2000 for. She is accompanied by her daughter at the bedside.   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 Moderate concentric hypertrophy of the left ventricle. Hyperdynamic LV systolic function. Doppler evidence of grade II (pseudonormal) diastolic dysfunction. Diastolic dysfunction findings suggests elevated LA/LV end diastolic pressure. Calculated EF 67%. Left atrial cavity appears to be moderately dilated in apical views. Mild aortic valve leaflet calcification.  Moderately restricted aortic valve leaflets. Severe aortic valve stenosis. Aortic valve peak pressure gradient of 74 and mean gradient of 47 mmHg, calculated aortic valve area 0.65 cm. Mild (Grade I) mitral regurgitation. Mild tricuspid regurgitation. No evidence of pulmonary hypertension. Compared to the study done on 05/26/2017, no significant change in the aortic valve severity stenosis. Claudication, intermittent (I73.9)  Abdominal aortic duplex 06/16/2017 Normal abdominal aorta. No evidence of aneurysm. Normal iliac artery velocity. ABI 11/12/2016: This exam reveals mildly decreased perfusion of the lower extremity, with bilateral ABI 0.90 noted at the post tibial artery level. Compared to 07/23/2016, right ABI is improved from 0.70. Lower extremity arterial duplex 07/23/2016: No hemodynamically significant stenoses are identified in the lower extremity arterial system. Moderately decreased right resting ABI. Resting ankle brachial index of the left lower extremity is within normal limits noted at the post tibial artery level. RABI 0.70 and LABI 1.00. Study suggests diffuse small vessel disease below knee. No significant change from LE arterial duplex 03/09/13: LABI 0.94 and RABI 0.91. Bilateral carotid bruits (R09.89)  Carotid duplex 05/10/2013: No evidence of hemodynamically significant stenosis in the bilateral carotid bifurcation vessels. There is evidence of heterogeneous plaque in the bilateral carotid artery. Mild bilateral intimal thickening. BMI 32.0-32.9,adult (Z68.32)  Labwork  10/22/2017: RBC 3.84, normal H&H, MCHC 31.7, CBC otherwise normal. Creatinine 1.01, EGFR 52, potassium 4.5, CMP otherwise normal. Hemoglobin A1c 7.4%. Cholesterol 233, HDL 75, LDL 146, triglycerides 58. TSH 3.9. Labs 11/05/2016: Serum glucose 223, BUN 26, creatinine 1.0, eGFR 53 mL, potassium 4.7. Hemoglobin 12.6/hematocrit 37.3, platelets 177, normal indicis. Pro time normal. 10/30/2016: Total cholesterol 251,  triglycerides 82, HDL 76, LDL 159, glucose 142, creatinine 0.96, potassium 5.1, CMP otherwise normal 09/23/2016: Serum glucose 138, BUN 22, serum creatinine 0.88, CMP normal. Potassium 5.2. A1c 6.6%. CBC  normal, total cholesterol 198, triglycerides 77, HDL 66, LDL 117. 02/27/2016: HB 2.3/HCT 39.4, platelets 156. Serum glucose 124, BUN 25, serum creatinine 0.96, eGFR 56 mL, CMP otherwise normal. A1c 6.7%. T. cholesterol 246, HDL 85, LDL 145, triglycerides 81.  Allergies Anderson Malta Sergeant; 01-01-18 8:51 AM) Shellfish  Hives. Seafood  Hives. Adhesive Bandages *MEDICAL DEVICES AND SUPPLIES*  Rash.  Family History Anderson Malta Sergeant; 01-Jan-2018 8:51 AM) Mother  Deceased. at age 73 from an MI Father  Deceased. at age 68 from an MI Siblings  1 sibling died from lung cancer  Social History Anderson Malta Sergeant; 01-Jan-2018 8:51 AM) Current tobacco use  Former smoker. quit smoking 84 years old Alcohol Use  Occasional alcohol use. rarely Marital status  Widowed. Living Situation  lives alone Number of Children  3.  Past Surgical History Anderson Malta Sergeant; 01-01-18 8:51 AM) Arm surgery [1986]: Lumpectomy [2004]: Right. Arthroscopic Knee Surgery - Both [2013]:  Medication History Anderson Malta Sergeant; 01-01-18 8:58 AM) Irbesartan ('300MG'$  Tablet, 1 Oral daily) Active. Aspirin ('81MG'$  Tablet, 1 Oral at bedtime) Active. Atorvastatin Calcium ('80MG'$  Tablet, 1 Oral daily) Active. Onglyza ('5MG'$  Tablet, 1 Oral daily) Active. Colestipol HCl (1GM Tablet, 1 Oral two times daily) Active. Clopidogrel Bisulfate ('75MG'$  Tablet, 1 Oral daily) Active. Medications Reconciled (pictures on phone, verbally)  Diagnostic Studies History Cheri Kearns; 01-01-18 8:14 AM) Echocardiogram [12/08/2017]: Left ventricle cavity is normal in size. Moderate concentric hypertrophy of the left ventricle. Hyperdynamic LV systolic function. Doppler evidence of grade II (pseudonormal) diastolic dysfunction. Diastolic  dysfunction findings suggests elevated LA/LV end diastolic pressure. Calculated EF 67%. Left atrial cavity appears to be moderately dilated in apical views. Mild aortic valve leaflet calcification. Moderately restricted aortic valve leaflets. Severe aortic valve stenosis. Aortic valve peak pressure gradient of 74 and mean gradient of 47 mmHg, calculated aortic valve area 0.65 cm. Mild (Grade I) mitral regurgitation. Mild tricuspid regurgitation. No evidence of pulmonary hypertension. Compared to the study done on 05/26/2017, no significant change in the aortic valve severity stenosis. Abdominal Ultrasound [06/16/2017]: Normal abdominal aorta. No evidence of aneurysm. Normal iliac artery velocity.    Review of Systems Mallory Page MD; January 01, 2018 10:37 AM) General Present- Feeling well. Not Present- Fatigue, Fever and Night Sweats. Skin Not Present- Itching and Rash. HEENT Not Present- Headache. Respiratory Not Present- Difficulty Breathing, Wakes up from Sleep Wheezing or Short of Breath and Wheezing. Cardiovascular Present- Difficulty Breathing On Exertion and Leg Cramps (improved since angioplasty of the right iliac artery, very mild symptoms). Not Present- Claudications, Edema, Fainting and Orthopnea. Gastrointestinal Not Present- Black, Tarry Stool, Constipation, Dysphagia, Nausea and Vomiting. Musculoskeletal Present- Joint Pain. Not Present- Claudication and Joint Swelling. Neurological Present- Dizziness (with exertion). Not Present- Headaches. Psychiatric Not Present- Anxiety and Insomnia. Endocrine Not Present- Excessive Sweating, Polydipsia and Polyuria. Hematology Not Present- Blood Clots, Easy Bruising and Nose Bleed.  Vitals Anderson Malta Sergeant; January 01, 2018 9:07 AM) 2018-01-01 8:52 AM Weight: 189 lb Height: 62.5in Body Surface Area: 1.88 m Body Mass Index: 34.02 kg/m  Pulse: 89 (Regular)  P.OX: 99% (Room air) BP: 151/91 (Sitting, Left Arm,  Standard)       Physical Exam Mallory Page, MD; 01-01-18 10:40 AM) General Mental Status-Alert. General Appearance-Cooperative, Appears stated age, Not in acute distress. Orientation-Oriented X3. Build & Nutrition-Well built and Mildly obese.  Head and Neck Thyroid Gland Characteristics - no palpable nodules, no palpable enlargement.  Chest and Lung Exam Chest and lung exam reveals -quiet, even and easy respiratory effort with no use of accessory muscles and  on auscultation, normal breath sounds, no adventitious sounds.  Cardiovascular Inspection Jugular vein - Right - No Distention. Auscultation Rhythm - Regular. Heart Sounds - S1 WNL, S2 Diminished intensity and No gallop present. Murmurs & Other Heart Sounds: Murmur - Location - Aortic Area. Timing - Early systolic. Grade - III/VI. Radiation - Carotids.  Abdomen Palpation/Percussion Normal exam - Non Tender and No hepatosplenomegaly. Auscultation Normal exam - Bowel sounds normal.  Peripheral Vascular Lower Extremity Inspection - Left - No Pigmentation, No Varicose veins. Right - No Pigmentation, No Varicose veins. Palpation - Edema - Bilateral - No edema. Femoral pulse - Bilateral - Feeble(Bilateral soft bruit present. Pulse difficult to feel due to patient's bodily habitus. Ecchymosis and small hematoma right groin noted.). Popliteal pulse - Bilateral - 1+. Dorsalis pedis pulse - Bilateral - 1+. Posterior tibial pulse - Bilateral - 1+. Carotid arteries - Bilateral-Harsh Bruit. Abdomen-No prominent abdominal aortic pulsation, No epigastric bruit.  Neurologic Motor-Grossly intact without any focal deficits.  Musculoskeletal - Did not examine.    Assessment & Plan Mallory Page MD; 12/16/2017 10:43 AM) Severe aortic stenosis (I35.0) Story: Echocardiogram 12/08/2017: Left ventricle cavity is normal in size. Moderate concentric hypertrophy of the left ventricle. Hyperdynamic LV systolic  function. Doppler evidence of grade II (pseudonormal) diastolic dysfunction. Diastolic dysfunction findings suggests elevated LA/LV end diastolic pressure. Calculated EF 67%. Left atrial cavity appears to be moderately dilated in apical views. Mild aortic valve leaflet calcification. Moderately restricted aortic valve leaflets. Severe aortic valve stenosis. Aortic valve peak pressure gradient of 74 and mean gradient of 47 mmHg, calculated aortic valve area 0.65 cm. Mild (Grade I) mitral regurgitation. Mild tricuspid regurgitation. No evidence of pulmonary hypertension. Compared to the study done on 05/26/2017, no significant change in the aortic valve severity stenosis. Impression: Treadmill exercise stress test 08/24/2016: Indications: Aortic Stenosis The resting electrocardiogram demonstrated normal sinus rhythm, no resting arrhythmias and normal rest repolarization. The stress electrocardiogram was normal. There were no significant arrhythmias. Patient exercised on Bruce protocol for 5:27 minutes and achieved 97% of Max Predicted HR (Target HR was >85% MPHR) and 7.05 METS. Stress symptoms included fatigue and dyspnea. Normal BP response. Exercise capacity was low . Impression: Normal stress EKG. No significant arrhythmias. Normal BP response. Current Plans PT (PROTHROMBIN TIME) (93267) Claudication, intermittent (I73.9) Story: Abdominal aortic duplex 06/16/2017 Normal abdominal aorta. No evidence of aneurysm. Normal iliac artery velocity.  ABI 11/12/2016: This exam reveals mildly decreased perfusion of the lower extremity, with bilateral ABI 0.90 noted at the post tibial artery level. Compared to 07/23/2016, right ABI is improved from 0.70.  Lower extremity arterial duplex 07/23/2016: No hemodynamically significant stenoses are identified in the lower extremity arterial system. Moderately decreased right resting ABI. Resting ankle brachial index of the left lower extremity is within normal  limits noted at the post tibial artery level. RABI 0.70 and LABI 1.00. Study suggests diffuse small vessel disease below knee. No significant change from LE arterial duplex 03/09/13: LABI 0.94 and RABI 0.91. Impression: Peripheral arteriogram 11/10/2016: Distal aorta diffuse 30% stenosis. Right renal superior pole 95% stenosis. Right CIA 80% to 0% with 10x30 mm self expanding Absolute Pro. Left mild disease. Current Plans Discontinued Clopidogrel Bisulfate '75MG'$  (completed course). Hypertension, benign (I10) Impression: EKG 12/16/2017: Normal sinus rhythm at rate of 85 bpm, normal axis. No evidence of ischemia, normal EKG. No significant change from EKG 06/03/2017. Current Plans Complete electrocardiogram (12458) METABOLIC PANEL, BASIC (09983) CBC & PLATELETS (AUTO) (38250) Hypercholesteremia (E78.00) Labwork Story: 10/22/2017: RBC  3.84, normal H&H, MCHC 31.7, CBC otherwise normal. Creatinine 1.01, EGFR 52, potassium 4.5, CMP otherwise normal. Hemoglobin A1c 7.4%. Cholesterol 233, HDL 75, LDL 146, triglycerides 58. NHDL 158. TSH 3.9.  Labs 11/05/2016: Serum glucose 223, BUN 26, creatinine 1.0, eGFR 53 mL, potassium 4.7. Hemoglobin 12.6/hematocrit 37.3, platelets 177, normal indicis. Pro time normal.  10/30/2016: Total cholesterol 251, triglycerides 82, HDL 76, LDL 159, glucose 142, creatinine 0.96, potassium 5.1, CMP otherwise normal  09/23/2016: Serum glucose 138, BUN 22, serum creatinine 0.88, CMP normal. Potassium 5.2. A1c 6.6%. CBC normal, total cholesterol 198, triglycerides 77, HDL 66, LDL 117.  02/27/2016: HB 2.3/HCT 39.4, platelets 156. Serum glucose 124, BUN 25, serum creatinine 0.96, eGFR 56 mL, CMP otherwise normal. A1c 6.7%. T. cholesterol 246, HDL 85, LDL 145, triglycerides 81.  Note:-  Recommendations:  Patient presents to me for follow-up of severe aortic stenosis, claudication, symptoms of claudication or remained stable. However she is now noticed worsening dyspnea on exertion  and has reduced her physical activity and is also noticed exertional dizziness. She now has symptomatic severe aortic stenosis. She will need left and right heart catheterization.  Blood pressure is elevated for the 1st time, patient has been extremely anxious as she was prepared to go for the aortic valve replacement as she knows that it is now becoming symptomatic as previously discussed 6 months ago.   In spite of patient being on Lipitor 80 mg, lipids are not well controlled. She may benefit from addition of Zetia atelectasis on her next office visit. Her daughter is present at the bedside and all questions regarding cardiac catheterization were discussed. I'll see her back after the procedure and make further recommendations. Schedule for cardiac catheterization, and possible angioplasty. We discussed regarding risks, benefits, alternatives to this including stress testing, CTA and continued medical therapy. Patient wants to proceed. Understands <1-2% risk of death, stroke, MI, urgent CABG, bleeding, infection, renal failure but not limited to these.  CC Dr. Jani Gravel.    Signed by Mallory Page, MD (12/16/2017 10:44 AM)

## 2017-12-24 NOTE — Interval H&P Note (Signed)
History and Physical Interval Note:  12/24/2017 8:00 AM  Mallory Thomas  has presented today for surgery, with the diagnosis of aortic stenosis  The various methods of treatment have been discussed with the patient and family. After consideration of risks, benefits and other options for treatment, the patient has consented to  Procedure(s): RIGHT/LEFT HEART CATH AND CORONARY ANGIOGRAPHY (N/A) as a surgical intervention .  The patient's history has been reviewed, patient examined, no change in status, stable for surgery.  I have reviewed the patient's chart and labs.  Questions were answered to the patient's satisfaction.     Adrian Prows

## 2017-12-24 NOTE — Progress Notes (Signed)
Site area: Right groin a 7 french venous sheath was removed  Site Prior to Removal:  Level 0  Pressure Applied For 20 MINUTES    Bedrest Beginning at 1200p  Manual:   Yes.    Patient Status During Pull:  stable  Post Pull Groin Site:  Level 0  Post Pull Instructions Given:  Yes.    Post Pull Pulses Present:  Yes.    Dressing Applied:  Yes.    Comments:  VS remain stable

## 2017-12-24 NOTE — Discharge Instructions (Signed)
Radial Site Care °Refer to this sheet in the next few weeks. These instructions provide you with information about caring for yourself after your procedure. Your health care provider may also give you more specific instructions. Your treatment has been planned according to current medical practices, but problems sometimes occur. Call your health care provider if you have any problems or questions after your procedure. °What can I expect after the procedure? °After your procedure, it is typical to have the following: °· Bruising at the radial site that usually fades within 1-2 weeks. °· Blood collecting in the tissue (hematoma) that may be painful to the touch. It should usually decrease in size and tenderness within 1-2 weeks. ° °Follow these instructions at home: °· Take medicines only as directed by your health care provider. °· You may shower 24-48 hours after the procedure or as directed by your health care provider. Remove the bandage (dressing) and gently wash the site with plain soap and water. Pat the area dry with a clean towel. Do not rub the site, because this may cause bleeding. °· Do not take baths, swim, or use a hot tub until your health care provider approves. °· Check your insertion site every day for redness, swelling, or drainage. °· Do not apply powder or lotion to the site. °· Do not flex or bend the affected arm for 24 hours or as directed by your health care provider. °· Do not push or pull heavy objects with the affected arm for 24 hours or as directed by your health care provider. °· Do not lift over 10 lb (4.5 kg) for 5 days after your procedure or as directed by your health care provider. °· Ask your health care provider when it is okay to: °? Return to work or school. °? Resume usual physical activities or sports. °? Resume sexual activity. °· Do not drive home if you are discharged the same day as the procedure. Have someone else drive you. °· You may drive 24 hours after the procedure  unless otherwise instructed by your health care provider. °· Do not operate machinery or power tools for 24 hours after the procedure. °· If your procedure was done as an outpatient procedure, which means that you went home the same day as your procedure, a responsible adult should be with you for the first 24 hours after you arrive home. °· Keep all follow-up visits as directed by your health care provider. This is important. °Contact a health care provider if: °· You have a fever. °· You have chills. °· You have increased bleeding from the radial site. Hold pressure on the site. °Get help right away if: °· You have unusual pain at the radial site. °· You have redness, warmth, or swelling at the radial site. °· You have drainage (other than a small amount of blood on the dressing) from the radial site. °· The radial site is bleeding, and the bleeding does not stop after 30 minutes of holding steady pressure on the site. °· Your arm or hand becomes pale, cool, tingly, or numb. °This information is not intended to replace advice given to you by your health care provider. Make sure you discuss any questions you have with your health care provider. °Document Released: 12/05/2010 Document Revised: 04/09/2016 Document Reviewed: 05/21/2014 °Elsevier Interactive Patient Education © 2018 [employer] °Femoral Site Care °Refer to this sheet in the next few weeks. These instructions provide you with information about caring for yourself after your procedure. Your   health care provider may also give you more specific instructions. Your treatment has been planned according to current medical practices, but problems sometimes occur. Call your health care provider if you have any problems or questions after your procedure. °What can I expect after the procedure? °After your procedure, it is typical to have the following: °· Bruising at the site that usually fades within 1-2 weeks. °· Blood collecting in the tissue (hematoma) that  may be painful to the touch. It should usually decrease in size and tenderness within 1-2 weeks. ° °Follow these instructions at home: °· Take medicines only as directed by your health care provider. °· You may shower 24-48 hours after the procedure or as directed by your health care provider. Remove the bandage (dressing) and gently wash the site with plain soap and water. Pat the area dry with a clean towel. Do not rub the site, because this may cause bleeding. °· Do not take baths, swim, or use a hot tub until your health care provider approves. °· Check your insertion site every day for redness, swelling, or drainage. °· Do not apply powder or lotion to the site. °· Limit use of stairs to twice a day for the first 2-3 days or as directed by your health care provider. °· Do not squat for the first 2-3 days or as directed by your health care provider. °· Do not lift over 10 lb (4.5 kg) for 5 days after your procedure or as directed by your health care provider. °· Ask your health care provider when it is okay to: °? Return to work or school. °? Resume usual physical activities or sports. °? Resume sexual activity. °· Do not drive home if you are discharged the same day as the procedure. Have someone else drive you. °· You may drive 24 hours after the procedure unless otherwise instructed by your health care provider. °· Do not operate machinery or power tools for 24 hours after the procedure or as directed by your health care provider. °· If your procedure was done as an outpatient procedure, which means that you went home the same day as your procedure, a responsible adult should be with you for the first 24 hours after you arrive home. °· Keep all follow-up visits as directed by your health care provider. This is important. °Contact a health care provider if: °· You have a fever. °· You have chills. °· You have increased bleeding from the site. Hold pressure on the site. °Get help right away if: °· You have  unusual pain at the site. °· You have redness, warmth, or swelling at the site. °· You have drainage (other than a small amount of blood on the dressing) from the site. °· The site is bleeding, and the bleeding does not stop after 30 minutes of holding steady pressure on the site. °· Your leg or foot becomes pale, cool, tingly, or numb. °This information is not intended to replace advice given to you by your health care provider. Make sure you discuss any questions you have with your health care provider. °Document Released: 07/06/2014 Document Revised: 04/09/2016 Document Reviewed: 05/22/2014 °Elsevier Interactive Patient Education © 2018 [employer] ° °

## 2017-12-24 NOTE — Progress Notes (Signed)
Site area: Right brachial a 5 french venous sheath was removed  Site Prior to Removal:  Level 0  Pressure Applied For 10 MINUTES    Bedrest Beginning at 1200p  Manual:   Yes.    Patient Status During Pull:  stable  Post Pull Groin Site:  Level 0  Post Pull Instructions Given:  Yes.    Post Pull Pulses Present:  No.  Dressing Applied:  Yes.    Comments:  VSD remain stable

## 2017-12-27 ENCOUNTER — Encounter (HOSPITAL_COMMUNITY): Payer: Self-pay | Admitting: Cardiology

## 2017-12-27 MED FILL — Heparin Sodium (Porcine) 2 Unit/ML in Sodium Chloride 0.9%: INTRAMUSCULAR | Qty: 1000 | Status: AC

## 2017-12-27 MED FILL — Lidocaine HCl Local Inj 1%: INTRAMUSCULAR | Qty: 20 | Status: AC

## 2017-12-27 MED FILL — Lidocaine Inj 1% w/ Epinephrine-1:100000: INTRAMUSCULAR | Qty: 20 | Status: AC

## 2017-12-31 DIAGNOSIS — I25118 Atherosclerotic heart disease of native coronary artery with other forms of angina pectoris: Secondary | ICD-10-CM | POA: Diagnosis not present

## 2017-12-31 DIAGNOSIS — R0989 Other specified symptoms and signs involving the circulatory and respiratory systems: Secondary | ICD-10-CM | POA: Diagnosis not present

## 2017-12-31 DIAGNOSIS — I739 Peripheral vascular disease, unspecified: Secondary | ICD-10-CM | POA: Diagnosis not present

## 2017-12-31 DIAGNOSIS — I350 Nonrheumatic aortic (valve) stenosis: Secondary | ICD-10-CM | POA: Diagnosis not present

## 2018-01-03 DIAGNOSIS — R0989 Other specified symptoms and signs involving the circulatory and respiratory systems: Secondary | ICD-10-CM | POA: Diagnosis not present

## 2018-01-04 ENCOUNTER — Other Ambulatory Visit: Payer: Self-pay

## 2018-01-04 DIAGNOSIS — I350 Nonrheumatic aortic (valve) stenosis: Secondary | ICD-10-CM

## 2018-01-06 ENCOUNTER — Ambulatory Visit (HOSPITAL_COMMUNITY): Payer: Medicare Other

## 2018-01-06 ENCOUNTER — Telehealth: Payer: Self-pay

## 2018-01-06 DIAGNOSIS — E7800 Pure hypercholesterolemia, unspecified: Secondary | ICD-10-CM | POA: Insufficient documentation

## 2018-01-06 DIAGNOSIS — E119 Type 2 diabetes mellitus without complications: Secondary | ICD-10-CM | POA: Insufficient documentation

## 2018-01-06 DIAGNOSIS — I10 Essential (primary) hypertension: Secondary | ICD-10-CM | POA: Insufficient documentation

## 2018-01-06 DIAGNOSIS — I25118 Atherosclerotic heart disease of native coronary artery with other forms of angina pectoris: Secondary | ICD-10-CM | POA: Insufficient documentation

## 2018-01-06 DIAGNOSIS — R0989 Other specified symptoms and signs involving the circulatory and respiratory systems: Secondary | ICD-10-CM | POA: Insufficient documentation

## 2018-01-06 NOTE — Telephone Encounter (Signed)
I have arranged pre TAVR Cardiac CT and CTA chest/abdomen/pelvis for the pt on 01/10/18.  I contacted the pt to review pre test instructions and discuss her allergies.  The pt states that she is allergic to all shellfish. The pt does not have a contrast allergy.  The pt had a cardiac catheterization performed on 12/24/17 and did not have a reaction to contrast. Per the pt she was not given any form of pre medication prior to cardiac cath.

## 2018-01-10 ENCOUNTER — Ambulatory Visit (HOSPITAL_COMMUNITY)
Admission: RE | Admit: 2018-01-10 | Discharge: 2018-01-10 | Disposition: A | Discharge status: Home / Self Care | Payer: Medicare Other | Source: Ambulatory Visit | Attending: Physician Assistant | Admitting: Physician Assistant

## 2018-01-10 ENCOUNTER — Encounter (HOSPITAL_COMMUNITY): Payer: Self-pay

## 2018-01-10 ENCOUNTER — Encounter (HOSPITAL_COMMUNITY): Payer: Self-pay | Admitting: Radiology

## 2018-01-10 DIAGNOSIS — Z01818 Encounter for other preprocedural examination: Secondary | ICD-10-CM | POA: Diagnosis not present

## 2018-01-10 DIAGNOSIS — K449 Diaphragmatic hernia without obstruction or gangrene: Secondary | ICD-10-CM | POA: Insufficient documentation

## 2018-01-10 DIAGNOSIS — I350 Nonrheumatic aortic (valve) stenosis: Secondary | ICD-10-CM

## 2018-01-10 DIAGNOSIS — K5730 Diverticulosis of large intestine without perforation or abscess without bleeding: Secondary | ICD-10-CM | POA: Insufficient documentation

## 2018-01-10 DIAGNOSIS — I700 Atherosclerosis of aorta: Secondary | ICD-10-CM | POA: Diagnosis not present

## 2018-01-10 DIAGNOSIS — I2510 Atherosclerotic heart disease of native coronary artery without angina pectoris: Secondary | ICD-10-CM | POA: Insufficient documentation

## 2018-01-10 LAB — POCT I-STAT CREATININE: Creatinine, Ser: 1 mg/dL (ref 0.44–1.00)

## 2018-01-10 MED ORDER — IOPAMIDOL (ISOVUE-370) INJECTION 76%
INTRAVENOUS | Status: AC
  Administered 2018-01-10: 100 mL
  Filled 2018-01-10: qty 100

## 2018-01-10 MED ORDER — METOPROLOL TARTRATE 5 MG/5ML IV SOLN
INTRAVENOUS | Status: AC
  Administered 2018-01-10: 5 mg via INTRAVENOUS
  Filled 2018-01-10: qty 20

## 2018-01-10 MED ORDER — METOPROLOL TARTRATE 5 MG/5ML IV SOLN
5.0000 mg | INTRAVENOUS | Status: DC | PRN
  Administered 2018-01-10 (×4): 5 mg via INTRAVENOUS
  Filled 2018-01-10 (×5): qty 5

## 2018-01-12 ENCOUNTER — Encounter: Payer: Medicare Other | Admitting: Surgery

## 2018-01-18 ENCOUNTER — Encounter: Payer: Self-pay | Admitting: Surgery

## 2018-01-18 ENCOUNTER — Institutional Professional Consult (permissible substitution): Payer: Medicare Other | Admitting: Surgery

## 2018-01-18 ENCOUNTER — Other Ambulatory Visit: Payer: Self-pay

## 2018-01-18 VITALS — BP 130/80 | HR 95 | Resp 18 | Ht 62.0 in | Wt 182.0 lb

## 2018-01-18 DIAGNOSIS — I350 Nonrheumatic aortic (valve) stenosis: Secondary | ICD-10-CM

## 2018-01-18 NOTE — Progress Notes (Signed)
Patient ID: Mallory Thomas, female   DOB: 1932/07/29, 84 y.o.   MRN: [id number]  [facility]ATION REPORT  Referring Provider is Adrian Prows, MD PCP is Jani Gravel, MD  Chief Complaint  Patient presents with  . Aortic Stenosis    1st TAVR evaluation    HPI:  The patient is an 84 year old woman with diabetes, hyperlipidemia, and hypertension as well as aortic stenosis that has been followed by Dr. Einar Gip.  She had a stress treadmill test in 08/2016 that was normal although she did develop fatigue and dyspnea.  She had an echocardiogram done in July 2018 that showed severe aortic stenosis but she was reportedly asymptomatic at that time.  A follow-up echocardiogram on 12/16/2017 was reportedly unchanged with a mean aortic valve gradient of 47 mmHg and a peak gradient of 74 mmHg.  The calculated aortic valve area was 0.65 cm.  The left ventricular ejection fraction was 67% with grade 2 diastolic dysfunction and moderate concentric left ventricular hypertrophy.  She was seen by Dr. Einar Gip and reported worsening shortness of breath and decreased exercise capacity as well as mild exertional dyspnea over the past few months.  She underwent cardiac catheterization by Dr. Einar Gip on 12/24/2017.  This showed an 85% calcified mid right coronary artery stenosis.  The ostium of the right coronary was also calcified and IVUS was performed showing the area of the ostium being 6 mm.  The luminal area in the area of mid right coronary stenosis was 2.48 mm.  The LAD and left circumflex had no significant stenosis.  Right heart pressures were normal with a cardiac index of 2.96.  She is here with HER-2 daughters today.  She is widowed and lives alone.  She has 3 adult children.  She says that she was going to water aerobics 3 days/week until Dr. Einar Gip told her not to do any exercise.  She minimizes her symptoms and says that she  feels fine but her daughters note that she does get short of breath with walking up steps and does not have the stamina that she had a year ago.  She denies any chest pain or pressure.  She has had occasional dizziness with activity that resolves quickly.  She has had no syncope.  She denies peripheral edema.  Past Medical History:  Diagnosis Date  . Breast cancer ([place]) 2004   right  . Diabetes mellitus   . Heart murmur   . Hyperlipidemia   . Hypertension     Past Surgical History:  Procedure Laterality Date  . BILATERAL KNEE ARTHROSCOPY    . BREAST LUMPECTOMY Right 2004   radiation   . INTRAVASCULAR ULTRASOUND/IVUS N/A 12/24/2017   Procedure: Intravascular Ultrasound/IVUS;  Surgeon: Adrian Prows, MD;  Location: [facility];  Service: Cardiovascular;  Laterality: N/A;  . lymph node removal right arm    . PERIPHERAL VASCULAR CATHETERIZATION N/A 11/10/2016   Procedure: Lower Extremity Angiography;  Surgeon: Adrian Prows, MD;  Location: [facility];  Service: Cardiovascular;  Laterality: N/A;  . PERIPHERAL VASCULAR CATHETERIZATION Right 11/10/2016   Procedure: Peripheral Vascular Intervention;  Surgeon: Adrian Prows, MD;  Location: [facility];  Service: Cardiovascular;  Laterality: Right;  Rt Com Iliac    History reviewed. No pertinent family history.  Social History   Socioeconomic History  . Marital status: Widowed    Spouse name: Not on file  . Number of children:  Not on file  . Years of education: Not on file  . Highest education level: Not on file  Social Needs  . Financial resource strain: Not on file  . Food insecurity - worry: Not on file  . Food insecurity - inability: Not on file  . Transportation needs - medical: Not on file  . Transportation needs - non-medical: Not on file  Occupational History  . Not on file  Tobacco Use  . Smoking status: Former Smoker    Last attempt to quit: 06/04/1968    Years since quitting: 49.6  . Smokeless tobacco: Never  Used  Substance and Sexual Activity  . Alcohol use: Not on file    Comment: occasional  . Drug use: No  . Sexual activity: Not on file  Other Topics Concern  . Not on file  Social History Narrative  . Not on file    Current Outpatient Medications  Medication Sig Dispense Refill  . aspirin 81 MG tablet Take 81 mg by mouth at bedtime.     Marland Kitchen atorvastatin (LIPITOR) 80 MG tablet Take 80 mg by mouth at bedtime.     . colestipol (COLESTID) 1 g tablet Take 1 g by mouth daily before lunch.     . irbesartan (AVAPRO) 300 MG tablet Take 300 mg by mouth daily.     . saxagliptin HCl (ONGLYZA) 5 MG TABS tablet Take 1 tablet (5 mg total) by mouth daily. 30 tablet    No current facility-administered medications for this visit.     Allergies  Allergen Reactions  . Fish Allergy Hives  . Iodine Other (See Comments)    Allergic to ALL seafood, shrimp, crab etc.  . Shellfish Allergy Hives  . Tape Hives  . Zetia [Ezetimibe] Diarrhea      Review of Systems:   General:  normal appetite, reduced energy, no weight gain, no weight loss, no fever  Cardiac:  no chest pain with exertion, no chest pain at rest, mild SOB with moderate exertion, no resting SOB, no PND, no orthopnea, no palpitations, no arrhythmia, no atrial fibrillation, no LE edema, occasional exertional dizzy spells, no syncope  Respiratory:  Mild exertional shortness of breath, no home oxygen, no productive cough, no dry cough, no bronchitis, no wheezing, no hemoptysis, no asthma, no pain with inspiration or cough, no sleep apnea, no CPAP at night  GI:   no difficulty swallowing, no reflux, no frequent heartburn, no hiatal hernia, no abdominal pain, no constipation, no diarrhea, no hematochezia, no hematemesis, no melena  GU:   no dysuria,  no frequency, no urinary tract infection, no hematuria,  no kidney stones, no kidney disease  Vascular:  no pain suggestive of claudication, no pain in feet, no leg cramps, no varicose veins, no DVT,  no non-healing foot ulcer  Neuro:   no stroke, no TIA's, no seizures, no headaches, no temporary blindness one eye,  no slurred speech, no peripheral neuropathy, no chronic pain, no instability of gait, no memory/cognitive dysfunction  Musculoskeletal: no arthritis, no joint swelling, no myalgias, no difficulty walking, normal mobility   Skin:   no rash, no itching, no skin infections, no pressure sores or ulcerations  Psych:   no anxiety, no depression, no nervousness, no unusual recent stress  Eyes:   no blurry vision, no floaters, no recent vision changes,  wears glasses or contacts  ENT:   no hearing loss, no loose or painful teeth, no dentures, last saw dentist last year  Hematologic:  +  easy bruising, no abnormal bleeding, no clotting disorder, no frequent epistaxis  Endocrine:  + diabetes, does check CBG's at home       Physical Exam:   BP 130/80 (BP Location: Left Arm, Patient Position: Sitting, Cuff Size: Large)   Pulse 95   Resp 18   Ht _0  (1.575 m)   Wt 182 lb (82.6 kg)   SpO2 96% Comment: RA  BMI 33.29 kg/m   General:  Elderly but  well-appearing  HEENT:  Unremarkable, NCAT, PERLA, EOMI, oropharynx clear  Neck:   no JVD, no bruits, no adenopathy or thyromegaly  Chest:   clear to auscultation, symmetrical breath sounds, no wheezes, no rhonchi   CV:   RRR, grade III/VI crescendo/decrescendo murmur heard best at RSB,  no diastolic murmur  Abdomen:  soft, non-tender, no masses or organomegaly  Extremities:  warm, well-perfused, pulses palpable in feet, no LE edema  Rectal/GU  Deferred  Neuro:   Grossly non-focal and symmetrical throughout  Skin:   Clean and dry, no rashes, no breakdown   Diagnostic Tests:  Echocardiogram 12/08/2017: Left ventricle cavity is normal in size. Moderate concentric hypertrophy of the left ventricle. Hyperdynamic LV systolic function. Doppler evidence of grade II (pseudonormal) diastolic dysfunction. Diastolic dysfunction findings suggests  elevated LA/LV end diastolic pressure. Calculated EF 67%. Left atrial cavity appears to be moderately dilated in apical views. Mild aortic valve leaflet calcification. Moderately restricted aortic valve leaflets. Severe aortic valve stenosis. Aortic valve peak pressure gradient of 74 and mean gradient of 47 mmHg, calculated aortic valve area 0.65 cm. Mild (Grade I) mitral regurgitation. Mild tricuspid regurgitation. No evidence of pulmonary hypertension. Compared to the study done on 05/26/2017, no significant change in the aortic valve severity stenosis.   Physicians   Panel Physicians Referring Physician Case Authorizing Physician  Adrian Prows, MD (Primary)    Procedures   Intravascular Ultrasound/IVUS  RIGHT HEART CATH AND CORONARY ANGIOGRAPHY  Conclusion   Coronary angiography 12/24/2017:  Mid RCA lesion is 85% stenosed. The lesion is type C. The lesion is calcified. Heavily calcified RCA stenosis. Ostium 2.61 x 2.95 mm area of 5.93 mm with circumferential calcification. Reference lumen 3.81 x 4.33 mm with area of 13.29 mm. Mid segment circumferential calcification with severe stenosis, 1.69 x 1.85 mm, area 2.48 mm. Unable to pass the catheter beyond this lesion, angiographically there appears to be calcified stenosis, probably high-grade.   There is heavy calcification evident across the aortic valve.  Left coronary arteries are widely patent however diffuse coronary calcification is evident both the LAD and circumflex.  Right heart catheterization: RA 7/4, mean 3 mmHg.  RV 39/1, EDP 8 mmHg. PA 44/18, mean 29 mmHg.  PA saturation 65%.  PW 21/20, mean 16 mmHg.  Cardiac output 5.53, cardiac index 2.96 by Fick.  Recommendation: Patient will need aortic valve replacement.  I discussed with surgical team regarding options for revascularization of right coronary artery.  She will be discharged home today with outpatient follow-up.  100 mL contrast utilized.  Indications   Shortness of  breath [R06.02 (ICD-10-CM)]  Aortic stenosis, severe [I35.0 (ICD-10-CM)]  Procedural Details/Technique   Technical Details Procedure performed: Right radial arterial access and right antecubital vein access for left and right heart catheterization, abandoned right antecubital axis as I was unable to torque the catheter due to right subclavian vein stenosis, right femoral vein access using micropuncture technique, IVUS RCA.  Indication: Patient with hypertension, hyperlipidemia, peripheral arterial disease with history of right common iliac  artery stenting with self-expanding stent in 2017, history of breast cancer status post radiation therapy to the chest, who has been having worsening shortness of breath and dyspnea on exertion. Outpatient echocardiogram on 12/08/2017 showed a aortic valve peak gradient of 74 and a mean gradient of 47 mmHg and aortic valve area of 0.65 cm. Hyperdynamic LVEF and moderate LVH. Given symptomatic aortic stenosis, dyspnea on exertion, she was brought to the cardiac catheterization lab to evaluate coronary anatomy and also right heart catheterization was performed to evaluate for pulmonary hypertension and to evaluate for cardiac output and cardiac index.  Technique: I utilized right antecubital vein access 5 [place], and advanced the 5 French balloon tip catheter with the help of a Glidewire to cross the subclavian vein stenosis which had performed angiogram on. I was able to get into the right ventricle however I was not able to manipulate the catheter. Hence that procedure was abandoned and I obtained a right femoral venous access and a 7 [place] sheath was introduced. I performed right heart catheterization without any complications. The catheters were then pulled out of the body in the usual fashion.  Right radial arterial access was obtained. A tiger 4.0 catheter was utilized to perform selective right coronary angiography and a JL 3.55 [place] diagnostic catheter was  utilized to engage the left main and angiography was performed.  Technique left IVUS: Using heparin for anticoagulation, keeping ACT greater than 200 seconds, advanced a cougar wire followed by IVUS catheter, Opticross and attempted to cross the mid RCA calcific stenosis but I was unable to. I did perform angiography and also performed IVUS interrogation of the mid segment and ostium of the right coronary artery which was significant stenosis and heavy circumferential calcification present. Significant EKG abnormalities were evident with engagement of the catheter to the right coronary artery.  The guidewire was withdrawn, IVUS catheter pulled out of the body. Angiography performed, no complications, guide cath were disengaged and pulled out of the body of the J-wire.   Estimated blood loss <50 mL.  During this procedure the patient was administered the following to achieve and maintain moderate conscious sedation: Versed 2 mg, Fentanyl 25 mcg, while the patient's heart rate, blood pressure, and oxygen saturation were continuously monitored. The period of conscious sedation was 88 minutes, of which I was present face-to-face 100% of this time.  Coronary Findings   Diagnostic  Dominance: Right  Left Anterior Descending  The vessel exhibits minimal luminal irregularities.  Left Circumflex  The vessel exhibits minimal luminal irregularities.  Right Coronary Artery  Ost RCA to Prox RCA lesion 80% stenosed  Ost RCA to Prox RCA lesion is 80% stenosed. The lesion is discrete. The lesion is calcified.  Mid RCA lesion 85% stenosed  Mid RCA lesion is 85% stenosed. The lesion is type C. The lesion is calcified. Heavily calcified RCA stenosis. Ostium 2.61 x 2.95 mm area of 5.93 mm with circumferential calcification. Reference lumen 3.81 x 4.33 mm with area of 13.29 mm. Mid segment circumferential calcification with severe stenosis, 1.69 x 1.85 mm, area 2.48 mm. Unable to pass the catheter beyond this  lesion, angiographically there appears to be calcified stenosis, probably high-grade.  Intervention   No interventions have been documented.  Right Heart   Right Heart Pressures Hemodynamic findings consistent with mild pulmonary hypertension. RA 7/4, mean 3 mmHg. RV 39/1, EDP 8 mmHg. PA 44/18, mean 29 mmHg. PA saturation 65%. PW 21/20, mean 16 mmHg. Cardiac output 5.53, cardiac index 2.96 by  Fick.  Coronary Diagrams   Diagnostic Diagram       Implants     No implant documentation for this case.  MERGE Images   Show images for CARDIAC CATHETERIZATION   Link to Procedure Log   Procedure Log    Hemo Data    Most Recent Value  Fick Cardiac Output 5.53 L/min  Fick Cardiac Output Index 2.96 (L/min)/BSA  RA A Wave 7 mmHg  RA V Wave 4 mmHg  RA Mean 3 mmHg  RV Systolic Pressure 39 mmHg  RV Diastolic Pressure 1 mmHg  RV EDP 8 mmHg  PA Systolic Pressure 44 mmHg  PA Diastolic Pressure 18 mmHg  PA Mean 29 mmHg  PW A Wave 21 mmHg  PW V Wave 20 mmHg  PW Mean 16 mmHg  AO Systolic Pressure 829 mmHg  AO Diastolic Pressure 68 mmHg  AO Mean 103 mmHg  QP/QS 1  TPVR Index 9.8 HRUI  TSVR Index 34.81 HRUI  PVR SVR Ratio 0.13  TPVR/TSVR Ratio 0.28   ADDENDUM REPORT: 01/11/2018 18:25  CLINICAL DATA:  84 year old female with severe aortic stenosis being evaluated for a TAVR procedure.  EXAM: Cardiac TAVR CT  TECHNIQUE: The patient was scanned on a [employer]. A 120 kV retrospective scan was triggered in the descending thoracic aorta at 111 HU's. Gantry rotation speed was 250 msecs and collimation was .6 mm. No beta blockade or nitro were given. The 3D data set was reconstructed in 5% intervals of the R-R cycle. Systolic and diastolic phases were analyzed on a dedicated work station using MPR, MIP and VRT modes. The patient received 80 cc of contrast.  FINDINGS: Aortic Valve: Trileaflet, severely thickened and calcified aortic valve with severely restricted  leaflet opening, no calcifications are extending into the LVOT.  Aorta: Normal size, moderate diffuse calcifications and atherosclerotic plaque.  Sinotubular Junction: 26 x 26 mm  Ascending Thoracic Aorta: 35 x 33 mm  Aortic Arch: 29 x 25 mm  Descending Thoracic Aorta: 23 x 22 mm  Sinus of Valsalva Measurements:  Non-coronary: 28 mm  Right -coronary: 29 mm  Left -coronary: 30 mm  Coronary Artery Height above Annulus:  Left Main: 13 mm  Right Coronary: 16 mm  Virtual Basal Annulus Measurements:  Maximum/Minimum Diameter: 24.4 x 22.4 mm  Mean Diameter: 22.9 mm  Area derived Diameter: 413 mm2  Perimeter: 73.2 mm  Optimum Fluoroscopic Angle for Delivery: LAO 4 CAU 3  IMPRESSION: 1. Trileaflet, severely thickened and calcified aortic valve with severely restricted leaflet opening, no calcifications are extending into the LVOT. Annular measurements suitable for delivery of a 23 mm Edwards-SAPIEN 3 valve.  2. Sufficient coronary to annulus distance.  3. Optimum Fluoroscopic Angle for Delivery: LAO 4 CAU 3  4. No thrombus in the left atrial appendage.   Electronically Signed   By: Ena Dawley   On: 01/11/2018 18:25   CLINICAL DATA:  84 year old female with history of severe aortic stenosis. Preprocedural study prior to potential transcatheter aortic valve replacement (TAVR) procedure.  EXAM: CT ANGIOGRAPHY CHEST, ABDOMEN AND PELVIS  TECHNIQUE: Multidetector CT imaging through the chest, abdomen and pelvis was performed using the standard protocol during bolus administration of intravenous contrast. Multiplanar reconstructed images and MIPs were obtained and reviewed to evaluate the vascular anatomy.  CONTRAST:  100 mL of Isovue 370.  COMPARISON:  None.  FINDINGS: CTA CHEST FINDINGS  Cardiovascular: Heart size is mildly enlarged with left atrial dilatation. There is no significant pericardial fluid, thickening  or pericardial calcification. There is aortic atherosclerosis, as well as atherosclerosis of the great vessels of the mediastinum and the coronary arteries, including calcified atherosclerotic plaque in the left main, left anterior descending, left circumflex and right coronary arteries. Severe thickening and calcification of the aortic valve.  Mediastinum/Lymph Nodes: No pathologically enlarged mediastinal or hilar lymph nodes. Large hiatal hernia. No axillary lymphadenopathy.  Lungs/Pleura: Cluster of peribronchovascular micronodularity in the periphery of the right upper lobe, nonspecific but statistically likely to reflect benign areas of mucoid impaction within the terminal bronchioles. No confluent consolidative airspace disease. No pleural effusions. Mosaic attenuation throughout the lungs bilaterally suggestive of a background of air trapping from small airways disease. Thin-walled cyst in the medial aspect of the right lower lobe incidentally noted.  Musculoskeletal/Soft Tissues: There are no aggressive appearing lytic or blastic lesions noted in the visualized portions of the skeleton.  CTA ABDOMEN AND PELVIS FINDINGS  Hepatobiliary: No suspicious cystic or solid hepatic lesions. No intra or extrahepatic biliary ductal dilatation. Gallbladder is normal in appearance.  Pancreas: No pancreatic mass. No pancreatic ductal dilatation. No pancreatic or peripancreatic fluid or inflammatory changes.  Spleen: Unremarkable.  Adrenals/Urinary Tract: Diffuse mild-to-moderate cortical thinning in both kidneys. No suspicious renal lesions. No hydroureteronephrosis. Urinary bladder is normal in appearance. Bilateral adrenal glands are normal in appearance.  Stomach/Bowel: Intra-abdominal portion of the stomach is unremarkable. No pathologic dilatation of small bowel or colon. Numerous colonic diverticulae are noted, without surrounding inflammatory changes to suggest an  acute diverticulitis at this time. Normal appendix.  Vascular/Lymphatic: Aortic atherosclerosis, without evidence of aneurysm or dissection in the abdominal or pelvic vasculature. Vascular findings and measurements pertinent to potential TAVR procedure, as detailed below. Celiac axis and inferior mesenteric artery are both widely patent without hemodynamically significant stenosis. Moderate stenosis of the ostium of the superior mesenteric artery. Two right and one left renal arteries are all widely patent without hemodynamically significant stenosis. No lymphadenopathy noted in the abdomen or pelvis.  Reproductive: Uterus and ovaries are unremarkable in appearance.  Other: No significant volume of ascites.  No pneumoperitoneum.  Musculoskeletal: There are no aggressive appearing lytic or blastic lesions noted in the visualized portions of the skeleton.  VASCULAR MEASUREMENTS PERTINENT TO TAVR:  AORTA:  Minimal Aortic Diameter-8 x 8 mm  Severity of Aortic Calcification-severe  RIGHT PELVIS:  Right Common Iliac Artery -  Minimal Diameter-7.0 x 6.6 mm  Tortuosity-mild  Calcification-moderate  Right External Iliac Artery -  Minimal Diameter-5.9 x 5.7 mm  Tortuosity-mild  Calcification-none  Right Common Femoral Artery -  Minimal Diameter-6.2 x 4.0 mm  Tortuosity-mild  Calcification-mild  LEFT PELVIS:  Left Common Iliac Artery -  Minimal Diameter-6.1 x 5.8 mm  Tortuosity-mild  Calcification-moderate  Left External Iliac Artery -  Minimal Diameter-5.1 x 5.3 mm  Tortuosity-mild  Calcification-none  Left Common Femoral Artery -  Minimal Diameter-6.3 x 5.3 mm  Tortuosity-mild  Calcification-mild  Review of the MIP images confirms the above findings.  IMPRESSION: 1. Vascular findings and measurements pertinent to potential TAVR procedure, as detailed above. 2. Severe thickening calcification of the aortic  valve, compatible with patient's reported clinical history of severe aortic stenosis. 3. Aortic atherosclerosis, in addition to left main and 3 vessel coronary artery disease. 4. Large hiatal hernia. This may have implications at the time of upcoming TAVR procedure. 5. Cardiomegaly with mild left atrial dilatation. 6. Mild colonic diverticulosis without evidence to suggest an acute diverticulitis at this time. 7. Additional incidental findings, as above.  Aortic Atherosclerosis (ICD10-I70.0).   Electronically Signed   By: Vinnie Langton M.D.   On: 01/11/2018 13:24  [facility] Adult Cardiac Surgery Database Version 2.9 RISK SCORES Procedure: AVR + CAB CALCULATE  Risk of Mortality:  3.521%   Renal Failure:  2.946%   Permanent Stroke:  2.722%   Prolonged Ventilation:  14.354%   DSW Infection:  0.280%   Reoperation:  4.999%   Morbidity or Mortality:  21.343%   Short Length of Stay:  21.473%   Long Length of Stay:  9.350%    Impression:  This 84 year old woman has stage D, severe, symptomatic aortic stenosis with New York Heart Association class II symptoms of exertional shortness of breath and fatigue consistent with chronic diastolic heart failure as well as occasional episodes of exertional dizziness.  She says that she feels okay and can do what she wants but her two daughters feel like she is minimizing her symptoms and is not as active as she was a year ago.  I do not have a copy of her echocardiogram yet but reportedly shows a calcified aortic valve with a mean gradient of 47 mmHg consistent with severe aortic stenosis.  Her cardiac catheterization shows a calcified 85% mid right coronary stenosis and a large dominant vessel with no disease in the LAD or left circumflex systems.  She is not having any anginal symptoms and I think this could be treated medically at this point.  I think aortic valve replacement is indicated in this patient was severe aortic stenosis and mild symptoms  to prevent progressive left ventricular deterioration and allow her to return to her active lifestyle.  I think she would be at moderate risk for open surgical aortic valve replacement and coronary artery bypass graft surgery to the right coronary artery.  I think transcatheter aortic valve replacement would be a reasonable alternative for this 84 year old woman.  Her gated cardiac CTA shows anatomy suitable for transcatheter aortic valve replacement without any complicating features.  Her abdominal and pelvic CTA shows diffuse aortoiliac disease and she does have a right iliac stent but I think she may have adequate pelvic arterial access to allow transfemoral insertion. She does have significant calcific plaque at the take off of the left subclavian artery and tortuous carotid arteries that are on the small side.  The patient and her daughters were counseled at length regarding treatment alternatives for management of severe symptomatic aortic stenosis. The risks and benefits of surgical intervention has been discussed in detail. Long-term prognosis with medical therapy was discussed. Alternative approaches such as conventional surgical aortic valve replacement, transcatheter aortic valve replacement, and palliative medical therapy were compared and contrasted at length. This discussion was placed in the context of the patient's own specific clinical presentation and past medical history. All of their questions been addressed.   I discussed a variety of complications that might develop including but not limited to risks of death, stroke, paravalvular leak, aortic dissection or other major vascular complications, aortic annulus rupture, device embolization, cardiac rupture or perforation, mitral regurgitation, acute myocardial infarction, arrhythmia, heart block or bradycardia requiring permanent pacemaker placement, congestive heart failure, respiratory failure, renal failure, pneumonia, infection, other late  complications related to structural valve deterioration or migration, or other complications that might ultimately cause a temporary or permanent loss of functional independence or other long term morbidity.     Plan:  The patient will return for a second surgical evaluation by Dr. Roxy Manns and then a decision will be made  about proceeding with TAVR.   I spent 60 minutes performing this consultation and > 50% of this time was spent face to face counseling and coordinating the care of this patient's severe symptomatic aortic stenosis.    Gaye Pollack, MD 01/18/2018

## 2018-01-19 ENCOUNTER — Other Ambulatory Visit: Payer: Self-pay

## 2018-01-19 DIAGNOSIS — E119 Type 2 diabetes mellitus without complications: Secondary | ICD-10-CM | POA: Diagnosis not present

## 2018-01-19 DIAGNOSIS — I350 Nonrheumatic aortic (valve) stenosis: Secondary | ICD-10-CM

## 2018-01-26 DIAGNOSIS — E7800 Pure hypercholesterolemia, unspecified: Secondary | ICD-10-CM | POA: Diagnosis not present

## 2018-01-26 DIAGNOSIS — E119 Type 2 diabetes mellitus without complications: Secondary | ICD-10-CM | POA: Diagnosis not present

## 2018-01-26 DIAGNOSIS — I10 Essential (primary) hypertension: Secondary | ICD-10-CM | POA: Diagnosis not present

## 2018-01-31 ENCOUNTER — Encounter: Payer: Self-pay | Admitting: Cardiovascular Disease

## 2018-01-31 ENCOUNTER — Ambulatory Visit: Payer: Medicare Other | Admitting: Cardiovascular Disease

## 2018-01-31 VITALS — BP 166/84 | HR 96 | Ht 62.0 in | Wt 186.0 lb

## 2018-01-31 DIAGNOSIS — I350 Nonrheumatic aortic (valve) stenosis: Secondary | ICD-10-CM | POA: Diagnosis not present

## 2018-01-31 NOTE — Progress Notes (Signed)
Cardiology Office Note Date:  01/31/2018   ID:  Mallory Thomas, DOB 06-22-1932, MRN [id number]  PCP:  Mallory Gravel, MD  Cardiologist:  Mallory Prows, MD  Chief Complaint  Patient presents with  . Aortic Stenosis   History of Present Illness: Mallory Thomas is a 84 y.o. female who presents for further evaluation of severe aortic stenosis, referred by Dr. Einar Gip.  The patient has been followed for hypertension, diabetes, and peripheral arterial disease.  She has not had symptoms of angina, lightheadedness, or syncope.  With activity she is primarily limited by bilateral buttock pain.  She does have mild shortness of breath with walking uphill for longer distances.  She has no symptoms with any of her normal activities.  An echocardiogram from January 2019 demonstrated normal LV size and systolic function with moderate LVH, calculated LVEF of 67%, moderately dilated left atrium, severe aortic stenosis with a peak gradient of 74 mmHg and mean gradient of 47 mmHg with calculated aortic valve area of 0.65 cm.  There is mild tricuspid regurgitation and no evidence of pulmonary hypertension.  Cardiac catheterization December 24, 2017 demonstrated 85% calcific stenosis of the mid right coronary artery.  At the lesion site the minimal lumen area is 2.5 mm.  The left main, LAD, and left circumflex are widely patent without significant stenosis.  Right heart catheterization is notable for mild pulmonary hypertension with a mean PA pressure of 29 mmHg and preserved cardiac output of 5.5 L/min.  The patient is here alone today.  She is remarkably active, participating in many specific activities.  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  She and her husband moved to [place] with [employer] truck many years ago.  She has been widowed for greater than 20 years.  Her 3 grown children live here locally.    The patient underwent right iliac  stenting in 2017.  Other than buttock pain with ambulation, she does not have much limitation.  She has no significant problems with arthritis.  She reports no issues with gait unsteadiness.  She admits to mild shortness of breath with activity but denies chest pain, chest pressure, or heart palpitations.  Past Medical History:  Diagnosis Date  . Breast cancer ([place]) 2004   right  . Diabetes mellitus   . Heart murmur   . Hyperlipidemia   . Hypertension     Past Surgical History:  Procedure Laterality Date  . BILATERAL KNEE ARTHROSCOPY    . BREAST LUMPECTOMY Right 2004   radiation   . INTRAVASCULAR ULTRASOUND/IVUS N/A 12/24/2017   Procedure: Intravascular Ultrasound/IVUS;  Surgeon: Mallory Prows, MD;  Location: [facility];  Service: Cardiovascular;  Laterality: N/A;  . lymph node removal right arm    . PERIPHERAL VASCULAR CATHETERIZATION N/A 11/10/2016   Procedure: Lower Extremity Angiography;  Surgeon: Mallory Prows, MD;  Location: [facility];  Service: Cardiovascular;  Laterality: N/A;  . PERIPHERAL VASCULAR CATHETERIZATION Right 11/10/2016   Procedure: Peripheral Vascular Intervention;  Surgeon: Mallory Prows, MD;  Location: [facility];  Service: Cardiovascular;  Laterality: Right;  Rt Com Iliac    Current Outpatient Medications  Medication Sig Dispense Refill  . aspirin 81 MG tablet Take 81 mg by mouth at bedtime.     Marland Kitchen atorvastatin (LIPITOR) 80 MG tablet Take 80 mg by mouth at bedtime.     . colestipol (COLESTID) 1 g tablet Take 1  g by mouth daily before lunch.     . irbesartan (AVAPRO) 300 MG tablet Take 300 mg by mouth daily.     . saxagliptin HCl (ONGLYZA) 5 MG TABS tablet Take 1 tablet (5 mg total) by mouth daily. 30 tablet    No current facility-administered medications for this visit.     Allergies:   Fish allergy; Iodine; Shellfish allergy; Tape; and Zetia [ezetimibe]   Social History:  The patient  reports that she quit smoking about 49 years ago. she has  never used smokeless tobacco. She reports that she does not use drugs.   Family History:  The patient's family history is not on file.   ROS:  Please see the history of present illness.  All other systems are reviewed and negative.   PHYSICAL EXAM: VS:  BP (!) 166/84   Pulse 96   Ht 5\' 2"  (1.575 m)   Wt 186 lb (84.4 kg)   SpO2 98%   BMI 34.02 kg/m  , BMI Body mass index is 34.02 kg/m. GEN: Well nourished, well developed, in no acute distress  HEENT: normal  Neck: no JVD, no masses. BL carotid bruits Cardiac: RRR with grade 3/6 harsh late peaking systolic murmur at the right upper sternal border, no diastolic murmur, absent A2                Respiratory:  clear to auscultation bilaterally, normal work of breathing GI: soft, nontender, nondistended, + BS MS: no deformity or atrophy  Ext: no pretibial edema Skin: warm and dry, no rash Neuro:  Strength and sensation are intact Psych: euthymic mood, full affect  EKG:  EKG is not ordered today.  Recent Labs: 06/24/2017: ALT 7; BUN 25.6; HGB 12.0; Platelets 154; Potassium 4.8; Sodium 141 01/10/2018: Creatinine, Ser 1.00   Lipid Panel     Component Value Date/Time   CHOL 238 (H) 05/14/2009 0915   TRIG 63 05/14/2009 0915   HDL 73 05/14/2009 0915   CHOLHDL 3.3 05/14/2009 0915   VLDL 13 05/14/2009 0915   LDLCALC 152 (H) 05/14/2009 0915      Wt Readings from Last 3 Encounters:  01/31/18 186 lb (84.4 kg)  01/18/18 182 lb (82.6 kg)  12/24/17 185 lb (83.9 kg)     Cardiac Studies Reviewed: Cardiac catheterization 12/24/2017: Conclusion   Coronary angiography 12/24/2017:  Mid RCA lesion is 85% stenosed. The lesion is type C. The lesion is calcified. Heavily calcified RCA stenosis. Ostium 2.61 x 2.95 mm area of 5.93 mm with circumferential calcification. Reference lumen 3.81 x 4.33 mm with area of 13.29 mm. Mid segment circumferential calcification with severe stenosis, 1.69 x 1.85 mm, area 2.48 mm. Unable to pass the catheter  beyond this lesion, angiographically there appears to be calcified stenosis, probably high-grade.   There is heavy calcification evident across the aortic valve.  Left coronary arteries are widely patent however diffuse coronary calcification is evident both the LAD and circumflex.  Right heart catheterization: RA 7/4, mean 3 mmHg.  RV 39/1, EDP 8 mmHg. PA 44/18, mean 29 mmHg.  PA saturation 65%.  PW 21/20, mean 16 mmHg.  Cardiac output 5.53, cardiac index 2.96 by Fick.  Recommendation: Patient will need aortic valve replacement.  I discussed with surgical team regarding options for revascularization of right coronary artery.  She will be discharged home today with outpatient follow-up.  100 mL contrast utilized.   ASSESSMENT AND PLAN: This is an 84 year old woman with severe, stage D1 aortic stenosis.  Echo data is reviewed with a mean gradient of 47 mmHg and physical exam is certainly consistent with severe aortic stenosis.  The patient's symptoms with exertion are certainly mild, but she strongly favors moving forward with treatment.  I have reviewed the natural history of aortic stenosis with the patient today. We have discussed the limitations of medical therapy and the poor prognosis associated with symptomatic aortic stenosis. We have reviewed potential treatment options, including palliative medical therapy, conventional surgical aortic valve replacement, and transcatheter aortic valve replacement. We discussed treatment options in the context of the patient's specific comorbid medical conditions.   I have personally reviewed the patient's cardiac catheterization, peripheral angiogram, and CTA studies.  I have reviewed the data from her echo study but we will request that a disc be made so the images can be reviewed.  TAVR seems like a reasonable treatment option in this 84 year old woman with diabetes and peripheral arterial disease with an STS-PROM of 3.5%.  The patient does have severe  calcification with associated stenosis of the distal abdominal aorta.  She also has diseased iliac arteries with a patent stent in the right common iliac artery.  I have reviewed the CTA study and made measurements.  I think her iliofemoral system is adequate for a 41 French E sheath which would accommodate the appropriate transcatheter heart valve.  Will also review her studies with the multidisciplinary team.  Alternative approaches were discussed with the patient today.    Following the decision to proceed with transcatheter aortic valve replacement, a discussion has been held regarding what types of management strategies would be attempted intraoperatively in the event of life-threatening complications, including whether or not the patient would be considered a candidate for the use of cardiopulmonary bypass and/or conversion to open sternotomy for attempted surgical intervention.  The patient has been advised of a variety of complications that might develop including but not limited to risks of death, stroke, paravalvular leak, aortic dissection or other major vascular complications, aortic annulus rupture, device embolization, cardiac rupture or perforation, mitral regurgitation, acute myocardial infarction, arrhythmia, heart block or bradycardia requiring permanent pacemaker placement, congestive heart failure, respiratory failure, renal failure, pneumonia, infection, other late complications related to structural valve deterioration or migration, or other complications that might ultimately cause a temporary or permanent loss of functional independence or other long term morbidity.  The patient provides full informed consent for the procedure as described and all questions were answered.  The patient has an upcoming second surgical consultation with Dr. Roxy Manns.  She will be scheduled for TAVR after that consultation is completed as long as the valve team is in agreement.  Current medicines are reviewed  with the patient today.  The patient does not have concerns regarding medicines.  Labs/ tests ordered today include:  No orders of the defined types were placed in this encounter.   Signed, Sherren Mocha, MD  01/31/2018 8:59 AM    [facility] [address], [place], [place]  [address] Phone: [phone]; Fax: [phone]

## 2018-01-31 NOTE — Patient Instructions (Signed)
Medication Instructions:  Your provider recommends that you continue on your current medications as directed. Please refer to the Current Medication list given to you today.    Labwork: None  Testing/Procedures: None  Follow-Up: Please keep all your upcoming appointments!   Any Other Special Instructions Will Be Listed Below (If Applicable).     If you need a refill on your cardiac medications before your next appointment, please call your pharmacy.

## 2018-01-31 NOTE — H&P (View-Only) (Signed)
Cardiology Office Note Date:  01/31/2018   ID:  Reda Citron, DOB 08/30/1932, MRN [id number]  PCP:  Jani Gravel, MD  Cardiologist:  Adrian Prows, MD  Chief Complaint  Patient presents with  . Aortic Stenosis   History of Present Illness: Mallory Thomas is a 84 y.o. female who presents for further evaluation of severe aortic stenosis, referred by Dr. Einar Gip.  The patient has been followed for hypertension, diabetes, and peripheral arterial disease.  She has not had symptoms of angina, lightheadedness, or syncope.  With activity she is primarily limited by bilateral buttock pain.  She does have mild shortness of breath with walking uphill for longer distances.  She has no symptoms with any of her normal activities.  An echocardiogram from January 2019 demonstrated normal LV size and systolic function with moderate LVH, calculated LVEF of 67%, moderately dilated left atrium, severe aortic stenosis with a peak gradient of 74 mmHg and mean gradient of 47 mmHg with calculated aortic valve area of 0.65 cm.  There is mild tricuspid regurgitation and no evidence of pulmonary hypertension.  Cardiac catheterization December 24, 2017 demonstrated 85% calcific stenosis of the mid right coronary artery.  At the lesion site the minimal lumen area is 2.5 mm.  The left main, LAD, and left circumflex are widely patent without significant stenosis.  Right heart catheterization is notable for mild pulmonary hypertension with a mean PA pressure of 29 mmHg and preserved cardiac output of 5.5 L/min.  The patient is here alone today.  She is remarkably active, participating in many specific activities.  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 with [employer] truck many years ago.  She has been widowed for greater than 20 years.  Her 3 grown children live here locally.    The patient underwent right iliac  stenting in 2017.  Other than buttock pain with ambulation, she does not have much limitation.  She has no significant problems with arthritis.  She reports no issues with gait unsteadiness.  She admits to mild shortness of breath with activity but denies chest pain, chest pressure, or heart palpitations.  Past Medical History:  Diagnosis Date  . Breast cancer ([facility]) 2004   right  . Diabetes mellitus   . Heart murmur   . Hyperlipidemia   . Hypertension     Past Surgical History:  Procedure Laterality Date  . BILATERAL KNEE ARTHROSCOPY    . BREAST LUMPECTOMY Right 2004   radiation   . INTRAVASCULAR ULTRASOUND/IVUS N/A 12/24/2017   Procedure: Intravascular Ultrasound/IVUS;  Surgeon: Adrian Prows, MD;  Location: [facility];  Service: Cardiovascular;  Laterality: N/A;  . lymph node removal right arm    . PERIPHERAL VASCULAR CATHETERIZATION N/A 11/10/2016   Procedure: Lower Extremity Angiography;  Surgeon: Adrian Prows, MD;  Location: [facility];  Service: Cardiovascular;  Laterality: N/A;  . PERIPHERAL VASCULAR CATHETERIZATION Right 11/10/2016   Procedure: Peripheral Vascular Intervention;  Surgeon: Adrian Prows, MD;  Location: [facility];  Service: Cardiovascular;  Laterality: Right;  Rt Com Iliac    Current Outpatient Medications  Medication Sig Dispense Refill  . aspirin 81 MG tablet Take 81 mg by mouth at bedtime.     Marland Kitchen atorvastatin (LIPITOR) 80 MG tablet Take 80 mg by mouth at bedtime.     . colestipol (COLESTID) 1 g tablet Take 1  g by mouth daily before lunch.     . irbesartan (AVAPRO) 300 MG tablet Take 300 mg by mouth daily.     . saxagliptin HCl (ONGLYZA) 5 MG TABS tablet Take 1 tablet (5 mg total) by mouth daily. 30 tablet    No current facility-administered medications for this visit.     Allergies:   Fish allergy; Iodine; Shellfish allergy; Tape; and Zetia [ezetimibe]   Social History:  The patient  reports that she quit smoking about 49 years ago. she has  never used smokeless tobacco. She reports that she does not use drugs.   Family History:  The patient's family history is not on file.   ROS:  Please see the history of present illness.  All other systems are reviewed and negative.   PHYSICAL EXAM: VS:  BP (!) 166/84   Pulse 96   Ht 5\' 2"  (1.575 m)   Wt 186 lb (84.4 kg)   SpO2 98%   BMI 34.02 kg/m  , BMI Body mass index is 34.02 kg/m. GEN: Well nourished, well developed, in no acute distress  HEENT: normal  Neck: no JVD, no masses. BL carotid bruits Cardiac: RRR with grade 3/6 harsh late peaking systolic murmur at the right upper sternal border, no diastolic murmur, absent A2                Respiratory:  clear to auscultation bilaterally, normal work of breathing GI: soft, nontender, nondistended, + BS MS: no deformity or atrophy  Ext: no pretibial edema Skin: warm and dry, no rash Neuro:  Strength and sensation are intact Psych: euthymic mood, full affect  EKG:  EKG is not ordered today.  Recent Labs: 06/24/2017: ALT 7; BUN 25.6; HGB 12.0; Platelets 154; Potassium 4.8; Sodium 141 01/10/2018: Creatinine, Ser 1.00   Lipid Panel     Component Value Date/Time   CHOL 238 (H) 05/14/2009 0915   TRIG 63 05/14/2009 0915   HDL 73 05/14/2009 0915   CHOLHDL 3.3 05/14/2009 0915   VLDL 13 05/14/2009 0915   LDLCALC 152 (H) 05/14/2009 0915      Wt Readings from Last 3 Encounters:  01/31/18 186 lb (84.4 kg)  01/18/18 182 lb (82.6 kg)  12/24/17 185 lb (83.9 kg)     Cardiac Studies Reviewed: Cardiac catheterization 12/24/2017: Conclusion   Coronary angiography 12/24/2017:  Mid RCA lesion is 85% stenosed. The lesion is type C. The lesion is calcified. Heavily calcified RCA stenosis. Ostium 2.61 x 2.95 mm area of 5.93 mm with circumferential calcification. Reference lumen 3.81 x 4.33 mm with area of 13.29 mm. Mid segment circumferential calcification with severe stenosis, 1.69 x 1.85 mm, area 2.48 mm. Unable to pass the catheter  beyond this lesion, angiographically there appears to be calcified stenosis, probably high-grade.   There is heavy calcification evident across the aortic valve.  Left coronary arteries are widely patent however diffuse coronary calcification is evident both the LAD and circumflex.  Right heart catheterization: RA 7/4, mean 3 mmHg.  RV 39/1, EDP 8 mmHg. PA 44/18, mean 29 mmHg.  PA saturation 65%.  PW 21/20, mean 16 mmHg.  Cardiac output 5.53, cardiac index 2.96 by Fick.  Recommendation: Patient will need aortic valve replacement.  I discussed with surgical team regarding options for revascularization of right coronary artery.  She will be discharged home today with outpatient follow-up.  100 mL contrast utilized.   ASSESSMENT AND PLAN: This is an 84 year old woman with severe, stage D1 aortic stenosis.  Echo data is reviewed with a mean gradient of 47 mmHg and physical exam is certainly consistent with severe aortic stenosis.  The patient's symptoms with exertion are certainly mild, but she strongly favors moving forward with treatment.  I have reviewed the natural history of aortic stenosis with the patient today. We have discussed the limitations of medical therapy and the poor prognosis associated with symptomatic aortic stenosis. We have reviewed potential treatment options, including palliative medical therapy, conventional surgical aortic valve replacement, and transcatheter aortic valve replacement. We discussed treatment options in the context of the patient's specific comorbid medical conditions.   I have personally reviewed the patient's cardiac catheterization, peripheral angiogram, and CTA studies.  I have reviewed the data from her echo study but we will request that a disc be made so the images can be reviewed.  TAVR seems like a reasonable treatment option in this 84 year old woman with diabetes and peripheral arterial disease with an STS-PROM of 3.5%.  The patient does have severe  calcification with associated stenosis of the distal abdominal aorta.  She also has diseased iliac arteries with a patent stent in the right common iliac artery.  I have reviewed the CTA study and made measurements.  I think her iliofemoral system is adequate for a 28 French E sheath which would accommodate the appropriate transcatheter heart valve.  Will also review her studies with the multidisciplinary team.  Alternative approaches were discussed with the patient today.    Following the decision to proceed with transcatheter aortic valve replacement, a discussion has been held regarding what types of management strategies would be attempted intraoperatively in the event of life-threatening complications, including whether or not the patient would be considered a candidate for the use of cardiopulmonary bypass and/or conversion to open sternotomy for attempted surgical intervention.  The patient has been advised of a variety of complications that might develop including but not limited to risks of death, stroke, paravalvular leak, aortic dissection or other major vascular complications, aortic annulus rupture, device embolization, cardiac rupture or perforation, mitral regurgitation, acute myocardial infarction, arrhythmia, heart block or bradycardia requiring permanent pacemaker placement, congestive heart failure, respiratory failure, renal failure, pneumonia, infection, other late complications related to structural valve deterioration or migration, or other complications that might ultimately cause a temporary or permanent loss of functional independence or other long term morbidity.  The patient provides full informed consent for the procedure as described and all questions were answered.  The patient has an upcoming second surgical consultation with Dr. Roxy Manns.  She will be scheduled for TAVR after that consultation is completed as long as the valve team is in agreement.  Current medicines are reviewed  with the patient today.  The patient does not have concerns regarding medicines.  Labs/ tests ordered today include:  No orders of the defined types were placed in this encounter.   Signed, Mallory Mocha, MD  01/31/2018 8:59 AM    [facility] [place], [place], [place]  [address] Phone: [phone]; Fax: [phone]

## 2018-02-09 ENCOUNTER — Other Ambulatory Visit: Payer: Self-pay

## 2018-02-09 ENCOUNTER — Ambulatory Visit: Payer: Medicare Other | Attending: Surgery | Admitting: Physical Therapy

## 2018-02-09 ENCOUNTER — Encounter: Payer: Self-pay | Admitting: Thoracic Surgery (Cardiothoracic Vascular Surgery)

## 2018-02-09 ENCOUNTER — Ambulatory Visit (HOSPITAL_COMMUNITY)
Admission: RE | Admit: 2018-02-09 | Discharge: 2018-02-09 | Disposition: A | Discharge status: Home / Self Care | Payer: Medicare Other | Source: Ambulatory Visit | Attending: Physician Assistant | Admitting: Physician Assistant

## 2018-02-09 ENCOUNTER — Institutional Professional Consult (permissible substitution): Payer: Medicare Other | Admitting: Thoracic Surgery (Cardiothoracic Vascular Surgery)

## 2018-02-09 ENCOUNTER — Encounter: Payer: Self-pay | Admitting: Physical Therapy

## 2018-02-09 VITALS — BP 116/67 | HR 100 | Resp 20 | Ht 62.0 in | Wt 185.0 lb

## 2018-02-09 DIAGNOSIS — I350 Nonrheumatic aortic (valve) stenosis: Secondary | ICD-10-CM

## 2018-02-09 DIAGNOSIS — R262 Difficulty in walking, not elsewhere classified: Secondary | ICD-10-CM | POA: Diagnosis not present

## 2018-02-09 LAB — PULMONARY FUNCTION TEST
DL/VA % pred: 93 %
DL/VA: 4.23 ml/min/mmHg/L
DLCO unc % pred: 78 %
DLCO unc: 16.99 ml/min/mmHg
FEF 25-75 Post: 1.29 L/sec
FEF 25-75 Pre: 1.67 L/sec
FEF2575-%Change-Post: -22 %
FEF2575-%Pred-Post: 115 %
FEF2575-%Pred-Pre: 149 %
FEV1-%Change-Post: -5 %
FEV1-%Pred-Post: 106 %
FEV1-%Pred-Pre: 112 %
FEV1-Post: 1.74 L
FEV1-Pre: 1.84 L
FEV1FVC-%Change-Post: 0 %
FEV1FVC-%Pred-Pre: 108 %
FEV6-%Change-Post: -6 %
FEV6-%Pred-Post: 104 %
FEV6-%Pred-Pre: 110 %
FEV6-Post: 2.17 L
FEV6-Pre: 2.32 L
FEV6FVC-%Pred-Post: 106 %
FEV6FVC-%Pred-Pre: 106 %
FVC-%Change-Post: -6 %
FVC-%Pred-Post: 97 %
FVC-%Pred-Pre: 104 %
FVC-Post: 2.17 L
FVC-Pre: 2.32 L
Post FEV1/FVC ratio: 80 %
Post FEV6/FVC ratio: 100 %
Pre FEV1/FVC ratio: 79 %
Pre FEV6/FVC Ratio: 100 %
RV % pred: 93 %
RV: 2.2 L
TLC % pred: 93 %
TLC: 4.46 L

## 2018-02-09 MED ORDER — ALBUTEROL SULFATE (2.5 MG/3ML) 0.083% IN NEBU
2.5000 mg | INHALATION_SOLUTION | Freq: Once | RESPIRATORY_TRACT | Status: AC
  Administered 2018-02-09: 2.5 mg via RESPIRATORY_TRACT

## 2018-02-09 NOTE — Therapy (Signed)
[place], [place], [address] Phone: [phone]   Fax:  [phone]  Physical Therapy Evaluation  Patient Details  Name: Mallory Thomas MRN: [id number] Date of Birth: 11-02-1932 Referring Provider: Gaye Pollack, MD   Encounter Date: 02/09/2018  PT End of Session - 02/09/18 1325    Visit Number  1    Authorization Type  [employer] MCR one- time TAVR eval    PT Start Time  1330    PT Stop Time  1410    PT Time Calculation (min)  40 min    Activity Tolerance  Patient tolerated treatment well    Behavior During Therapy  [facility] for tasks assessed/performed       Past Medical History:  Diagnosis Date  . Breast cancer ([place]) 2004   right  . Diabetes mellitus   . Heart murmur   . Hyperlipidemia   . Hypertension     Past Surgical History:  Procedure Laterality Date  . BILATERAL KNEE ARTHROSCOPY    . BREAST LUMPECTOMY Right 2004   radiation   . INTRAVASCULAR ULTRASOUND/IVUS N/A 12/24/2017   Procedure: Intravascular Ultrasound/IVUS;  Surgeon: Adrian Prows, MD;  Location: [place] [facility];  Service: Cardiovascular;  Laterality: N/A;  . lymph node removal right arm    . PERIPHERAL VASCULAR CATHETERIZATION N/A 11/10/2016   Procedure: Lower Extremity Angiography;  Surgeon: Adrian Prows, MD;  Location: [place] [facility];  Service: Cardiovascular;  Laterality: N/A;  . PERIPHERAL VASCULAR CATHETERIZATION Right 11/10/2016   Procedure: Peripheral Vascular Intervention;  Surgeon: Adrian Prows, MD;  Location: [place] [facility];  Service: Cardiovascular;  Laterality: Right;  Rt Com Iliac    There were no vitals filed for this visit.   Subjective Assessment - 02/09/18 1336    Subjective  Denies SOB. I have a murmer that is getting worse. Does water aerobics and does not feel limited in daily activities. Bil buttock pain when walking a block or more- increase in tightness.     Currently in Pain?  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, active a church      Cognition   Overall Cognitive Status  Within Functional Limits for tasks assessed      Sensation   Additional Comments  [facility]      Posture/Postural Control   Posture Comments  WFL      ROM / Strength   AROM / PROM / Strength  Strength      Strength   Overall Strength Comments  gross 5/5    Strength Assessment Site  Hand    Right/Left hand  -- Rt 50lb  Lt 50lb  Ambulation/Gait   Gait Comments  WFL, some bil glut tightness noted after 3 laps during 6MWT       [facility] Pre-Surgical Assessment - 02/09/18 0001    5 Meter Walk Test- trial 1  3 sec    5 Meter Walk Test- trial 2  3 sec.     5 Meter Walk Test- trial 3  4 sec. 107 BPM  RPE 6/20    5 meter walk test average  3.33 sec    4 Stage Balance Test Position  3    comment  full tandem 10s bil    Comment  10s BORG 7, no dyspnea    6 Minute Walk- Baseline  yes    BP (mmHg)  161/82    HR (bpm)  98    02 Sat (%RA)  96 %    6 Minute Walk Post Test  yes    BP (mmHg)  145/60    HR (bpm)  112 up to 133 in test    02 Sat (%RA)  96 %    Modified Borg Scale for Dyspnea  2- Mild shortness of breath    Perceived Rate of Exertion (Borg)  13- Somewhat hard    Aerobic Endurance Distance Walked  1236    Endurance additional comments  3.89% disability            No data recorded  Objective measurements completed on examination: See above  findings.                           Plan - 02/09/18 1324    Clinical Impression Statement  see below    PT Frequency  -- 1x visit for TAVR eval       Patient demonstrated the following deficits and impairments:     Visit Diagnosis: Difficulty in walking, not elsewhere classified   Clinical Impression Statement: Pt is a 84 yo F presenting to OP PT for evaluation prior to possible TAVR surgery due to severe aortic stenosis.  Pt presents with WFL ROM and strength, good balance and is not at high fall risk 4 stage balance test, fast walking speed and good aerobic endurance per 6 minute walk test.. Pt reported 2/10 shortness of breath on modified scale for dyspnea.Pt ambulated a total of 1236 feet in 6 minute walk. HR increased significantly with 6 minute walk test. Based on the Short Physical Performance Battery, patient has a frailty rating of 11/12 with </= 5/12 considered frail.     Problem List Patient Active Problem List   Diagnosis Date Noted  . Hypertension 01/06/2018  . Type 2 diabetes mellitus ([place]) 01/06/2018  . Hypercholesteremia 01/06/2018  . Bilateral carotid bruits 01/06/2018  . 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 Malignant neoplasm of overlapping sites of right breast in female, estrogen receptor positive ([place]) 07/01/2017  . Aortic stenosis 07/01/2017  . Claudication in peripheral vascular disease ([place]) 11/08/2016  . CKD (chronic kidney disease), stage III ([place]) 07/09/2015    Selinda Eon 02/09/2018, 2:18 PM  [facility] [address] [place], [place], [address] Phone: [phone]   Fax:  [phone]  Name: Mallory Thomas MRN: [id number] Date of Birth: 1932-08-15

## 2018-02-09 NOTE — Patient Instructions (Signed)
   Continue taking all current medications without change through the day before surgery.  Have nothing to eat or drink after midnight the night before surgery.  On the morning of surgery do not take any medications   

## 2018-02-09 NOTE — Progress Notes (Signed)
HE[facility] AND [facility] CONSULTATION REPORT  Referring Provider is Adrian Prows, MD PCP is Jani Gravel, MD  Chief Complaint  Patient presents with  . Aortic Stenosis    2nd TAVR eval, review all studies    HPI:  Patient is an 84 year old female with history of aortic stenosis, hypertension, type 2 diabetes mellitus, hyperlipidemia, and history of rheumatic fever during childhood who has been referred for second surgical opinion to discuss treatment options for management of severe aortic stenosis.  Patient states that she has been told that she had a heart murmur ever since she had rheumatic fever at age 23.  She has no other history of cardiac disease.  She has been followed for the last several years by Dr. Einar Gip.  Transthoracic echocardiograms have demonstrated the presence of normal left ventricular systolic function with aortic stenosis that has progressed in severity.  Follow-up echocardiogram performed December 08, 2017 at Dr. Irven Shelling office reportedly revealed further significant progression of disease with severe aortic stenosis.  Peak velocity across the aortic valve was reported 4.3 m/s corresponding to mean transvalvular gradient estimated 47 mmHg and aortic valve area calculated 0.65 cm.  There was normal left ventricular systolic function with ejection fraction calculated 67%.  No other significant abnormalities were noted.  The patient underwent diagnostic cardiac catheterization by Dr. Einar Gip on December 24, 2017.  Transvalvular gradient across the aortic valve was not assessed.  There was single-vessel coronary artery disease with 85% discrete stenosis of the mid right coronary artery.  There was nonobstructive disease in the left coronary distribution.  Pulmonary artery pressures were normal.  The patient was referred to the multidisciplinary heart valve clinic and evaluated by Dr. Cyndia Bent earlier this month.  CT  angiography was performed and the patient was evaluated by Dr. Burt Knack.  The patient has been essentially asymptomatic until recently, although she now admits to mild symptoms of shortness of breath with more strenuous physical exertion.  She also reports occasional dizzy spells.  The patient has not had symptoms consistent with angina pectoris, and transcatheter aortic valve replacement was discussed as an alternative to conventional surgical aortic valve replacement with or without coronary artery bypass grafting.  The patient was referred for surgical consultation.  The patient is widowed and lives alone locally in [place].  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  She has remained remarkably active and entirely functionally independent throughout retirement.  She lives alone, drives an automobile, and takes care of her own home.  She still exercises regularly where she does water aerobics.  She has normal mobility with no significant problems related to any kind of arthritis.  She does describe some exertional shortness of breath with more strenuous physical exertion.  She reports occasional mild dizzy spells without history of syncope.  She is never had any chest pain or chest tightness either with activity or at rest.  She denies any history of PND, orthopnea, or lower extremity edema.   Past Medical History:  Diagnosis Date  . Breast cancer ([place]) 2004   right  . Diabetes mellitus   . Heart murmur   . Hyperlipidemia   . Hypertension     Past Surgical History:  Procedure Laterality Date  . BILATERAL KNEE ARTHROSCOPY    . BREAST LUMPECTOMY Right 2004   radiation   . INTRAVASCULAR ULTRASOUND/IVUS N/A 12/24/2017   Procedure: Intravascular Ultrasound/IVUS;  Surgeon: Adrian Prows, MD;  Location: [facility];  Service: Cardiovascular;  Laterality: N/A;  . lymph node removal right arm    . PERIPHERAL VASCULAR  CATHETERIZATION N/A 11/10/2016   Procedure: Lower Extremity Angiography;  Surgeon: Adrian Prows, MD;  Location: [facility];  Service: Cardiovascular;  Laterality: N/A;  . PERIPHERAL VASCULAR CATHETERIZATION Right 11/10/2016   Procedure: Peripheral Vascular Intervention;  Surgeon: Adrian Prows, MD;  Location: [facility];  Service: Cardiovascular;  Laterality: Right;  Rt Com Iliac    No family history on file.  Social History   Socioeconomic History  . Marital status: Widowed    Spouse name: Not on file  . Number of children: Not on file  . Years of education: Not on file  . Highest education level: Not on file  Occupational History  . Not on file  Social Needs  . Financial resource strain: Not on file  . Food insecurity:    Worry: Not on file    Inability: Not on file  . Transportation needs:    Medical: Not on file    Non-medical: Not on file  Tobacco Use  . Smoking status: Former Smoker    Last attempt to quit: 06/04/1968    Years since quitting: 49.7  . Smokeless tobacco: Never Used  Substance and Sexual Activity  . Alcohol use: Not on file    Comment: occasional  . Drug use: No  . Sexual activity: Not on file  Lifestyle  . Physical activity:    Days per week: Not on file    Minutes per session: Not on file  . Stress: Not on file  Relationships  . Social connections:    Talks on phone: Not on file    Gets together: Not on file    Attends religious service: Not on file    Active member of club or organization: Not on file    Attends meetings of clubs or organizations: Not on file    Relationship status: Not on file  . Intimate partner violence:    Fear of current or ex partner: Not on file    Emotionally abused: Not on file    Physically abused: Not on file    Forced sexual activity: Not on file  Other Topics Concern  . Not on file  Social History Narrative  . Not on file    Current Outpatient Medications  Medication Sig Dispense Refill  .  aspirin 81 MG tablet Take 81 mg by mouth at bedtime.     Marland Kitchen atorvastatin (LIPITOR) 80 MG tablet Take 80 mg by mouth at bedtime.     . colestipol (COLESTID) 1 g tablet Take 1 g by mouth daily before lunch.     . irbesartan (AVAPRO) 300 MG tablet Take 300 mg by mouth daily.     . saxagliptin HCl (ONGLYZA) 5 MG TABS tablet Take 1 tablet (5 mg total) by mouth daily. 30 tablet    No current facility-administered medications for this visit.     Allergies  Allergen Reactions  . Fish Allergy Hives  . Iodine Other (See Comments)    Allergic to ALL seafood, shrimp, crab etc.  . Shellfish Allergy Hives  . Tape Hives  . Zetia [Ezetimibe] Diarrhea      Review of Systems:   General:  normal appetite, normal energy, no weight gain, no weight loss, no fever  Cardiac:  no chest pain with exertion, no chest pain at rest, +SOB  with exertion, no resting SOB, no PND, no orthopnea, no palpitations, no arrhythmia, no atrial fibrillation, no LE edema, + dizzy spells, no syncope  Respiratory:  no shortness of breath, no home oxygen, no productive cough, no dry cough, no bronchitis, no wheezing, no hemoptysis, no asthma, no pain with inspiration or cough, no sleep apnea, no CPAP at night  GI:   no difficulty swallowing, no reflux, no frequent heartburn, no hiatal hernia, no abdominal pain, no constipation, no diarrhea, no hematochezia, no hematemesis, no melena  GU:   no dysuria,  no frequency, no urinary tract infection, no hematuria, no kidney stones, no kidney disease  Vascular:  no pain suggestive of claudication, no pain in feet, no leg cramps, no varicose veins, no DVT, no non-healing foot ulcer  Neuro:   no stroke, no TIA's, no seizures, no headaches, no temporary blindness one eye,  no slurred speech, no peripheral neuropathy, no chronic pain, no instability of gait, no memory/cognitive dysfunction  Musculoskeletal: no arthritis, no joint swelling, no myalgias, no difficulty walking, normal mobility    Skin:   no rash, no itching, no skin infections, no pressure sores or ulcerations  Psych:   no anxiety, no depression, no nervousness, no unusual recent stress  Eyes:   no blurry vision, no floaters, no recent vision changes, no wears glasses or contacts  ENT:   no hearing loss, no loose or painful teeth, no dentures, last saw dentist 6 months ago  Hematologic:  no easy bruising, no abnormal bleeding, no clotting disorder, no frequent epistaxis  Endocrine:  + diabetes, does  check CBG's at home           Physical Exam:   BP 116/67   Pulse 100   Resp 20   Ht 5\' 2"  (1.575 m)   Wt 185 lb (83.9 kg)   SpO2 95% Comment: RA  BMI 33.84 kg/m   General:  Mildly obese,  well-appearing  HEENT:  Unremarkable   Neck:   no JVD, no bruits, no adenopathy   Chest:   clear to auscultation, symmetrical breath sounds, no wheezes, no rhonchi   CV:   RRR, grade III/VI crescendo/decrescendo murmur heard best at RSB,  no diastolic murmur  Abdomen:  soft, non-tender, no masses   Extremities:  warm, well-perfused, pulses diminished but palpable, no LE edema  Rectal/GU  Deferred  Neuro:   Grossly non-focal and symmetrical throughout  Skin:   Clean and dry, no rashes, no breakdown   Diagnostic Tests:  TRANSTHORACIC ECHOCARDIOGRAM  Report from transthoracic echocardiogram performed December 08, 2017 at [facility] cardiovascular was reviewed.  Images from this examination are not currently available for review.  By report there was severe aortic stenosis.  There was reportedly mild aortic valve leaflet calcification with moderately restricted leaflet mobility.  Peak velocity across the aortic valve was reported 4.3 m/s corresponding to mean transvalvular gradient estimated 47 mmHg and aortic valve area calculated 0.65 cm.  There was normal left ventricular systolic function with ejection fraction calculated 67%.  Intravascular Ultrasound/IVUS  RIGHT HEART CATH AND CORONARY ANGIOGRAPHY  Conclusion    Coronary angiography 12/24/2017:  Mid RCA lesion is 85% stenosed. The lesion is type C. The lesion is calcified. Heavily calcified RCA stenosis. Ostium 2.61 x 2.95 mm area of 5.93 mm with circumferential calcification. Reference lumen 3.81 x 4.33 mm with area of 13.29 mm. Mid segment circumferential calcification with severe stenosis, 1.69 x 1.85 mm, area 2.48 mm. Unable to pass the catheter beyond  this lesion, angiographically there appears to be calcified stenosis, probably high-grade.   There is heavy calcification evident across the aortic valve.  Left coronary arteries are widely patent however diffuse coronary calcification is evident both the LAD and circumflex.  Right heart catheterization: RA 7/4, mean 3 mmHg.  RV 39/1, EDP 8 mmHg. PA 44/18, mean 29 mmHg.  PA saturation 65%.  PW 21/20, mean 16 mmHg.  Cardiac output 5.53, cardiac index 2.96 by Fick.  Recommendation: Patient will need aortic valve replacement.  I discussed with surgical team regarding options for revascularization of right coronary artery.  She will be discharged home today with outpatient follow-up.  100 mL contrast utilized.  Indications   Shortness of breath [R06.02 (ICD-10-CM)]  Aortic stenosis, severe [I35.0 (ICD-10-CM)]  Procedural Details/Technique   Technical Details Procedure performed: Right radial arterial access and right antecubital vein access for left and right heart catheterization, abandoned right antecubital axis as I was unable to torque the catheter due to right subclavian vein stenosis, right femoral vein access using micropuncture technique, IVUS RCA.  Indication: Patient with hypertension, hyperlipidemia, peripheral arterial disease with history of right common iliac artery stenting with self-expanding stent in 2017, history of breast cancer status post radiation therapy to the chest, who has been having worsening shortness of breath and dyspnea on exertion. Outpatient echocardiogram on  12/08/2017 showed a aortic valve peak gradient of 74 and a mean gradient of 47 mmHg and aortic valve area of 0.65 cm. Hyperdynamic LVEF and moderate LVH. Given symptomatic aortic stenosis, dyspnea on exertion, she was brought to the cardiac catheterization lab to evaluate coronary anatomy and also right heart catheterization was performed to evaluate for pulmonary hypertension and to evaluate for cardiac output and cardiac index.  Technique: I utilized right antecubital vein access 5 [place], and advanced the 5 French balloon tip catheter with the help of a Glidewire to cross the subclavian vein stenosis which had performed angiogram on. I was able to get into the right ventricle however I was not able to manipulate the catheter. Hence that procedure was abandoned and I obtained a right femoral venous access and a 7 [place] sheath was introduced. I performed right heart catheterization without any complications. The catheters were then pulled out of the body in the usual fashion.  Right radial arterial access was obtained. A tiger 4.0 catheter was utilized to perform selective right coronary angiography and a JL 3.55 [place] diagnostic catheter was utilized to engage the left main and angiography was performed.  Technique left IVUS: Using heparin for anticoagulation, keeping ACT greater than 200 seconds, advanced a cougar wire followed by IVUS catheter, Opticross and attempted to cross the mid RCA calcific stenosis but I was unable to. I did perform angiography and also performed IVUS interrogation of the mid segment and ostium of the right coronary artery which was significant stenosis and heavy circumferential calcification present. Significant EKG abnormalities were evident with engagement of the catheter to the right coronary artery.  The guidewire was withdrawn, IVUS catheter pulled out of the body. Angiography performed, no complications, guide cath were disengaged and pulled out of the body of the  J-wire.   Estimated blood loss <50 mL.  During this procedure the patient was administered the following to achieve and maintain moderate conscious sedation: Versed 2 mg, Fentanyl 25 mcg, while the patient's heart rate, blood pressure, and oxygen saturation were continuously monitored. The period of conscious sedation was 88 minutes, of which I was present face-to-face 100% of  this time.  Coronary Findings   Diagnostic  Dominance: Right  Left Anterior Descending  The vessel exhibits minimal luminal irregularities.  Left Circumflex  The vessel exhibits minimal luminal irregularities.  Right Coronary Artery  Ost RCA to Prox RCA lesion 80% stenosed  Ost RCA to Prox RCA lesion is 80% stenosed. The lesion is discrete. The lesion is calcified.  Mid RCA lesion 85% stenosed  Mid RCA lesion is 85% stenosed. The lesion is type C. The lesion is calcified. Heavily calcified RCA stenosis. Ostium 2.61 x 2.95 mm area of 5.93 mm with circumferential calcification. Reference lumen 3.81 x 4.33 mm with area of 13.29 mm. Mid segment circumferential calcification with severe stenosis, 1.69 x 1.85 mm, area 2.48 mm. Unable to pass the catheter beyond this lesion, angiographically there appears to be calcified stenosis, probably high-grade.  Intervention   No interventions have been documented.  Right Heart   Right Heart Pressures Hemodynamic findings consistent with mild pulmonary hypertension. RA 7/4, mean 3 mmHg. RV 39/1, EDP 8 mmHg. PA 44/18, mean 29 mmHg. PA saturation 65%. PW 21/20, mean 16 mmHg. Cardiac output 5.53, cardiac index 2.96 by Fick.  Coronary Diagrams   Diagnostic Diagram       Implants    No implant documentation for this case.  MERGE Images   Show images for CARDIAC CATHETERIZATION   Link to Procedure Log   Procedure Log    Hemo Data    Most Recent Value  Fick Cardiac Output 5.53 L/min  Fick Cardiac Output Index 2.96 (L/min)/BSA  RA A Wave 7 mmHg  RA V Wave 4 mmHg  RA  Mean 3 mmHg  RV Systolic Pressure 39 mmHg  RV Diastolic Pressure 1 mmHg  RV EDP 8 mmHg  PA Systolic Pressure 44 mmHg  PA Diastolic Pressure 18 mmHg  PA Mean 29 mmHg  PW A Wave 21 mmHg  PW V Wave 20 mmHg  PW Mean 16 mmHg  AO Systolic Pressure 076 mmHg  AO Diastolic Pressure 68 mmHg  AO Mean 103 mmHg  QP/QS 1  TPVR Index 9.8 HRUI  TSVR Index 34.81 HRUI  PVR SVR Ratio 0.13  TPVR/TSVR Ratio 0.28    Cardiac TAVR CT  TECHNIQUE: The patient was scanned on a [employer]. A 120 kV retrospective scan was triggered in the descending thoracic aorta at 111 HU's. Gantry rotation speed was 250 msecs and collimation was .6 mm. No beta blockade or nitro were given. The 3D data set was reconstructed in 5% intervals of the R-R cycle. Systolic and diastolic phases were analyzed on a dedicated work station using MPR, MIP and VRT modes. The patient received 80 cc of contrast.  FINDINGS: Aortic Valve: Trileaflet, severely thickened and calcified aortic valve with severely restricted leaflet opening, no calcifications are extending into the LVOT.  Aorta: Normal size, moderate diffuse calcifications and atherosclerotic plaque.  Sinotubular Junction: 26 x 26 mm  Ascending Thoracic Aorta: 35 x 33 mm  Aortic Arch: 29 x 25 mm  Descending Thoracic Aorta: 23 x 22 mm  Sinus of Valsalva Measurements:  Non-coronary: 28 mm  Right -coronary: 29 mm  Left -coronary: 30 mm  Coronary Artery Height above Annulus:  Left Main: 13 mm  Right Coronary: 16 mm  Virtual Basal Annulus Measurements:  Maximum/Minimum Diameter: 24.4 x 22.4 mm  Mean Diameter: 22.9 mm  Area derived Diameter: 413 mm2  Perimeter: 73.2 mm  Optimum Fluoroscopic Angle for Delivery: LAO 4 CAU 3  IMPRESSION: 1. Trileaflet, severely  thickened and calcified aortic valve with severely restricted leaflet opening, no calcifications are extending into the LVOT. Annular measurements suitable  for delivery of a 23 mm Edwards-SAPIEN 3 valve.  2. Sufficient coronary to annulus distance.  3. Optimum Fluoroscopic Angle for Delivery: LAO 4 CAU 3  4. No thrombus in the left atrial appendage.   Electronically Signed   By: Ena Dawley   On: 01/11/2018 18:25      CT ANGIOGRAPHY CHEST, ABDOMEN AND PELVIS  TECHNIQUE: Multidetector CT imaging through the chest, abdomen and pelvis was performed using the standard protocol during bolus administration of intravenous contrast. Multiplanar reconstructed images and MIPs were obtained and reviewed to evaluate the vascular anatomy.  CONTRAST:  100 mL of Isovue 370.  COMPARISON:  None.  FINDINGS: CTA CHEST FINDINGS  Cardiovascular: Heart size is mildly enlarged with left atrial dilatation. There is no significant pericardial fluid, thickening or pericardial calcification. There is aortic atherosclerosis, as well as atherosclerosis of the great vessels of the mediastinum and the coronary arteries, including calcified atherosclerotic plaque in the left main, left anterior descending, left circumflex and right coronary arteries. Severe thickening and calcification of the aortic valve.  Mediastinum/Lymph Nodes: No pathologically enlarged mediastinal or hilar lymph nodes. Large hiatal hernia. No axillary lymphadenopathy.  Lungs/Pleura: Cluster of peribronchovascular micronodularity in the periphery of the right upper lobe, nonspecific but statistically likely to reflect benign areas of mucoid impaction within the terminal bronchioles. No confluent consolidative airspace disease. No pleural effusions. Mosaic attenuation throughout the lungs bilaterally suggestive of a background of air trapping from small airways disease. Thin-walled cyst in the medial aspect of the right lower lobe incidentally noted.  Musculoskeletal/Soft Tissues: There are no aggressive appearing lytic or blastic lesions noted in the  visualized portions of the skeleton.  CTA ABDOMEN AND PELVIS FINDINGS  Hepatobiliary: No suspicious cystic or solid hepatic lesions. No intra or extrahepatic biliary ductal dilatation. Gallbladder is normal in appearance.  Pancreas: No pancreatic mass. No pancreatic ductal dilatation. No pancreatic or peripancreatic fluid or inflammatory changes.  Spleen: Unremarkable.  Adrenals/Urinary Tract: Diffuse mild-to-moderate cortical thinning in both kidneys. No suspicious renal lesions. No hydroureteronephrosis. Urinary bladder is normal in appearance. Bilateral adrenal glands are normal in appearance.  Stomach/Bowel: Intra-abdominal portion of the stomach is unremarkable. No pathologic dilatation of small bowel or colon. Numerous colonic diverticulae are noted, without surrounding inflammatory changes to suggest an acute diverticulitis at this time. Normal appendix.  Vascular/Lymphatic: Aortic atherosclerosis, without evidence of aneurysm or dissection in the abdominal or pelvic vasculature. Vascular findings and measurements pertinent to potential TAVR procedure, as detailed below. Celiac axis and inferior mesenteric artery are both widely patent without hemodynamically significant stenosis. Moderate stenosis of the ostium of the superior mesenteric artery. Two right and one left renal arteries are all widely patent without hemodynamically significant stenosis. No lymphadenopathy noted in the abdomen or pelvis.  Reproductive: Uterus and ovaries are unremarkable in appearance.  Other: No significant volume of ascites.  No pneumoperitoneum.  Musculoskeletal: There are no aggressive appearing lytic or blastic lesions noted in the visualized portions of the skeleton.  VASCULAR MEASUREMENTS PERTINENT TO TAVR:  AORTA:  Minimal Aortic Diameter-8 x 8 mm  Severity of Aortic Calcification-severe  RIGHT PELVIS:  Right Common Iliac Artery -  Minimal Diameter-7.0  x 6.6 mm  Tortuosity-mild  Calcification-moderate  Right External Iliac Artery -  Minimal Diameter-5.9 x 5.7 mm  Tortuosity-mild  Calcification-none  Right Common Femoral Artery -  Minimal Diameter-6.2 x 4.0 mm  Tortuosity-mild  Calcification-mild  LEFT PELVIS:  Left Common Iliac Artery -  Minimal Diameter-6.1 x 5.8 mm  Tortuosity-mild  Calcification-moderate  Left External Iliac Artery -  Minimal Diameter-5.1 x 5.3 mm  Tortuosity-mild  Calcification-none  Left Common Femoral Artery -  Minimal Diameter-6.3 x 5.3 mm  Tortuosity-mild  Calcification-mild  Review of the MIP images confirms the above findings.  IMPRESSION: 1. Vascular findings and measurements pertinent to potential TAVR procedure, as detailed above. 2. Severe thickening calcification of the aortic valve, compatible with patient's reported clinical history of severe aortic stenosis. 3. Aortic atherosclerosis, in addition to left main and 3 vessel coronary artery disease. 4. Large hiatal hernia. This may have implications at the time of upcoming TAVR procedure. 5. Cardiomegaly with mild left atrial dilatation. 6. Mild colonic diverticulosis without evidence to suggest an acute diverticulitis at this time. 7. Additional incidental findings, as above.  Aortic Atherosclerosis (ICD10-I70.0).   Electronically Signed   By: Vinnie Langton M.D.   On: 01/11/2018 13:24    Impression:  Patient has stage D severe symptomatic aortic stenosis.  She describes mild symptoms of exertional shortness of breath consistent with chronic diastolic congestive heart failure, New York Heart Association functional class I-II.  She also has recently developed occasional dizzy spells without syncope.  She denies any history of chest pain or chest tightness either with activity or at rest.  I have personally reviewed the report from her recent transthoracic echocardiogram, although  images from this examination are not currently available for review.  By report the patient has severe aortic stenosis with normal left ventricular systolic function.  I have personally reviewed the patient's recent diagnostic cardiac catheterization and CT angiograms.  Catheterization reveals a single discrete high-grade stenosis in the mid right coronary artery which was found to be flow-limiting based on FFR analysis.  There is nonobstructive coronary artery disease in the left coronary territory.  The patient is not having angina pectoris.  Right heart pressures were normal.  I agree the patient would benefit from aortic valve replacement.  Risks associated with conventional surgical aortic valve replacement with or without coronary artery bypass grafting would be moderately elevated because of the patient's age.  Cardiac-gated CTA of the heart reveals anatomical characteristics consistent with aortic stenosis suitable for treatment by transcatheter aortic valve replacement without any significant complicating features and CTA of the aorta and iliac vessels demonstrate what appears to be adequate pelvic vascular access to facilitate a transfemoral approach.  I agree that transcatheter aortic valve replacement would be reasonable alternative to to conventional surgical aortic valve replacement with plans for continued medical therapy and follow-up related to the patient's underlying coronary artery disease.  Right coronary artery stenosis could be treated with PCI and stenting if the patient were to develop symptoms.    Plan:  The patient was counseled at length regarding treatment alternatives for management of severe symptomatic aortic stenosis. Alternative approaches such as conventional aortic valve replacement with coronary artery bypass grafting, transcatheter aortic valve replacement, and continued medical therapy without intervention were compared and contrasted at length.  The risks associated with  conventional surgical aortic valve replacement were discussed in detail, as were expectations for post-operative convalescence.  Issues specific to transcatheter aortic valve replacement were discussed including questions about long term valve durability, the potential for paravalvular leak, possible increased risk of need for permanent pacemaker placement, and other technical complications related to the procedure itself.  Long-term prognosis with medical therapy was discussed. This discussion  was placed in the context of the patient's own specific clinical presentation and past medical history.  All of her questions have been addressed.  The patient desires to proceed with transcatheter aortic valve replacement as soon as practical.  We tentatively plan for surgery on February 15, 2018.  Following the decision to proceed with transcatheter aortic valve replacement, a discussion has been held regarding what types of management strategies would be attempted intraoperatively in the event of life-threatening complications, including whether or not the patient would be considered a candidate for the use of cardiopulmonary bypass and/or conversion to open sternotomy for attempted surgical intervention.  The patient has been advised of a variety of complications that might develop including but not limited to risks of death, stroke, paravalvular leak, aortic dissection or other major vascular complications, aortic annulus rupture, device embolization, cardiac rupture or perforation, mitral regurgitation, acute myocardial infarction, arrhythmia, heart block or bradycardia requiring permanent pacemaker placement, congestive heart failure, respiratory failure, renal failure, pneumonia, infection, other late complications related to structural valve deterioration or migration, or other complications that might ultimately cause a temporary or permanent loss of functional independence or other long term morbidity.  The patient  provides full informed consent for the procedure as described and all questions were answered.   I spent in excess of 90 minutes during the conduct of this office consultation and >50% of this time involved direct face-to-face encounter with the patient for counseling and/or coordination of their care.     Valentina Gu. Roxy Manns, MD 02/09/2018 3:04 PM

## 2018-02-09 NOTE — H&P (View-Only) (Signed)
HE[facility] CONSULTATION REPORT  Referring Provider is Adrian Prows, MD PCP is Jani Gravel, MD  Chief Complaint  Patient presents with  . Aortic Stenosis    2nd TAVR eval, review all studies    HPI:  Patient is an 84 year old female with history of aortic stenosis, hypertension, type 2 diabetes mellitus, hyperlipidemia, and history of rheumatic fever during childhood who has been referred for second surgical opinion to discuss treatment options for management of severe aortic stenosis.  Patient states that she has been told that she had a heart murmur ever since she had rheumatic fever at age 63.  She has no other history of cardiac disease.  She has been followed for the last several years by Dr. Einar Gip.  Transthoracic echocardiograms have demonstrated the presence of normal left ventricular systolic function with aortic stenosis that has progressed in severity.  Follow-up echocardiogram performed December 08, 2017 at Dr. Irven Shelling office reportedly revealed further significant progression of disease with severe aortic stenosis.  Peak velocity across the aortic valve was reported 4.3 m/s corresponding to mean transvalvular gradient estimated 47 mmHg and aortic valve area calculated 0.65 cm.  There was normal left ventricular systolic function with ejection fraction calculated 67%.  No other significant abnormalities were noted.  The patient underwent diagnostic cardiac catheterization by Dr. Einar Gip on December 24, 2017.  Transvalvular gradient across the aortic valve was not assessed.  There was single-vessel coronary artery disease with 85% discrete stenosis of the mid right coronary artery.  There was nonobstructive disease in the left coronary distribution.  Pulmonary artery pressures were normal.  The patient was referred to the multidisciplinary heart valve clinic and evaluated by Dr. Cyndia Bent earlier this month.  CT  angiography was performed and the patient was evaluated by Dr. Burt Knack.  The patient has been essentially asymptomatic until recently, although she now admits to mild symptoms of shortness of breath with more strenuous physical exertion.  She also reports occasional dizzy spells.  The patient has not had symptoms consistent with angina pectoris, and transcatheter aortic valve replacement was discussed as an alternative to conventional surgical aortic valve replacement with or without coronary artery bypass grafting.  The patient was referred for surgical consultation.  The patient is widowed and lives alone locally in [place].  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  She has remained remarkably active and entirely functionally independent throughout retirement.  She lives alone, drives an automobile, and takes care of her own home.  She still exercises regularly where she does water aerobics.  She has normal mobility with no significant problems related to any kind of arthritis.  She does describe some exertional shortness of breath with more strenuous physical exertion.  She reports occasional mild dizzy spells without history of syncope.  She is never had any chest pain or chest tightness either with activity or at rest.  She denies any history of PND, orthopnea, or lower extremity edema.   Past Medical History:  Diagnosis Date  . Breast cancer ([place]) 2004   right  . Diabetes mellitus   . Heart murmur   . Hyperlipidemia   . Hypertension     Past Surgical History:  Procedure Laterality Date  . BILATERAL KNEE ARTHROSCOPY    . BREAST LUMPECTOMY Right 2004   radiation   . INTRAVASCULAR ULTRASOUND/IVUS N/A 12/24/2017   Procedure: Intravascular Ultrasound/IVUS;  Surgeon: Adrian Prows, MD;  Location: [facility];  Service: Cardiovascular;  Laterality: N/A;  . lymph node removal right arm    . PERIPHERAL VASCULAR  CATHETERIZATION N/A 11/10/2016   Procedure: Lower Extremity Angiography;  Surgeon: Adrian Prows, MD;  Location: [facility];  Service: Cardiovascular;  Laterality: N/A;  . PERIPHERAL VASCULAR CATHETERIZATION Right 11/10/2016   Procedure: Peripheral Vascular Intervention;  Surgeon: Adrian Prows, MD;  Location: [facility];  Service: Cardiovascular;  Laterality: Right;  Rt Com Iliac    No family history on file.  Social History   Socioeconomic History  . Marital status: Widowed    Spouse name: Not on file  . Number of children: Not on file  . Years of education: Not on file  . Highest education level: Not on file  Occupational History  . Not on file  Social Needs  . Financial resource strain: Not on file  . Food insecurity:    Worry: Not on file    Inability: Not on file  . Transportation needs:    Medical: Not on file    Non-medical: Not on file  Tobacco Use  . Smoking status: Former Smoker    Last attempt to quit: 06/04/1968    Years since quitting: 49.7  . Smokeless tobacco: Never Used  Substance and Sexual Activity  . Alcohol use: Not on file    Comment: occasional  . Drug use: No  . Sexual activity: Not on file  Lifestyle  . Physical activity:    Days per week: Not on file    Minutes per session: Not on file  . Stress: Not on file  Relationships  . Social connections:    Talks on phone: Not on file    Gets together: Not on file    Attends religious service: Not on file    Active member of club or organization: Not on file    Attends meetings of clubs or organizations: Not on file    Relationship status: Not on file  . Intimate partner violence:    Fear of current or ex partner: Not on file    Emotionally abused: Not on file    Physically abused: Not on file    Forced sexual activity: Not on file  Other Topics Concern  . Not on file  Social History Narrative  . Not on file    Current Outpatient Medications  Medication Sig Dispense Refill  .  aspirin 81 MG tablet Take 81 mg by mouth at bedtime.     Marland Kitchen atorvastatin (LIPITOR) 80 MG tablet Take 80 mg by mouth at bedtime.     . colestipol (COLESTID) 1 g tablet Take 1 g by mouth daily before lunch.     . irbesartan (AVAPRO) 300 MG tablet Take 300 mg by mouth daily.     . saxagliptin HCl (ONGLYZA) 5 MG TABS tablet Take 1 tablet (5 mg total) by mouth daily. 30 tablet    No current facility-administered medications for this visit.     Allergies  Allergen Reactions  . Fish Allergy Hives  . Iodine Other (See Comments)    Allergic to ALL seafood, shrimp, crab etc.  . Shellfish Allergy Hives  . Tape Hives  . Zetia [Ezetimibe] Diarrhea      Review of Systems:   General:  normal appetite, normal energy, no weight gain, no weight loss, no fever  Cardiac:  no chest pain with exertion, no chest pain at rest, +SOB  with exertion, no resting SOB, no PND, no orthopnea, no palpitations, no arrhythmia, no atrial fibrillation, no LE edema, + dizzy spells, no syncope  Respiratory:  no shortness of breath, no home oxygen, no productive cough, no dry cough, no bronchitis, no wheezing, no hemoptysis, no asthma, no pain with inspiration or cough, no sleep apnea, no CPAP at night  GI:   no difficulty swallowing, no reflux, no frequent heartburn, no hiatal hernia, no abdominal pain, no constipation, no diarrhea, no hematochezia, no hematemesis, no melena  GU:   no dysuria,  no frequency, no urinary tract infection, no hematuria, no kidney stones, no kidney disease  Vascular:  no pain suggestive of claudication, no pain in feet, no leg cramps, no varicose veins, no DVT, no non-healing foot ulcer  Neuro:   no stroke, no TIA's, no seizures, no headaches, no temporary blindness one eye,  no slurred speech, no peripheral neuropathy, no chronic pain, no instability of gait, no memory/cognitive dysfunction  Musculoskeletal: no arthritis, no joint swelling, no myalgias, no difficulty walking, normal mobility    Skin:   no rash, no itching, no skin infections, no pressure sores or ulcerations  Psych:   no anxiety, no depression, no nervousness, no unusual recent stress  Eyes:   no blurry vision, no floaters, no recent vision changes, no wears glasses or contacts  ENT:   no hearing loss, no loose or painful teeth, no dentures, last saw dentist 6 months ago  Hematologic:  no easy bruising, no abnormal bleeding, no clotting disorder, no frequent epistaxis  Endocrine:  + diabetes, does  check CBG's at home           Physical Exam:   BP 116/67   Pulse 100   Resp 20   Ht 5\' 2"  (1.575 m)   Wt 185 lb (83.9 kg)   SpO2 95% Comment: RA  BMI 33.84 kg/m   General:  Mildly obese,  well-appearing  HEENT:  Unremarkable   Neck:   no JVD, no bruits, no adenopathy   Chest:   clear to auscultation, symmetrical breath sounds, no wheezes, no rhonchi   CV:   RRR, grade III/VI crescendo/decrescendo murmur heard best at RSB,  no diastolic murmur  Abdomen:  soft, non-tender, no masses   Extremities:  warm, well-perfused, pulses diminished but palpable, no LE edema  Rectal/GU  Deferred  Neuro:   Grossly non-focal and symmetrical throughout  Skin:   Clean and dry, no rashes, no breakdown   Diagnostic Tests:  TRANSTHORACIC ECHOCARDIOGRAM  Report from transthoracic echocardiogram performed December 08, 2017 at [facility] cardiovascular was reviewed.  Images from this examination are not currently available for review.  By report there was severe aortic stenosis.  There was reportedly mild aortic valve leaflet calcification with moderately restricted leaflet mobility.  Peak velocity across the aortic valve was reported 4.3 m/s corresponding to mean transvalvular gradient estimated 47 mmHg and aortic valve area calculated 0.65 cm.  There was normal left ventricular systolic function with ejection fraction calculated 67%.  Intravascular Ultrasound/IVUS  RIGHT HEART CATH AND CORONARY ANGIOGRAPHY  Conclusion    Coronary angiography 12/24/2017:  Mid RCA lesion is 85% stenosed. The lesion is type C. The lesion is calcified. Heavily calcified RCA stenosis. Ostium 2.61 x 2.95 mm area of 5.93 mm with circumferential calcification. Reference lumen 3.81 x 4.33 mm with area of 13.29 mm. Mid segment circumferential calcification with severe stenosis, 1.69 x 1.85 mm, area 2.48 mm. Unable to pass the catheter beyond  this lesion, angiographically there appears to be calcified stenosis, probably high-grade.   There is heavy calcification evident across the aortic valve.  Left coronary arteries are widely patent however diffuse coronary calcification is evident both the LAD and circumflex.  Right heart catheterization: RA 7/4, mean 3 mmHg.  RV 39/1, EDP 8 mmHg. PA 44/18, mean 29 mmHg.  PA saturation 65%.  PW 21/20, mean 16 mmHg.  Cardiac output 5.53, cardiac index 2.96 by Fick.  Recommendation: Patient will need aortic valve replacement.  I discussed with surgical team regarding options for revascularization of right coronary artery.  She will be discharged home today with outpatient follow-up.  100 mL contrast utilized.  Indications   Shortness of breath [R06.02 (ICD-10-CM)]  Aortic stenosis, severe [I35.0 (ICD-10-CM)]  Procedural Details/Technique   Technical Details Procedure performed: Right radial arterial access and right antecubital vein access for left and right heart catheterization, abandoned right antecubital axis as I was unable to torque the catheter due to right subclavian vein stenosis, right femoral vein access using micropuncture technique, IVUS RCA.  Indication: Patient with hypertension, hyperlipidemia, peripheral arterial disease with history of right common iliac artery stenting with self-expanding stent in 2017, history of breast cancer status post radiation therapy to the chest, who has been having worsening shortness of breath and dyspnea on exertion. Outpatient echocardiogram on  12/08/2017 showed a aortic valve peak gradient of 74 and a mean gradient of 47 mmHg and aortic valve area of 0.65 cm. Hyperdynamic LVEF and moderate LVH. Given symptomatic aortic stenosis, dyspnea on exertion, she was brought to the cardiac catheterization lab to evaluate coronary anatomy and also right heart catheterization was performed to evaluate for pulmonary hypertension and to evaluate for cardiac output and cardiac index.  Technique: I utilized right antecubital vein access 5 [place], and advanced the 5 French balloon tip catheter with the help of a Glidewire to cross the subclavian vein stenosis which had performed angiogram on. I was able to get into the right ventricle however I was not able to manipulate the catheter. Hence that procedure was abandoned and I obtained a right femoral venous access and a 7 [place] sheath was introduced. I performed right heart catheterization without any complications. The catheters were then pulled out of the body in the usual fashion.  Right radial arterial access was obtained. A tiger 4.0 catheter was utilized to perform selective right coronary angiography and a JL 3.55 [place] diagnostic catheter was utilized to engage the left main and angiography was performed.  Technique left IVUS: Using heparin for anticoagulation, keeping ACT greater than 200 seconds, advanced a cougar wire followed by IVUS catheter, Opticross and attempted to cross the mid RCA calcific stenosis but I was unable to. I did perform angiography and also performed IVUS interrogation of the mid segment and ostium of the right coronary artery which was significant stenosis and heavy circumferential calcification present. Significant EKG abnormalities were evident with engagement of the catheter to the right coronary artery.  The guidewire was withdrawn, IVUS catheter pulled out of the body. Angiography performed, no complications, guide cath were disengaged and pulled out of the body of the  J-wire.   Estimated blood loss <50 mL.  During this procedure the patient was administered the following to achieve and maintain moderate conscious sedation: Versed 2 mg, Fentanyl 25 mcg, while the patient's heart rate, blood pressure, and oxygen saturation were continuously monitored. The period of conscious sedation was 88 minutes, of which I was present face-to-face 100% of  this time.  Coronary Findings   Diagnostic  Dominance: Right  Left Anterior Descending  The vessel exhibits minimal luminal irregularities.  Left Circumflex  The vessel exhibits minimal luminal irregularities.  Right Coronary Artery  Ost RCA to Prox RCA lesion 80% stenosed  Ost RCA to Prox RCA lesion is 80% stenosed. The lesion is discrete. The lesion is calcified.  Mid RCA lesion 85% stenosed  Mid RCA lesion is 85% stenosed. The lesion is type C. The lesion is calcified. Heavily calcified RCA stenosis. Ostium 2.61 x 2.95 mm area of 5.93 mm with circumferential calcification. Reference lumen 3.81 x 4.33 mm with area of 13.29 mm. Mid segment circumferential calcification with severe stenosis, 1.69 x 1.85 mm, area 2.48 mm. Unable to pass the catheter beyond this lesion, angiographically there appears to be calcified stenosis, probably high-grade.  Intervention   No interventions have been documented.  Right Heart   Right Heart Pressures Hemodynamic findings consistent with mild pulmonary hypertension. RA 7/4, mean 3 mmHg. RV 39/1, EDP 8 mmHg. PA 44/18, mean 29 mmHg. PA saturation 65%. PW 21/20, mean 16 mmHg. Cardiac output 5.53, cardiac index 2.96 by Fick.  Coronary Diagrams   Diagnostic Diagram       Implants    No implant documentation for this case.  MERGE Images   Show images for CARDIAC CATHETERIZATION   Link to Procedure Log   Procedure Log    Hemo Data    Most Recent Value  Fick Cardiac Output 5.53 L/min  Fick Cardiac Output Index 2.96 (L/min)/BSA  RA A Wave 7 mmHg  RA V Wave 4 mmHg  RA  Mean 3 mmHg  RV Systolic Pressure 39 mmHg  RV Diastolic Pressure 1 mmHg  RV EDP 8 mmHg  PA Systolic Pressure 44 mmHg  PA Diastolic Pressure 18 mmHg  PA Mean 29 mmHg  PW A Wave 21 mmHg  PW V Wave 20 mmHg  PW Mean 16 mmHg  AO Systolic Pressure 295 mmHg  AO Diastolic Pressure 68 mmHg  AO Mean 103 mmHg  QP/QS 1  TPVR Index 9.8 HRUI  TSVR Index 34.81 HRUI  PVR SVR Ratio 0.13  TPVR/TSVR Ratio 0.28    Cardiac TAVR CT  TECHNIQUE: The patient was scanned on a [employer]. A 120 kV retrospective scan was triggered in the descending thoracic aorta at 111 HU's. Gantry rotation speed was 250 msecs and collimation was .6 mm. No beta blockade or nitro were given. The 3D data set was reconstructed in 5% intervals of the R-R cycle. Systolic and diastolic phases were analyzed on a dedicated work station using MPR, MIP and VRT modes. The patient received 80 cc of contrast.  FINDINGS: Aortic Valve: Trileaflet, severely thickened and calcified aortic valve with severely restricted leaflet opening, no calcifications are extending into the LVOT.  Aorta: Normal size, moderate diffuse calcifications and atherosclerotic plaque.  Sinotubular Junction: 26 x 26 mm  Ascending Thoracic Aorta: 35 x 33 mm  Aortic Arch: 29 x 25 mm  Descending Thoracic Aorta: 23 x 22 mm  Sinus of Valsalva Measurements:  Non-coronary: 28 mm  Right -coronary: 29 mm  Left -coronary: 30 mm  Coronary Artery Height above Annulus:  Left Main: 13 mm  Right Coronary: 16 mm  Virtual Basal Annulus Measurements:  Maximum/Minimum Diameter: 24.4 x 22.4 mm  Mean Diameter: 22.9 mm  Area derived Diameter: 413 mm2  Perimeter: 73.2 mm  Optimum Fluoroscopic Angle for Delivery: LAO 4 CAU 3  IMPRESSION: 1. Trileaflet, severely  thickened and calcified aortic valve with severely restricted leaflet opening, no calcifications are extending into the LVOT. Annular measurements suitable  for delivery of a 23 mm Edwards-SAPIEN 3 valve.  2. Sufficient coronary to annulus distance.  3. Optimum Fluoroscopic Angle for Delivery: LAO 4 CAU 3  4. No thrombus in the left atrial appendage.   Electronically Signed   By: Ena Dawley   On: 01/11/2018 18:25      CT ANGIOGRAPHY CHEST, ABDOMEN AND PELVIS  TECHNIQUE: Multidetector CT imaging through the chest, abdomen and pelvis was performed using the standard protocol during bolus administration of intravenous contrast. Multiplanar reconstructed images and MIPs were obtained and reviewed to evaluate the vascular anatomy.  CONTRAST:  100 mL of Isovue 370.  COMPARISON:  None.  FINDINGS: CTA CHEST FINDINGS  Cardiovascular: Heart size is mildly enlarged with left atrial dilatation. There is no significant pericardial fluid, thickening or pericardial calcification. There is aortic atherosclerosis, as well as atherosclerosis of the great vessels of the mediastinum and the coronary arteries, including calcified atherosclerotic plaque in the left main, left anterior descending, left circumflex and right coronary arteries. Severe thickening and calcification of the aortic valve.  Mediastinum/Lymph Nodes: No pathologically enlarged mediastinal or hilar lymph nodes. Large hiatal hernia. No axillary lymphadenopathy.  Lungs/Pleura: Cluster of peribronchovascular micronodularity in the periphery of the right upper lobe, nonspecific but statistically likely to reflect benign areas of mucoid impaction within the terminal bronchioles. No confluent consolidative airspace disease. No pleural effusions. Mosaic attenuation throughout the lungs bilaterally suggestive of a background of air trapping from small airways disease. Thin-walled cyst in the medial aspect of the right lower lobe incidentally noted.  Musculoskeletal/Soft Tissues: There are no aggressive appearing lytic or blastic lesions noted in the  visualized portions of the skeleton.  CTA ABDOMEN AND PELVIS FINDINGS  Hepatobiliary: No suspicious cystic or solid hepatic lesions. No intra or extrahepatic biliary ductal dilatation. Gallbladder is normal in appearance.  Pancreas: No pancreatic mass. No pancreatic ductal dilatation. No pancreatic or peripancreatic fluid or inflammatory changes.  Spleen: Unremarkable.  Adrenals/Urinary Tract: Diffuse mild-to-moderate cortical thinning in both kidneys. No suspicious renal lesions. No hydroureteronephrosis. Urinary bladder is normal in appearance. Bilateral adrenal glands are normal in appearance.  Stomach/Bowel: Intra-abdominal portion of the stomach is unremarkable. No pathologic dilatation of small bowel or colon. Numerous colonic diverticulae are noted, without surrounding inflammatory changes to suggest an acute diverticulitis at this time. Normal appendix.  Vascular/Lymphatic: Aortic atherosclerosis, without evidence of aneurysm or dissection in the abdominal or pelvic vasculature. Vascular findings and measurements pertinent to potential TAVR procedure, as detailed below. Celiac axis and inferior mesenteric artery are both widely patent without hemodynamically significant stenosis. Moderate stenosis of the ostium of the superior mesenteric artery. Two right and one left renal arteries are all widely patent without hemodynamically significant stenosis. No lymphadenopathy noted in the abdomen or pelvis.  Reproductive: Uterus and ovaries are unremarkable in appearance.  Other: No significant volume of ascites.  No pneumoperitoneum.  Musculoskeletal: There are no aggressive appearing lytic or blastic lesions noted in the visualized portions of the skeleton.  VASCULAR MEASUREMENTS PERTINENT TO TAVR:  AORTA:  Minimal Aortic Diameter-8 x 8 mm  Severity of Aortic Calcification-severe  RIGHT PELVIS:  Right Common Iliac Artery -  Minimal Diameter-7.0  x 6.6 mm  Tortuosity-mild  Calcification-moderate  Right External Iliac Artery -  Minimal Diameter-5.9 x 5.7 mm  Tortuosity-mild  Calcification-none  Right Common Femoral Artery -  Minimal Diameter-6.2 x 4.0 mm  Tortuosity-mild  Calcification-mild  LEFT PELVIS:  Left Common Iliac Artery -  Minimal Diameter-6.1 x 5.8 mm  Tortuosity-mild  Calcification-moderate  Left External Iliac Artery -  Minimal Diameter-5.1 x 5.3 mm  Tortuosity-mild  Calcification-none  Left Common Femoral Artery -  Minimal Diameter-6.3 x 5.3 mm  Tortuosity-mild  Calcification-mild  Review of the MIP images confirms the above findings.  IMPRESSION: 1. Vascular findings and measurements pertinent to potential TAVR procedure, as detailed above. 2. Severe thickening calcification of the aortic valve, compatible with patient's reported clinical history of severe aortic stenosis. 3. Aortic atherosclerosis, in addition to left main and 3 vessel coronary artery disease. 4. Large hiatal hernia. This may have implications at the time of upcoming TAVR procedure. 5. Cardiomegaly with mild left atrial dilatation. 6. Mild colonic diverticulosis without evidence to suggest an acute diverticulitis at this time. 7. Additional incidental findings, as above.  Aortic Atherosclerosis (ICD10-I70.0).   Electronically Signed   By: Vinnie Langton M.D.   On: 01/11/2018 13:24    Impression:  Patient has stage D severe symptomatic aortic stenosis.  She describes mild symptoms of exertional shortness of breath consistent with chronic diastolic congestive heart failure, New York Heart Association functional class I-II.  She also has recently developed occasional dizzy spells without syncope.  She denies any history of chest pain or chest tightness either with activity or at rest.  I have personally reviewed the report from her recent transthoracic echocardiogram, although  images from this examination are not currently available for review.  By report the patient has severe aortic stenosis with normal left ventricular systolic function.  I have personally reviewed the patient's recent diagnostic cardiac catheterization and CT angiograms.  Catheterization reveals a single discrete high-grade stenosis in the mid right coronary artery which was found to be flow-limiting based on FFR analysis.  There is nonobstructive coronary artery disease in the left coronary territory.  The patient is not having angina pectoris.  Right heart pressures were normal.  I agree the patient would benefit from aortic valve replacement.  Risks associated with conventional surgical aortic valve replacement with or without coronary artery bypass grafting would be moderately elevated because of the patient's age.  Cardiac-gated CTA of the heart reveals anatomical characteristics consistent with aortic stenosis suitable for treatment by transcatheter aortic valve replacement without any significant complicating features and CTA of the aorta and iliac vessels demonstrate what appears to be adequate pelvic vascular access to facilitate a transfemoral approach.  I agree that transcatheter aortic valve replacement would be reasonable alternative to to conventional surgical aortic valve replacement with plans for continued medical therapy and follow-up related to the patient's underlying coronary artery disease.  Right coronary artery stenosis could be treated with PCI and stenting if the patient were to develop symptoms.    Plan:  The patient was counseled at length regarding treatment alternatives for management of severe symptomatic aortic stenosis. Alternative approaches such as conventional aortic valve replacement with coronary artery bypass grafting, transcatheter aortic valve replacement, and continued medical therapy without intervention were compared and contrasted at length.  The risks associated with  conventional surgical aortic valve replacement were discussed in detail, as were expectations for post-operative convalescence.  Issues specific to transcatheter aortic valve replacement were discussed including questions about long term valve durability, the potential for paravalvular leak, possible increased risk of need for permanent pacemaker placement, and other technical complications related to the procedure itself.  Long-term prognosis with medical therapy was discussed. This discussion  was placed in the context of the patient's own specific clinical presentation and past medical history.  All of her questions have been addressed.  The patient desires to proceed with transcatheter aortic valve replacement as soon as practical.  We tentatively plan for surgery on February 15, 2018.  Following the decision to proceed with transcatheter aortic valve replacement, a discussion has been held regarding what types of management strategies would be attempted intraoperatively in the event of life-threatening complications, including whether or not the patient would be considered a candidate for the use of cardiopulmonary bypass and/or conversion to open sternotomy for attempted surgical intervention.  The patient has been advised of a variety of complications that might develop including but not limited to risks of death, stroke, paravalvular leak, aortic dissection or other major vascular complications, aortic annulus rupture, device embolization, cardiac rupture or perforation, mitral regurgitation, acute myocardial infarction, arrhythmia, heart block or bradycardia requiring permanent pacemaker placement, congestive heart failure, respiratory failure, renal failure, pneumonia, infection, other late complications related to structural valve deterioration or migration, or other complications that might ultimately cause a temporary or permanent loss of functional independence or other long term morbidity.  The patient  provides full informed consent for the procedure as described and all questions were answered.   I spent in excess of 90 minutes during the conduct of this office consultation and >50% of this time involved direct face-to-face encounter with the patient for counseling and/or coordination of their care.     Valentina Gu. Roxy Manns, MD 02/09/2018 3:04 PM

## 2018-02-10 ENCOUNTER — Other Ambulatory Visit: Payer: Self-pay

## 2018-02-10 DIAGNOSIS — I350 Nonrheumatic aortic (valve) stenosis: Secondary | ICD-10-CM

## 2018-02-11 ENCOUNTER — Other Ambulatory Visit: Payer: Self-pay

## 2018-02-11 ENCOUNTER — Encounter (HOSPITAL_COMMUNITY)
Admission: RE | Admit: 2018-02-11 | Discharge: 2018-02-11 | Disposition: A | Discharge status: Home / Self Care | Payer: Medicare Other | Source: Ambulatory Visit | Attending: Cardiovascular Disease | Admitting: Cardiovascular Disease

## 2018-02-11 ENCOUNTER — Ambulatory Visit (HOSPITAL_COMMUNITY)
Admission: RE | Admit: 2018-02-11 | Discharge: 2018-02-11 | Disposition: A | Discharge status: Home / Self Care | Payer: Medicare Other | Source: Ambulatory Visit | Attending: Cardiovascular Disease | Admitting: Cardiovascular Disease

## 2018-02-11 ENCOUNTER — Encounter (HOSPITAL_COMMUNITY): Payer: Self-pay

## 2018-02-11 DIAGNOSIS — I350 Nonrheumatic aortic (valve) stenosis: Secondary | ICD-10-CM | POA: Insufficient documentation

## 2018-02-11 DIAGNOSIS — I517 Cardiomegaly: Secondary | ICD-10-CM | POA: Insufficient documentation

## 2018-02-11 DIAGNOSIS — Z01812 Encounter for preprocedural laboratory examination: Secondary | ICD-10-CM | POA: Diagnosis not present

## 2018-02-11 DIAGNOSIS — I700 Atherosclerosis of aorta: Secondary | ICD-10-CM | POA: Insufficient documentation

## 2018-02-11 DIAGNOSIS — Z01810 Encounter for preprocedural cardiovascular examination: Secondary | ICD-10-CM | POA: Diagnosis not present

## 2018-02-11 DIAGNOSIS — R911 Solitary pulmonary nodule: Secondary | ICD-10-CM | POA: Diagnosis not present

## 2018-02-11 DIAGNOSIS — Z01818 Encounter for other preprocedural examination: Secondary | ICD-10-CM | POA: Diagnosis not present

## 2018-02-11 HISTORY — DX: Nonrheumatic aortic (valve) stenosis: I35.0

## 2018-02-11 LAB — COMPREHENSIVE METABOLIC PANEL
ALT: 8 U/L — ABNORMAL LOW (ref 14–54)
AST: 19 U/L (ref 15–41)
Albumin: 4 g/dL (ref 3.5–5.0)
Alkaline Phosphatase: 42 U/L (ref 38–126)
Anion gap: 11 (ref 5–15)
BUN: 19 mg/dL (ref 6–20)
CO2: 18 mmol/L — ABNORMAL LOW (ref 22–32)
Calcium: 8.5 mg/dL — ABNORMAL LOW (ref 8.9–10.3)
Chloride: 111 mmol/L (ref 101–111)
Creatinine, Ser: 0.86 mg/dL (ref 0.44–1.00)
GFR calc Af Amer: >60 mL/min (ref >60)
GFR calc non Af Amer: >60 mL/min (ref >60)
Glucose, Bld: 98 mg/dL (ref 65–99)
Potassium: 4.7 mmol/L (ref 3.5–5.1)
Sodium: 140 mmol/L (ref 135–145)
Total Bilirubin: 0.6 mg/dL (ref 0.3–1.2)
Total Protein: 6.6 g/dL (ref 6.5–8.1)

## 2018-02-11 LAB — CBC
HCT: 36 % (ref 36.0–46.0)
Hemoglobin: 11.2 g/dL — ABNORMAL LOW (ref 12.0–15.0)
MCH: 30.4 pg (ref 26.0–34.0)
MCHC: 31.1 g/dL (ref 30.0–36.0)
MCV: 97.6 fL (ref 78.0–100.0)
Platelets: 139 10*3/uL — ABNORMAL LOW (ref 150–400)
RBC: 3.69 MIL/uL — ABNORMAL LOW (ref 3.87–5.11)
RDW: 13.8 % (ref 11.5–15.5)
WBC: 7.2 10*3/uL (ref 4.0–10.5)

## 2018-02-11 LAB — ABO/RH: ABO/RH(D): A POS

## 2018-02-11 LAB — URINALYSIS, ROUTINE W REFLEX MICROSCOPIC
Bilirubin Urine: NEGATIVE
Glucose, UA: NEGATIVE mg/dL
Hgb urine dipstick: NEGATIVE
Ketones, ur: NEGATIVE mg/dL
Leukocytes, UA: NEGATIVE
Nitrite: NEGATIVE
Protein, ur: NEGATIVE mg/dL
Specific Gravity, Urine: 1.008 (ref 1.005–1.030)
pH: 6 (ref 5.0–8.0)

## 2018-02-11 LAB — BLOOD GAS, ARTERIAL
Acid-base deficit: 2.7 mmol/L — ABNORMAL HIGH (ref 0.0–2.0)
Bicarbonate: 21.5 mmol/L (ref 20.0–28.0)
Drawn by: 421801
FIO2: 21
O2 Saturation: 96.4 %
Patient temperature: 98.6
pCO2 arterial: 36.5 mmHg (ref 32.0–48.0)
pH, Arterial: 7.387 (ref 7.350–7.450)
pO2, Arterial: 127 mmHg — ABNORMAL HIGH (ref 83.0–108.0)

## 2018-02-11 LAB — SURGICAL PCR SCREEN
MRSA, PCR: NEGATIVE
Staphylococcus aureus: NEGATIVE

## 2018-02-11 LAB — TYPE AND SCREEN
ABO/RH(D): A POS
Antibody Screen: NEGATIVE

## 2018-02-11 LAB — HEMOGLOBIN A1C
Hgb A1c MFr Bld: 6.7 % — ABNORMAL HIGH (ref 4.8–5.6)
Mean Plasma Glucose: 145.59 mg/dL

## 2018-02-11 LAB — BRAIN NATRIURETIC PEPTIDE: B Natriuretic Peptide: 92.3 pg/mL (ref 0.0–100.0)

## 2018-02-11 LAB — GLUCOSE, CAPILLARY: Glucose-Capillary: 75 mg/dL (ref 65–99)

## 2018-02-11 LAB — APTT: aPTT: 30 seconds (ref 24–36)

## 2018-02-11 LAB — PROTIME-INR
INR: 0.98
Prothrombin Time: 12.9 seconds (ref 11.4–15.2)

## 2018-02-11 NOTE — Progress Notes (Signed)
PCP: Jani Gravel, MD  Cardiologist: Adrian Prows, MD  EKG: obtained today  Stress test: 04/13/13 in EPIC  ECHO: 03/02/13  Cardiac Cath: 12/24/17 in EPIC  Chest x-ray: obtained today

## 2018-02-11 NOTE — Pre-Procedure Instructions (Addendum)
Mallory Thomas [facility]  02/11/2018      [id number] - [place], [place] [address] AT [address] [place] [address] Phone: [phone] Fax: [phone]    Your procedure is scheduled on February 15, 2018.  Report to [facility] Admitting at 1030 AM.  Call this number if you have problems the morning of surgery:  [phone]   Remember:  Do not eat food or drink liquids after midnight.  Take these medicines the morning of surgery with A SIP OF WATER (none).                  Follow your doctor's instructions on taking aspirin.             7 days prior to surgery STOP taking any Aleve, Naproxen, Ibuprofen, Motrin, Advil, Goody's, BC's, all herbal medications, fish oil, and all vitamins  Continue all other medications as instructed by your physician except follow the above medication instructions before surgery  WHAT DO I DO ABOUT MY DIABETES MEDICATION?   Marland Kitchen Do not take oral diabetes medicines (pills) the morning of surgery onglyza.   How to Manage Your Diabetes Before and After Surgery  Why is it important to control my blood sugar before and after surgery? . Improving blood sugar levels before and after surgery helps healing and can limit problems. . A way of improving blood sugar control is eating a healthy diet by: o  Eating less sugar and carbohydrates o  Increasing activity/exercise o  Talking with your doctor about reaching your blood sugar goals . High blood sugars (greater than 180 mg/dL) can raise your risk of infections and slow your recovery, so you will need to focus on controlling your diabetes during the weeks before surgery. . Make sure that the doctor who takes care of your diabetes knows about your planned surgery including the date and location.  How do I manage my blood sugar before surgery? . Check your blood sugar at least 4 times a day, starting 2 days before surgery, to make sure  that the level is not too high or low. o Check your blood sugar the morning of your surgery when you wake up and every 2 hours until you get to the Short Stay unit. . If your blood sugar is less than 70 mg/dL, you will need to treat for low blood sugar: o Do not take insulin. o Treat a low blood sugar (less than 70 mg/dL) with  cup of clear juice (cranberry or apple), 4 glucose tablets, OR glucose gel. Recheck blood sugar in 15 minutes after treatment (to make sure it is greater than 70 mg/dL). If your blood sugar is not greater than 70 mg/dL on recheck, call [phone] o  for further instructions. . Report your blood sugar to the short stay nurse when you get to Short Stay.  . If you are admitted to the hospital after surgery: o Your blood sugar will be checked by the staff and you will probably be given insulin after surgery (instead of oral diabetes medicines) to make sure you have good blood sugar levels. o The goal for blood sugar control after surgery is 80-180 mg/dL.   Reviewed and Endorsed by [facility] Patient Education Committee, August 2015  Do not wear jewelry, make-up or nail polish.  Do not wear lotions, powders, or perfumes, or deodorant.  Do not shave 48 hours prior to surgery.  Do not bring valuables to the hospital.  [facility] is not responsible for any belongings or valuables.  Contacts, dentures or bridgework may not be worn into surgery.  Leave your suitcase in the car.  After surgery it may be brought to your room.  For patients admitted to the hospital, discharge time will be determined by your treatment team.  Patients discharged the day of surgery will not be allowed to drive home.    [facility]- Preparing For Surgery  Before surgery, you can play an important role. Because skin is not sterile, your skin needs to be as free of germs as possible. You can reduce the number of germs on your skin by washing with CHG (chlorahexidine gluconate) Soap before  surgery.  CHG is an antiseptic cleaner which kills germs and bonds with the skin to continue killing germs even after washing.  Please do not use if you have an allergy to CHG or antibacterial soaps. If your skin becomes reddened/irritated stop using the CHG.  Do not shave (including legs and underarms) for at least 48 hours prior to first CHG shower. It is OK to shave your face.  Please follow these instructions carefully.   1. Shower the NIGHT BEFORE SURGERY and the MORNING OF SURGERY with CHG.   2. If you chose to wash your hair, wash your hair first as usual with your normal shampoo.  3. After you shampoo, rinse your hair and body thoroughly to remove the shampoo.  4. Use CHG as you would any other liquid soap. You can apply CHG directly to the skin and wash gently with a scrungie or a clean washcloth.   5. Apply the CHG Soap to your body ONLY FROM THE NECK DOWN.  Do not use on open wounds or open sores. Avoid contact with your eyes, ears, mouth and genitals (private parts). Wash Face and genitals (private parts)  with your normal soap.  6. Wash thoroughly, paying special attention to the area where your surgery will be performed.  7. Thoroughly rinse your body with warm water from the neck down.  8. DO NOT shower/wash with your normal soap after using and rinsing off the CHG Soap.  9. Pat yourself dry with a CLEAN TOWEL.  10. Wear CLEAN PAJAMAS to bed the night before surgery, wear comfortable clothes the morning of surgery  11. Place CLEAN SHEETS on your bed the night of your first shower and DO NOT SLEEP WITH PETS.  Day of Surgery: Do not apply any deodorants/lotions. Please wear clean clothes to the hospital/surgery center.    Please read over the following fact sheets that you were given. Pain Booklet, Coughing and Deep Breathing, MRSA Information and Surgical Site Infection Prevention

## 2018-02-14 MED ORDER — DEXMEDETOMIDINE HCL IN NACL 400 MCG/100ML IV SOLN
0.1000 ug/kg/h | INTRAVENOUS | Status: AC
  Administered 2018-02-15: 1 ug/kg/h via INTRAVENOUS
  Filled 2018-02-14: qty 100

## 2018-02-14 MED ORDER — VANCOMYCIN HCL 10 G IV SOLR
1500.0000 mg | INTRAVENOUS | Status: AC
  Administered 2018-02-15: 1500 mg via INTRAVENOUS
  Filled 2018-02-14: qty 1500

## 2018-02-14 MED ORDER — NOREPINEPHRINE BITARTRATE 1 MG/ML IV SOLN
0.0000 ug/min | INTRAVENOUS | Status: AC
  Administered 2018-02-15: 1 ug/min via INTRAVENOUS
  Filled 2018-02-14: qty 4

## 2018-02-14 MED ORDER — NITROGLYCERIN IN D5W 200-5 MCG/ML-% IV SOLN
2.0000 ug/min | INTRAVENOUS | Status: DC
  Filled 2018-02-14: qty 250

## 2018-02-14 MED ORDER — MAGNESIUM SULFATE 50 % IJ SOLN
40.0000 meq | INTRAMUSCULAR | Status: DC
  Filled 2018-02-14: qty 9.85

## 2018-02-14 MED ORDER — DOPAMINE-DEXTROSE 3.2-5 MG/ML-% IV SOLN
0.0000 ug/kg/min | INTRAVENOUS | Status: DC
  Filled 2018-02-14: qty 250

## 2018-02-14 MED ORDER — DEXTROSE 5 % IV SOLN
0.0000 ug/min | INTRAVENOUS | Status: DC
  Filled 2018-02-14: qty 4

## 2018-02-14 MED ORDER — SODIUM CHLORIDE 0.9 % IV SOLN
INTRAVENOUS | Status: DC
  Filled 2018-02-14: qty 1

## 2018-02-14 MED ORDER — SODIUM CHLORIDE 0.9 % IV SOLN
1.5000 g | INTRAVENOUS | Status: AC
  Administered 2018-02-15: 1.5 g via INTRAVENOUS
  Filled 2018-02-14: qty 1.5

## 2018-02-14 MED ORDER — PHENYLEPHRINE HCL 10 MG/ML IJ SOLN
30.0000 ug/min | INTRAMUSCULAR | Status: DC
  Filled 2018-02-14: qty 2

## 2018-02-14 MED ORDER — SODIUM CHLORIDE 0.9 % IV SOLN
INTRAVENOUS | Status: DC
  Filled 2018-02-14: qty 30

## 2018-02-14 MED ORDER — POTASSIUM CHLORIDE 2 MEQ/ML IV SOLN
80.0000 meq | INTRAVENOUS | Status: DC
  Filled 2018-02-14: qty 40

## 2018-02-15 ENCOUNTER — Inpatient Hospital Stay (HOSPITAL_COMMUNITY): Payer: Medicare Other

## 2018-02-15 ENCOUNTER — Inpatient Hospital Stay (HOSPITAL_COMMUNITY): Payer: Medicare Other | Admitting: Anesthesiology

## 2018-02-15 ENCOUNTER — Inpatient Hospital Stay (HOSPITAL_COMMUNITY): Payer: Medicare Other | Admitting: Emergency Medicine

## 2018-02-15 ENCOUNTER — Other Ambulatory Visit: Payer: Self-pay

## 2018-02-15 ENCOUNTER — Encounter (HOSPITAL_COMMUNITY): Payer: Self-pay | Admitting: Surgery

## 2018-02-15 ENCOUNTER — Inpatient Hospital Stay (HOSPITAL_COMMUNITY)
Admission: RE | Admit: 2018-02-15 | Discharge: 2018-02-17 | DRG: 267 | Disposition: A | Discharge status: Home / Self Care | Payer: Medicare Other | Source: Ambulatory Visit | Attending: Cardiovascular Disease | Admitting: Cardiovascular Disease

## 2018-02-15 ENCOUNTER — Encounter (HOSPITAL_COMMUNITY)
Admission: RE | Disposition: A | Discharge status: Home / Self Care | Payer: Self-pay | Source: Ambulatory Visit | Attending: Cardiovascular Disease

## 2018-02-15 DIAGNOSIS — I700 Atherosclerosis of aorta: Secondary | ICD-10-CM | POA: Diagnosis present

## 2018-02-15 DIAGNOSIS — Z7984 Long term (current) use of oral hypoglycemic drugs: Secondary | ICD-10-CM | POA: Diagnosis not present

## 2018-02-15 DIAGNOSIS — Z6834 Body mass index (BMI) 34.0-34.9, adult: Secondary | ICD-10-CM

## 2018-02-15 DIAGNOSIS — E1151 Type 2 diabetes mellitus with diabetic peripheral angiopathy without gangrene: Secondary | ICD-10-CM | POA: Diagnosis present

## 2018-02-15 DIAGNOSIS — I340 Nonrheumatic mitral (valve) insufficiency: Secondary | ICD-10-CM | POA: Diagnosis not present

## 2018-02-15 DIAGNOSIS — E119 Type 2 diabetes mellitus without complications: Secondary | ICD-10-CM

## 2018-02-15 DIAGNOSIS — Z006 Encounter for examination for normal comparison and control in clinical research program: Secondary | ICD-10-CM

## 2018-02-15 DIAGNOSIS — E669 Obesity, unspecified: Secondary | ICD-10-CM | POA: Diagnosis present

## 2018-02-15 DIAGNOSIS — N183 Chronic kidney disease, stage 3 unspecified: Secondary | ICD-10-CM | POA: Diagnosis present

## 2018-02-15 DIAGNOSIS — I129 Hypertensive chronic kidney disease with stage 1 through stage 4 chronic kidney disease, or unspecified chronic kidney disease: Secondary | ICD-10-CM | POA: Diagnosis present

## 2018-02-15 DIAGNOSIS — Z853 Personal history of malignant neoplasm of breast: Secondary | ICD-10-CM

## 2018-02-15 DIAGNOSIS — Z923 Personal history of irradiation: Secondary | ICD-10-CM | POA: Diagnosis not present

## 2018-02-15 DIAGNOSIS — Z952 Presence of prosthetic heart valve: Secondary | ICD-10-CM

## 2018-02-15 DIAGNOSIS — Z7982 Long term (current) use of aspirin: Secondary | ICD-10-CM | POA: Diagnosis not present

## 2018-02-15 DIAGNOSIS — D62 Acute posthemorrhagic anemia: Secondary | ICD-10-CM | POA: Diagnosis not present

## 2018-02-15 DIAGNOSIS — E1122 Type 2 diabetes mellitus with diabetic chronic kidney disease: Secondary | ICD-10-CM | POA: Diagnosis not present

## 2018-02-15 DIAGNOSIS — Z87891 Personal history of nicotine dependence: Secondary | ICD-10-CM | POA: Diagnosis not present

## 2018-02-15 DIAGNOSIS — Z79899 Other long term (current) drug therapy: Secondary | ICD-10-CM | POA: Diagnosis not present

## 2018-02-15 DIAGNOSIS — I10 Essential (primary) hypertension: Secondary | ICD-10-CM | POA: Diagnosis present

## 2018-02-15 DIAGNOSIS — I25119 Atherosclerotic heart disease of native coronary artery with unspecified angina pectoris: Secondary | ICD-10-CM | POA: Diagnosis present

## 2018-02-15 DIAGNOSIS — I25118 Atherosclerotic heart disease of native coronary artery with other forms of angina pectoris: Secondary | ICD-10-CM | POA: Diagnosis present

## 2018-02-15 DIAGNOSIS — I350 Nonrheumatic aortic (valve) stenosis: Principal | ICD-10-CM | POA: Diagnosis present

## 2018-02-15 DIAGNOSIS — I272 Pulmonary hypertension, unspecified: Secondary | ICD-10-CM | POA: Diagnosis present

## 2018-02-15 DIAGNOSIS — E785 Hyperlipidemia, unspecified: Secondary | ICD-10-CM | POA: Diagnosis present

## 2018-02-15 DIAGNOSIS — D6959 Other secondary thrombocytopenia: Secondary | ICD-10-CM | POA: Diagnosis present

## 2018-02-15 HISTORY — PX: TEE WITHOUT CARDIOVERSION: SHX5443

## 2018-02-15 HISTORY — DX: Presence of prosthetic heart valve: Z95.2

## 2018-02-15 HISTORY — PX: TRANSCATHETER AORTIC VALVE REPLACEMENT, TRANSFEMORAL: SHX6400

## 2018-02-15 LAB — POCT I-STAT, CHEM 8
BUN: 22 mg/dL — ABNORMAL HIGH (ref 6–20)
BUN: 21 mg/dL — ABNORMAL HIGH (ref 6–20)
BUN: 21 mg/dL — ABNORMAL HIGH (ref 6–20)
Calcium, Ion: 1.27 mmol/L (ref 1.15–1.40)
Calcium, Ion: 1.25 mmol/L (ref 1.15–1.40)
Calcium, Ion: 1.27 mmol/L (ref 1.15–1.40)
Chloride: 112 mmol/L — ABNORMAL HIGH (ref 101–111)
Chloride: 110 mmol/L (ref 101–111)
Chloride: 110 mmol/L (ref 101–111)
Creatinine, Ser: 0.8 mg/dL (ref 0.44–1.00)
Creatinine, Ser: 0.8 mg/dL (ref 0.44–1.00)
Creatinine, Ser: 0.8 mg/dL (ref 0.44–1.00)
Glucose, Bld: 109 mg/dL — ABNORMAL HIGH (ref 65–99)
Glucose, Bld: 154 mg/dL — ABNORMAL HIGH (ref 65–99)
Glucose, Bld: 178 mg/dL — ABNORMAL HIGH (ref 65–99)
HCT: 30 % — ABNORMAL LOW (ref 36.0–46.0)
HCT: 27 % — ABNORMAL LOW (ref 36.0–46.0)
HCT: 27 % — ABNORMAL LOW (ref 36.0–46.0)
Hemoglobin: 10.2 g/dL — ABNORMAL LOW (ref 12.0–15.0)
Hemoglobin: 9.2 g/dL — ABNORMAL LOW (ref 12.0–15.0)
Hemoglobin: 9.2 g/dL — ABNORMAL LOW (ref 12.0–15.0)
Potassium: 4.2 mmol/L (ref 3.5–5.1)
Potassium: 4.3 mmol/L (ref 3.5–5.1)
Potassium: 4.4 mmol/L (ref 3.5–5.1)
Sodium: 144 mmol/L (ref 135–145)
Sodium: 143 mmol/L (ref 135–145)
Sodium: 144 mmol/L (ref 135–145)
TCO2: 23 mmol/L (ref 22–32)
TCO2: 22 mmol/L (ref 22–32)
TCO2: 23 mmol/L (ref 22–32)

## 2018-02-15 LAB — GLUCOSE, CAPILLARY
Glucose-Capillary: 110 mg/dL — ABNORMAL HIGH (ref 65–99)
Glucose-Capillary: 109 mg/dL — ABNORMAL HIGH (ref 65–99)

## 2018-02-15 SURGERY — IMPLANTATION, AORTIC VALVE, TRANSCATHETER, FEMORAL APPROACH
Anesthesia: Monitor Anesthesia Care | Site: Chest

## 2018-02-15 MED ORDER — FENTANYL CITRATE (PF) 100 MCG/2ML IJ SOLN
50.0000 ug | Freq: Once | INTRAMUSCULAR | Status: AC
  Administered 2018-02-15: 50 ug via INTRAVENOUS

## 2018-02-15 MED ORDER — ATORVASTATIN CALCIUM 80 MG PO TABS
80.0000 mg | ORAL_TABLET | Freq: Every day | ORAL | Status: DC
  Administered 2018-02-16: 80 mg via ORAL
  Filled 2018-02-15 (×2): qty 1

## 2018-02-15 MED ORDER — CHLORHEXIDINE GLUCONATE 0.12 % MT SOLN
15.0000 mL | OROMUCOSAL | Status: AC
  Administered 2018-02-15: 15 mL via OROMUCOSAL

## 2018-02-15 MED ORDER — SODIUM CHLORIDE 0.9 % IV SOLN
INTRAVENOUS | Status: DC | PRN
  Administered 2018-02-15: 1500 mL

## 2018-02-15 MED ORDER — SODIUM CHLORIDE 0.9 % IJ SOLN
INTRAMUSCULAR | Status: AC
  Filled 2018-02-15: qty 10

## 2018-02-15 MED ORDER — CLOPIDOGREL BISULFATE 75 MG PO TABS
75.0000 mg | ORAL_TABLET | Freq: Every day | ORAL | Status: DC
  Administered 2018-02-16 – 2018-02-17 (×2): 75 mg via ORAL
  Filled 2018-02-15 (×2): qty 1

## 2018-02-15 MED ORDER — PROTAMINE SULFATE 10 MG/ML IV SOLN
INTRAVENOUS | Status: AC
  Filled 2018-02-15: qty 25

## 2018-02-15 MED ORDER — LIDOCAINE HCL (CARDIAC) 20 MG/ML IV SOLN
INTRAVENOUS | Status: AC
  Filled 2018-02-15: qty 5

## 2018-02-15 MED ORDER — PHENYLEPHRINE 40 MCG/ML (10ML) SYRINGE FOR IV PUSH (FOR BLOOD PRESSURE SUPPORT)
PREFILLED_SYRINGE | INTRAVENOUS | Status: AC
  Filled 2018-02-15: qty 10

## 2018-02-15 MED ORDER — PROPOFOL 10 MG/ML IV BOLUS
INTRAVENOUS | Status: AC
  Filled 2018-02-15: qty 20

## 2018-02-15 MED ORDER — OXYCODONE HCL 5 MG PO TABS: 5.0000 mg | ORAL_TABLET | ORAL | Status: DC | PRN

## 2018-02-15 MED ORDER — ASPIRIN 81 MG PO TABS: 81.0000 mg | ORAL_TABLET | Freq: Every day | ORAL | Status: DC

## 2018-02-15 MED ORDER — ONDANSETRON HCL 4 MG/2ML IJ SOLN
INTRAMUSCULAR | Status: DC | PRN
  Administered 2018-02-15: 4 mg via INTRAVENOUS

## 2018-02-15 MED ORDER — NOREPINEPHRINE BITARTRATE 1 MG/ML IV SOLN
0.0000 ug/min | INTRAVENOUS | Status: DC
  Filled 2018-02-15: qty 4

## 2018-02-15 MED ORDER — NITROGLYCERIN IN D5W 200-5 MCG/ML-% IV SOLN: 0.0000 ug/min | INTRAVENOUS | Status: DC

## 2018-02-15 MED ORDER — PHENYLEPHRINE 40 MCG/ML (10ML) SYRINGE FOR IV PUSH (FOR BLOOD PRESSURE SUPPORT)
PREFILLED_SYRINGE | INTRAVENOUS | Status: AC
Start: 2018-02-15 — End: ?
  Filled 2018-02-15: qty 10

## 2018-02-15 MED ORDER — COLESTIPOL HCL 1 G PO TABS
1.0000 g | ORAL_TABLET | Freq: Every day | ORAL | Status: DC
  Administered 2018-02-16 – 2018-02-17 (×2): 1 g via ORAL
  Filled 2018-02-15 (×2): qty 1

## 2018-02-15 MED ORDER — SODIUM CHLORIDE 0.9 % IV SOLN
1.0000 mL/kg/h | INTRAVENOUS | Status: AC
  Administered 2018-02-15: 1 mL/kg/h via INTRAVENOUS

## 2018-02-15 MED ORDER — LIDOCAINE HCL (PF) 1 % IJ SOLN
INTRAMUSCULAR | Status: DC | PRN
  Administered 2018-02-15: 5 mL

## 2018-02-15 MED ORDER — HEPARIN SODIUM (PORCINE) 1000 UNIT/ML IJ SOLN
INTRAMUSCULAR | Status: AC
  Filled 2018-02-15: qty 3

## 2018-02-15 MED ORDER — FENTANYL CITRATE (PF) 250 MCG/5ML IJ SOLN
INTRAMUSCULAR | Status: AC
  Filled 2018-02-15: qty 5

## 2018-02-15 MED ORDER — LINAGLIPTIN 5 MG PO TABS
5.0000 mg | ORAL_TABLET | Freq: Every day | ORAL | Status: DC
  Administered 2018-02-16 – 2018-02-17 (×2): 5 mg via ORAL
  Filled 2018-02-15 (×2): qty 1

## 2018-02-15 MED ORDER — ASPIRIN EC 81 MG PO TBEC
81.0000 mg | DELAYED_RELEASE_TABLET | Freq: Every day | ORAL | Status: DC
  Administered 2018-02-16: 81 mg via ORAL
  Filled 2018-02-15 (×2): qty 1

## 2018-02-15 MED ORDER — SODIUM CHLORIDE 0.9% FLUSH
3.0000 mL | Freq: Two times a day (BID) | INTRAVENOUS | Status: DC
  Administered 2018-02-15 – 2018-02-16 (×2): 3 mL via INTRAVENOUS

## 2018-02-15 MED ORDER — SODIUM CHLORIDE 0.9% FLUSH: 3.0000 mL | INTRAVENOUS | Status: DC | PRN

## 2018-02-15 MED ORDER — CHLORHEXIDINE GLUCONATE 0.12 % MT SOLN
15.0000 mL | Freq: Once | OROMUCOSAL | Status: AC
  Administered 2018-02-15: 15 mL via OROMUCOSAL
  Filled 2018-02-15: qty 15

## 2018-02-15 MED ORDER — SODIUM CHLORIDE 0.9 % IV SOLN: INTRAVENOUS | Status: DC

## 2018-02-15 MED ORDER — 0.9 % SODIUM CHLORIDE (POUR BTL) OPTIME
TOPICAL | Status: DC | PRN
Start: 2018-02-15 — End: 2018-02-15
  Administered 2018-02-15: 2000 mL

## 2018-02-15 MED ORDER — PROPOFOL 10 MG/ML IV BOLUS
INTRAVENOUS | Status: DC | PRN
  Administered 2018-02-15 (×2): 20 mg via INTRAVENOUS

## 2018-02-15 MED ORDER — IODIXANOL 320 MG/ML IV SOLN
INTRAVENOUS | Status: DC | PRN
  Administered 2018-02-15: 39.8 mL via INTRAVENOUS

## 2018-02-15 MED ORDER — PROTAMINE SULFATE 10 MG/ML IV SOLN
INTRAVENOUS | Status: DC | PRN
  Administered 2018-02-15: 80 mg via INTRAVENOUS

## 2018-02-15 MED ORDER — INSULIN ASPART 100 UNIT/ML ~~LOC~~ SOLN
0.0000 [IU] | Freq: Three times a day (TID) | SUBCUTANEOUS | Status: DC
  Administered 2018-02-15: 3 [IU] via SUBCUTANEOUS
  Administered 2018-02-16 – 2018-02-17 (×2): 2 [IU] via SUBCUTANEOUS

## 2018-02-15 MED ORDER — PROTAMINE SULFATE 10 MG/ML IV SOLN
INTRAVENOUS | Status: AC
  Filled 2018-02-15: qty 20

## 2018-02-15 MED ORDER — LACTATED RINGERS IV SOLN: 500.0000 mL | Freq: Once | INTRAVENOUS | Status: DC | PRN

## 2018-02-15 MED ORDER — METOPROLOL TARTRATE 5 MG/5ML IV SOLN: 2.5000 mg | INTRAVENOUS | Status: DC | PRN

## 2018-02-15 MED ORDER — FAMOTIDINE IN NACL 20-0.9 MG/50ML-% IV SOLN: 20.0000 mg | Freq: Two times a day (BID) | INTRAVENOUS | Status: DC

## 2018-02-15 MED ORDER — CHLORHEXIDINE GLUCONATE 4 % EX LIQD: 30.0000 mL | CUTANEOUS | Status: DC

## 2018-02-15 MED ORDER — MIDAZOLAM HCL 2 MG/2ML IJ SOLN: 2.0000 mg | INTRAMUSCULAR | Status: DC | PRN

## 2018-02-15 MED ORDER — CHLORHEXIDINE GLUCONATE 4 % EX LIQD: 60.0000 mL | Freq: Once | CUTANEOUS | Status: DC

## 2018-02-15 MED ORDER — MIDAZOLAM HCL 2 MG/2ML IJ SOLN
INTRAMUSCULAR | Status: AC
  Filled 2018-02-15: qty 2

## 2018-02-15 MED ORDER — LACTATED RINGERS IV SOLN
INTRAVENOUS | Status: DC
  Administered 2018-02-15 (×2): via INTRAVENOUS

## 2018-02-15 MED ORDER — SODIUM CHLORIDE 0.9 % IV SOLN
INTRAVENOUS | Status: AC
  Filled 2018-02-15 (×3): qty 1.2

## 2018-02-15 MED ORDER — METOPROLOL TARTRATE 25 MG/10 ML ORAL SUSPENSION: 12.5000 mg | Freq: Two times a day (BID) | ORAL | Status: DC

## 2018-02-15 MED ORDER — LIDOCAINE HCL (PF) 1 % IJ SOLN
INTRAMUSCULAR | Status: AC
  Filled 2018-02-15: qty 30

## 2018-02-15 MED ORDER — HEPARIN SODIUM (PORCINE) 1000 UNIT/ML IJ SOLN
INTRAMUSCULAR | Status: DC | PRN
  Administered 2018-02-15: 8000 [IU] via INTRAVENOUS

## 2018-02-15 MED ORDER — VANCOMYCIN HCL IN DEXTROSE 1-5 GM/200ML-% IV SOLN
1000.0000 mg | Freq: Once | INTRAVENOUS | Status: AC
  Administered 2018-02-15: 1000 mg via INTRAVENOUS
  Filled 2018-02-15: qty 200

## 2018-02-15 MED ORDER — ONDANSETRON HCL 4 MG/2ML IJ SOLN
INTRAMUSCULAR | Status: AC
  Filled 2018-02-15: qty 2

## 2018-02-15 MED ORDER — ALBUMIN HUMAN 5 % IV SOLN
250.0000 mL | INTRAVENOUS | Status: DC | PRN
  Filled 2018-02-15: qty 250

## 2018-02-15 MED ORDER — PANTOPRAZOLE SODIUM 40 MG PO TBEC
40.0000 mg | DELAYED_RELEASE_TABLET | Freq: Every day | ORAL | Status: DC
  Filled 2018-02-15: qty 1

## 2018-02-15 MED ORDER — METOPROLOL TARTRATE 12.5 MG HALF TABLET
12.5000 mg | ORAL_TABLET | Freq: Two times a day (BID) | ORAL | Status: DC
  Administered 2018-02-15 – 2018-02-16 (×2): 12.5 mg via ORAL
  Filled 2018-02-15 (×2): qty 1

## 2018-02-15 MED ORDER — TRAMADOL HCL 50 MG PO TABS
50.0000 mg | ORAL_TABLET | ORAL | Status: DC | PRN
  Filled 2018-02-15: qty 1

## 2018-02-15 MED ORDER — CEFAZOLIN SODIUM-DEXTROSE 2-4 GM/100ML-% IV SOLN
2.0000 g | Freq: Three times a day (TID) | INTRAVENOUS | Status: DC
  Administered 2018-02-15 – 2018-02-17 (×5): 2 g via INTRAVENOUS
  Filled 2018-02-15 (×7): qty 100

## 2018-02-15 MED ORDER — FENTANYL CITRATE (PF) 100 MCG/2ML IJ SOLN
INTRAMUSCULAR | Status: AC
  Administered 2018-02-15: 50 ug via INTRAVENOUS
  Filled 2018-02-15: qty 2

## 2018-02-15 MED ORDER — MORPHINE SULFATE (PF) 2 MG/ML IV SOLN: 2.0000 mg | INTRAVENOUS | Status: DC | PRN

## 2018-02-15 MED ORDER — ONDANSETRON HCL 4 MG/2ML IJ SOLN
4.0000 mg | Freq: Four times a day (QID) | INTRAMUSCULAR | Status: DC | PRN
  Administered 2018-02-15: 4 mg via INTRAVENOUS
  Filled 2018-02-15: qty 2

## 2018-02-15 MED ORDER — SODIUM CHLORIDE 0.9 % IV SOLN
250.0000 mL | INTRAVENOUS | Status: DC | PRN
  Administered 2018-02-16: 250 mL via INTRAVENOUS

## 2018-02-15 MED ORDER — DEXMEDETOMIDINE HCL 200 MCG/2ML IV SOLN
INTRAVENOUS | Status: DC | PRN
  Administered 2018-02-15: 83.8 ug via INTRAVENOUS

## 2018-02-15 SURGICAL SUPPLY — 90 items
ADH SKN CLS APL DERMABOND .7 (GAUZE/BANDAGES/DRESSINGS) ×2
BAG DECANTER FOR FLEXI CONT (MISCELLANEOUS) ×1 IMPLANT
BAG SNAP BAND KOVER 36X36 (MISCELLANEOUS) ×6 IMPLANT
BLADE CLIPPER SURG (BLADE) IMPLANT
BLADE STERNUM SYSTEM 6 (BLADE) ×2 IMPLANT
CABLE ADAPT CONN TEMP 6FT (ADAPTER) ×3 IMPLANT
CANISTER SUCT 3000ML PPV (MISCELLANEOUS) ×1 IMPLANT
CANNULA FEM VENOUS REMOTE 22FR (CANNULA) IMPLANT
CANNULA OPTISITE PERFUSION 16F (CANNULA) IMPLANT
CANNULA OPTISITE PERFUSION 18F (CANNULA) IMPLANT
CATH CROSS OVER TEMPO 5F (CATHETERS) ×1 IMPLANT
CATH DIAG EXPO 6F VENT PIG 145 (CATHETERS) ×6 IMPLANT
CATH EXPO 5FR AL1 (CATHETERS) ×3 IMPLANT
CATH INFINITI 6F AL2 (CATHETERS) ×3 IMPLANT
CATH S G BIP PACING (SET/KITS/TRAYS/PACK) ×3 IMPLANT
CLIP VESOCCLUDE MED 24/CT (CLIP) ×2 IMPLANT
CLIP VESOCCLUDE SM WIDE 24/CT (CLIP) ×2 IMPLANT
CONT SPEC 4OZ CLIKSEAL STRL BL (MISCELLANEOUS) ×4 IMPLANT
COVER BACK TABLE 80X110 HD (DRAPES) ×3 IMPLANT
COVER DOME SNAP 22 D (MISCELLANEOUS) ×6 IMPLANT
CRADLE DONUT ADULT HEAD (MISCELLANEOUS) ×3 IMPLANT
DERMABOND ADVANCED (GAUZE/BANDAGES/DRESSINGS) ×1
DERMABOND ADVANCED .7 DNX12 (GAUZE/BANDAGES/DRESSINGS) ×2 IMPLANT
DEVICE CLOSURE PERCLS PRGLD 6F (VASCULAR PRODUCTS) ×4 IMPLANT
DRAPE INCISE IOBAN 66X45 STRL (DRAPES) IMPLANT
DRSG TEGADERM 4X4.75 (GAUZE/BANDAGES/DRESSINGS) ×3 IMPLANT
ELECT CAUTERY BLADE 6.4 (BLADE) ×3 IMPLANT
ELECT REM PT RETURN 9FT ADLT (ELECTROSURGICAL) ×3
ELECTRODE REM PT RTRN 9FT ADLT (ELECTROSURGICAL) ×4 IMPLANT
FELT TEFLON 6X6 (MISCELLANEOUS) ×2 IMPLANT
FEMORAL VENOUS CANN RAP (CANNULA) IMPLANT
GAUZE SPONGE 4X4 12PLY STRL (GAUZE/BANDAGES/DRESSINGS) ×3 IMPLANT
GLOVE BIO SURGEON STRL SZ7.5 (GLOVE) ×2 IMPLANT
GLOVE BIO SURGEON STRL SZ8 (GLOVE) ×5 IMPLANT
GLOVE EUDERMIC 7 POWDERFREE (GLOVE) ×2 IMPLANT
GLOVE ORTHO TXT STRL SZ7.5 (GLOVE) ×3 IMPLANT
GOWN STRL REUS W/ TWL LRG LVL3 (GOWN DISPOSABLE) ×6 IMPLANT
GOWN STRL REUS W/ TWL XL LVL3 (GOWN DISPOSABLE) ×8 IMPLANT
GOWN STRL REUS W/TWL LRG LVL3 (GOWN DISPOSABLE) ×9
GOWN STRL REUS W/TWL XL LVL3 (GOWN DISPOSABLE) ×12
GUIDEWIRE SAF TJ AMPL .035X180 (WIRE) ×3 IMPLANT
GUIDEWIRE SAFE TJ AMPLATZ EXST (WIRE) ×3 IMPLANT
GUIDEWIRE STRAIGHT .035 260CM (WIRE) ×3 IMPLANT
INSERT FOGARTY SM (MISCELLANEOUS) IMPLANT
KIT BASIN OR (CUSTOM PROCEDURE TRAY) ×3 IMPLANT
KIT DILATOR VASC 18G NDL (KITS) IMPLANT
KIT HEART LEFT (KITS) ×3 IMPLANT
KIT SUCTION CATH 14FR (SUCTIONS) ×6 IMPLANT
KIT TURNOVER KIT B (KITS) ×3 IMPLANT
LOOP VESSEL MAXI BLUE (MISCELLANEOUS) IMPLANT
LOOP VESSEL MINI RED (MISCELLANEOUS) IMPLANT
NDL PERC 18GX7CM (NEEDLE) ×2 IMPLANT
NEEDLE PERC 18GX7CM (NEEDLE) ×3 IMPLANT
NS IRRIG 1000ML POUR BTL (IV SOLUTION) ×8 IMPLANT
PACK ENDOVASCULAR (PACKS) ×3 IMPLANT
PAD ARMBOARD 7.5X6 YLW CONV (MISCELLANEOUS) ×6 IMPLANT
PAD ELECT DEFIB RADIOL ZOLL (MISCELLANEOUS) ×3 IMPLANT
PENCIL BUTTON HOLSTER BLD 10FT (ELECTRODE) ×3 IMPLANT
PERCLOSE PROGLIDE 6F (VASCULAR PRODUCTS) ×6
SET MICROPUNCTURE 5F STIFF (MISCELLANEOUS) ×4 IMPLANT
SHEATH PINNACLE 6F 10CM (SHEATH) ×6 IMPLANT
SHEATH PINNACLE 8F 10CM (SHEATH) ×3 IMPLANT
SLEEVE REPOSITIONING LENGTH 30 (MISCELLANEOUS) ×3 IMPLANT
SPONGE LAP 4X18 X RAY DECT (DISPOSABLE) ×3 IMPLANT
STOPCOCK MORSE 400PSI 3WAY (MISCELLANEOUS) ×6 IMPLANT
SUT ETHIBOND X763 2 0 SH 1 (SUTURE) IMPLANT
SUT GORETEX CV 4 TH 22 36 (SUTURE) IMPLANT
SUT GORETEX CV4 TH-18 (SUTURE) IMPLANT
SUT MNCRL AB 3-0 PS2 18 (SUTURE) IMPLANT
SUT PROLENE 5 0 C 1 36 (SUTURE) IMPLANT
SUT PROLENE 6 0 C 1 30 (SUTURE) IMPLANT
SUT SILK  1 MH (SUTURE) ×1
SUT SILK 1 MH (SUTURE) ×2 IMPLANT
SUT VIC AB 2-0 CT1 27 (SUTURE)
SUT VIC AB 2-0 CT1 TAPERPNT 27 (SUTURE) IMPLANT
SUT VIC AB 2-0 CTX 36 (SUTURE) IMPLANT
SUT VIC AB 3-0 SH 8-18 (SUTURE) IMPLANT
SYR 50ML LL SCALE MARK (SYRINGE) ×3 IMPLANT
SYR BULB IRRIGATION 50ML (SYRINGE) IMPLANT
SYR CONTROL 10ML LL (SYRINGE) ×1 IMPLANT
TOWEL GREEN STERILE (TOWEL DISPOSABLE) ×6 IMPLANT
TOWEL OR NON WOVEN STRL DISP B (DISPOSABLE) ×3 IMPLANT
TRANSDUCER W/STOPCOCK (MISCELLANEOUS) ×6 IMPLANT
TRAY FOLEY SILVER 14FR TEMP (SET/KITS/TRAYS/PACK) ×2 IMPLANT
TUBE SUCT INTRACARD DLP 20F (MISCELLANEOUS) IMPLANT
VALVE HEART TRANSCATH SZ3 23MM (Prosthesis & Implant Heart) ×1 IMPLANT
WIRE .035 3MM-J 145CM (WIRE) ×3 IMPLANT
WIRE AMPLATZ SS-J .035X180CM (WIRE) ×3 IMPLANT
WIRE BENTSON .035X145CM (WIRE) ×3 IMPLANT
YANKAUER SUCT BULB TIP NO VENT (SUCTIONS) ×2 IMPLANT

## 2018-02-15 NOTE — Anesthesia Preprocedure Evaluation (Addendum)
Anesthesia Evaluation  Patient identified by MRN, date of birth, ID band Patient awake    Reviewed: Allergy & Precautions, NPO status , Patient's Chart, lab work & pertinent test results  Airway Mallampati: II  TM Distance: >3 FB Neck ROM: Full    Dental no notable dental hx.    Pulmonary former smoker,    Pulmonary exam normal breath sounds clear to auscultation       Cardiovascular hypertension, Pt. on medications + CAD  Normal cardiovascular exam+ Valvular Problems/Murmurs AS  Rhythm:Regular Rate:Normal  ECG: NSR, rate 84  Cardiologist: Adrian Prows, MD   Neuro/Psych negative neurological ROS  negative psych ROS   GI/Hepatic negative GI ROS, Neg liver ROS,   Endo/Other  diabetes, Oral Hypoglycemic Agents  Renal/GU negative Renal ROS     Musculoskeletal negative musculoskeletal ROS (+)   Abdominal (+) + obese,   Peds  Hematology  (+) anemia , HLD   Anesthesia Other Findings Severe Aortic Stenosis  Reproductive/Obstetrics                            Anesthesia Physical Anesthesia Plan  ASA: IV  Anesthesia Plan: MAC   Post-op Pain Management:    Induction: Intravenous  PONV Risk Score and Plan: 2 and Ondansetron, Treatment may vary due to age or medical condition and Dexamethasone  Airway Management Planned: Simple Face Mask  Additional Equipment: Arterial line, CVP and Ultrasound Guidance Line Placement  Intra-op Plan:   Post-operative Plan:   Informed Consent: I have reviewed the patients History and Physical, chart, labs and discussed the procedure including the risks, benefits and alternatives for the proposed anesthesia with the patient or authorized representative who has indicated his/her understanding and acceptance.   Dental advisory given  Plan Discussed with: CRNA  Anesthesia Plan Comments:        Anesthesia Quick Evaluation

## 2018-02-15 NOTE — Interval H&P Note (Signed)
History and Physical Interval Note:  02/15/2018 1:18 PM  Mallory Thomas  has presented today for surgery, with the diagnosis of Severe Aortic Stenosis  The various methods of treatment have been discussed with the patient and family. After consideration of risks, benefits and other options for treatment, the patient has consented to  Procedure(s): TRANSCATHETER AORTIC VALVE REPLACEMENT, TRANSFEMORAL (N/A) TRANSESOPHAGEAL ECHOCARDIOGRAM (TEE) (N/A) as a surgical intervention .  The patient's history has been reviewed, patient examined, no change in status, stable for surgery.  I have reviewed the patient's chart and labs.  Questions were answered to the patient's satisfaction.     Sherren Mocha

## 2018-02-15 NOTE — Op Note (Signed)
[facility]   MULTIDISCIPLINARY HEART VALVE TEAM   TAVR OPERATIVE NOTE   Date of Procedure:  02/15/2018  Preoperative Diagnosis: Severe Aortic Stenosis   Postoperative Diagnosis: Same   Procedure:    Transcatheter Aortic Valve Replacement - Percutaneous Right Transfemoral Approach  Edwards Sapien 3 THV (size 23 mm, model # 9600TFX, serial # [id number])   Co-Surgeons:  Sherren Mocha, MD and Valentina Gu. Roxy Manns, MD   Anesthesiologist:  Adele Barthel, MD  Echocardiographer:  Loralie Champagne, MD  Pre-operative Echo Findings:  Severe aortic stenosis  Normal left ventricular systolic function  Post-operative Echo Findings:  No paravalvular leak  Normal left ventricular systolic function   BRIEF CLINICAL NOTE AND INDICATIONS FOR SURGERY  Patient is an 84 year old female with history of aortic stenosis, hypertension, type 2 diabetes mellitus, hyperlipidemia, and history of rheumatic fever during childhood who has been referred for second surgical opinion to discuss treatment options for management of severe aortic stenosis.  Patient states that she has been told that she had a heart murmur ever since she had rheumatic fever at age 71.  She has no other history of cardiac disease.  She has been followed for the last several years by Dr. Einar Gip.  Transthoracic echocardiograms have demonstrated the presence of normal left ventricular systolic function with aortic stenosis that has progressed in severity.  Follow-up echocardiogram performed December 08, 2017 at Dr. Irven Shelling office reportedly revealed further significant progression of disease with severe aortic stenosis.  Peak velocity across the aortic valve was reported 4.3 m/s corresponding to mean transvalvular gradient estimated 47 mmHg and aortic valve area calculated 0.65 cm.  There was normal left ventricular systolic function with ejection fraction calculated 67%.  No other significant abnormalities were noted.  The  patient underwent diagnostic cardiac catheterization by Dr. Einar Gip on December 24, 2017.  Transvalvular gradient across the aortic valve was not assessed.  There was single-vessel coronary artery disease with 85% discrete stenosis of the mid right coronary artery.  There was nonobstructive disease in the left coronary distribution.  Pulmonary artery pressures were normal.  The patient was referred to the multidisciplinary heart valve clinic and evaluated by Dr. Cyndia Bent earlier this month.  CT angiography was performed and the patient was evaluated by Dr. Burt Knack.  The patient has been essentially asymptomatic until recently, although she now admits to mild symptoms of shortness of breath with more strenuous physical exertion.  She also reports occasional dizzy spells.  The patient has not had symptoms consistent with angina pectoris, and transcatheter aortic valve replacement was discussed as an alternative to conventional surgical aortic valve replacement with or without coronary artery bypass grafting.  The patient was referred for surgical consultation.  During the course of the patient's preoperative work up they have been evaluated comprehensively by a multidisciplinary team of specialists coordinated through the [facility] in the [facility].  They have been demonstrated to suffer from symptomatic severe aortic stenosis as noted above. The patient has been counseled extensively as to the relative risks and benefits of all options for the treatment of severe aortic stenosis including long term medical therapy, conventional surgery for aortic valve replacement, and transcatheter aortic valve replacement.  All questions have been answered, and the patient provides full informed consent for the operation as described.   DETAILS OF THE OPERATIVE PROCEDURE  PREPARATION:    The patient is brought to the operating room on the above mentioned date and central  monitoring was established by the anesthesia team including placement of a central venous line and radial arterial line. The patient is placed in the supine position on the operating table.  Intravenous antibiotics are administered. The patient is monitored closely throughout the procedure under conscious sedation.     Baseline transthoracic echocardiogram was performed. The patient's chest, abdomen, both groins, and both lower extremities are prepared and draped in a sterile manner. A time out procedure is performed.   PERIPHERAL ACCESS:    Using the modified Seldinger technique, femoral arterial and venous access was obtained with placement of 6 Fr sheaths on the left side.  A pigtail diagnostic catheter was passed through the left arterial sheath under fluoroscopic guidance into the aortic root.  A temporary transvenous pacemaker catheter was passed through the left femoral venous sheath under fluoroscopic guidance into the right ventricle.  The pacemaker was tested to ensure stable lead placement and pacemaker capture. Aortic root angiography was performed in order to determine the optimal angiographic angle for valve deployment.   TRANSFEMORAL ACCESS:   Percutaneous transfemoral access and sheath placement was performed by Dr. Burt Knack using ultrasound guidance.  The right common femoral artery was cannulated using a micropuncture needle and appropriate location was verified using hand injection angiogram.  A pair of Abbott Perclose percutaneous closure devices were placed and a 6 French sheath replaced into the femoral artery.  The patient was heparinized systemically and ACT verified > 250 seconds.    A 14 Fr transfemoral E-sheath was introduced into the right common femoral artery after progressively dilating over an Amplatz superstiff wire. An AL-1 catheter was used to direct a straight-tip exchange length wire across the native aortic valve into the left ventricle. This was exchanged out for a  pigtail catheter and position was confirmed in the LV apex. Simultaneous LV and Ao pressures were recorded.  The pigtail catheter was exchanged for an Amplatz Extra-stiff wire in the LV apex.  Echocardiography was utilized to confirm appropriate wire position and no sign of entanglement in the mitral subvalvular apparatus.    TRANSCATHETER HEART VALVE DEPLOYMENT:   An Edwards Sapien 3 transcatheter heart valve (size 23 mm, model #9600TFX, serial #[id number]) was prepared and crimped per manufacturer's guidelines, and the proper orientation of the valve is confirmed on the [employer] delivery system. The valve was advanced through the introducer sheath using normal technique until in an appropriate position in the abdominal aorta beyond the sheath tip. The balloon was then retracted and using the fine-tuning wheel was centered on the valve. The valve was then advanced across the aortic arch using appropriate flexion of the catheter. The valve was carefully positioned across the aortic valve annulus. The Commander catheter was retracted using normal technique. Once final position of the valve has been confirmed by angiographic assessment, the valve is deployed while temporarily holding ventilation and during rapid ventricular pacing to maintain systolic blood pressure < 50 mmHg and pulse pressure < 10 mmHg. The balloon inflation is held for >3 seconds after reaching full deployment volume. Once the balloon has fully deflated the balloon is retracted into the ascending aorta and valve function is assessed using echocardiography. There is felt to be no paravalvular leak and no central aortic insufficiency.  The patient's hemodynamic recovery following valve deployment is good.  The deployment balloon and guidewire are both removed.    PROCEDURE COMPLETION:   The sheath was removed and femoral artery closure performed by Dr Burt Knack.  Protamine was administered once  femoral arterial repair was complete. The  temporary pacemaker, pigtail catheters and femoral sheaths were removed with manual pressure used for hemostasis.   The patient tolerated the procedure well and is transported to the surgical intensive care in stable condition. There were no immediate intraoperative complications. All sponge instrument and needle counts are verified correct at completion of the operation.   No blood products were administered during the operation.  The patient received a total of 39.8 mL of intravenous contrast during the procedure.   Rexene Alberts, MD 02/15/2018 3:13 PM

## 2018-02-15 NOTE — Interval H&P Note (Signed)
History and Physical Interval Note:  02/15/2018 12:59 PM  Mallory Thomas  has presented today for surgery, with the diagnosis of Severe Aortic Stenosis  The various methods of treatment have been discussed with the patient and family. After consideration of risks, benefits and other options for treatment, the patient has consented to  Procedure(s): TRANSCATHETER AORTIC VALVE REPLACEMENT, TRANSFEMORAL (N/A) TRANSESOPHAGEAL ECHOCARDIOGRAM (TEE) (N/A) as a surgical intervention .  The patient's history has been reviewed, patient examined, no change in status, stable for surgery.  I have reviewed the patient's chart and labs.  Questions were answered to the patient's satisfaction.     Rexene Alberts

## 2018-02-15 NOTE — Progress Notes (Signed)
Order for sheath removal verified per post procedural orders. Procedure explained to patient and 75F artery AND 75F Venous access site assessed: level 0, palpable dorsalis pedis. 6 [place] Sheaths removed and manual pressure applied for 20 minutes. Pre, peri, & post procedural vitals: HR 70, RR 16, O2 Sat 95%, BP 113/55, Pain level 0. Distal pulses remained intact after sheath removal. Access site level 0 and dressed with 4X4 gauze and tegaderm.  Attending  RN confirmed condition of site. Post procedural instructions discussed with attending nurse to give to the pt when she is more alert.Marland Kitchen

## 2018-02-15 NOTE — Progress Notes (Signed)
  Echocardiogram 2D Echocardiogram limited for TAVR has been performed.  Darlina Sicilian M 02/15/2018, 3:05 PM

## 2018-02-15 NOTE — OR Nursing (Signed)
First call to CVICU charge nurse at 1440. Spoke to [place].

## 2018-02-15 NOTE — Transfer of Care (Signed)
Immediate Anesthesia Transfer of Care Note  Patient: Mallory Thomas  Procedure(s) Performed: TRANSCATHETER AORTIC VALVE REPLACEMENT, TRANSFEMORAL (N/A Chest) TRANSESOPHAGEAL ECHOCARDIOGRAM (TEE) (N/A )  Patient Location: SICU  Anesthesia Type:MAC  Level of Consciousness: awake  Airway & Oxygen Therapy: Patient Spontanous Breathing and Patient connected to face mask oxygen  Post-op Assessment: Report given to RN, Post -op Vital signs reviewed and stable and Patient moving all extremities X 4  Post vital signs: Reviewed and stable  Last Vitals:  Vitals Value Taken Time  BP    Temp    Pulse    Resp    SpO2      Last Pain:  Vitals:   02/15/18 1104  TempSrc:   PainSc: 0-No pain      Patients Stated Pain Goal: 2 (16/57/90 3833)  Complications: No apparent anesthesia complications

## 2018-02-15 NOTE — Op Note (Signed)
[facility]   MULTIDISCIPLINARY HEART VALVE TEAM   TAVR OPERATIVE NOTE   Date of Procedure:  02/15/2018  Preoperative Diagnosis: Severe Aortic Stenosis   Postoperative Diagnosis: Same   Procedure:    Transcatheter Aortic Valve Replacement - Percutaneous Transfemoral Approach  Edwards Sapien 3 THV (size 23 mm, model # 9600TFX, serial # [id number])   Co-Surgeons:  Valentina Gu. Roxy Manns, MD and Sherren Mocha, MD  Anesthesiologist:  Dr Roanna Banning  Echocardiographer:  Dr Aundra Dubin  Pre-operative Echo Findings:  Severe aortic stenosis  normal left ventricular systolic function  Post-operative Echo Findings:  no paravalvular leak  normal left ventricular systolic function  BRIEF CLINICAL NOTE AND INDICATIONS FOR SURGERY  Please see complete note of Dr Roxy Manns for all background details of the patient's history.  During the course of the patient's preoperative work up they have been evaluated comprehensively by a multidisciplinary team of specialists coordinated through the [facility] in the [facility].  They have been demonstrated to suffer from symptomatic severe aortic stenosis as noted above. The patient has been counseled extensively as to the relative risks and benefits of all options for the treatment of severe aortic stenosis including long term medical therapy, conventional surgery for aortic valve replacement, and transcatheter aortic valve replacement.  The patient has been independently evaluated by two cardiac surgeons including Dr. Roxy Manns and Dr. Dr Cyndia Bent, and they are felt to be at moderate risk for conventional surgical aortic valve replacement based upon a predicted risk of mortality using the Society of Thor[employer] Surgeons risk calculator of 3.5%. Based upon review of all of the patient's preoperative diagnostic tests they are felt to be candidate for transcatheter aortic valve replacement using the transfemoral  approach as an alternative to high risk conventional surgery.    Following the decision to proceed with transcatheter aortic valve replacement, a discussion has been held regarding what types of management strategies would be attempted intraoperatively in the event of life-threatening complications, including whether or not the patient would be considered a candidate for the use of cardiopulmonary bypass and/or conversion to open sternotomy for attempted surgical intervention.  The patient has been advised of a variety of complications that might develop peculiar to this approach including but not limited to risks of death, stroke, paravalvular leak, aortic dissection or other major vascular complications, aortic annulus rupture, device embolization, cardiac rupture or perforation, acute myocardial infarction, arrhythmia, heart block or bradycardia requiring permanent pacemaker placement, congestive heart failure, respiratory failure, renal failure, pneumonia, infection, other late complications related to structural valve deterioration or migration, or other complications that might ultimately cause a temporary or permanent loss of functional independence or other long term morbidity.  The patient provides full informed consent for the procedure as described and all questions were answered preoperatively.  DETAILS OF THE OPERATIVE PROCEDURE  PREPARATION:   The patient is brought to the operating room on the above mentioned date and central monitoring was established by the anesthesia team including placement of a central venous catheter and radial arterial line. The patient is placed in the supine position on the operating table.  Intravenous antibiotics are administered. The patient is monitored closely throughout the procedure under conscious sedation.  Baseline transthoracic echocardiogram is performed. The patient's chest, abdomen, both groins, and both lower extremities are prepared and draped in a  sterile manner. A time out procedure is performed.   PERIPHERAL ACCESS:   Using ultrasound guidance, femoral arterial and venous access  is obtained with placement of 6 Fr sheaths on the left side. Ultrasound images are captured and stored in the patient's chart. A pigtail diagnostic catheter was passed through the femoral arterial sheath under fluoroscopic guidance into the aortic root.  A temporary transvenous pacemaker catheter was passed through the femoral venous sheath under fluoroscopic guidance into the right ventricle.  The pacemaker was tested to ensure stable lead placement and pacemaker capture. Aortic root angiography was performed in order to determine the optimal angiographic angle for valve deployment.  TRANSFEMORAL ACCESS:  A micropuncture technique is used to access the left femoral artery under fluoroscopic and ultrasound guidance.  2 Perclose devices are deployed at 10' and 2' positions to 'PreClose' the femoral artery. An 8 French sheath is placed and then an Amplatz Superstiff wire is advanced through the sheath. This is changed out for a 14 French transfemoral E-Sheath after progressively dilating over the Superstiff wire.  An AL-1 catheter was used to direct a straight-tip exchange length wire across the native aortic valve into the left ventricle. This was exchanged out for a pigtail catheter and position was confirmed in the LV apex. Simultaneous LV and Ao pressures were recorded.  The pigtail catheter was exchanged for an Amplatz Extra-stiff wire in the LV apex.  Echocardiography was utilized to confirm appropriate wire position and no sign of entanglement in the mitral subvalvular apparatus.  BALLOON AORTIC VALVULOPLASTY:  Not performed  TRANSCATHETER HEART VALVE DEPLOYMENT:  An Edwards Sapien 3 transcatheter heart valve (size 23 mm, model #9600TFX, serial #[id number]) was prepared and crimped per manufacturer's guidelines, and the proper orientation of the valve is confirmed on  the [employer] delivery system. The valve was advanced through the introducer sheath using normal technique until in an appropriate position in the abdominal aorta beyond the sheath tip. The balloon was then retracted and using the fine-tuning wheel was centered on the valve. The valve was then advanced across the aortic arch using appropriate flexion of the catheter. The valve was carefully positioned across the aortic valve annulus. The Commander catheter was retracted using normal technique. Once final position of the valve has been confirmed by angiographic assessment, the valve is deployed while temporarily holding ventilation and during rapid ventricular pacing to maintain systolic blood pressure < 50 mmHg and pulse pressure < 10 mmHg. The balloon inflation is held for >3 seconds after reaching full deployment volume. Once the balloon has fully deflated the balloon is retracted into the ascending aorta and valve function is assessed using echocardiography. There is felt to be no paravalvular leak and no central aortic insufficiency.  The patient's hemodynamic recovery following valve deployment is good.  The deployment balloon and guidewire are both removed. Echo demostrated acceptable post-procedural gradients, stable mitral valve function, and no aortic insufficiency.    PROCEDURE COMPLETION:  The sheath was removed and femoral artery closure is performed using the 2 previously deployed Perclose devices.  Protamine is administered once femoral arterial repair was complete. The site is clear with no evidence of bleeding or hematoma after the sutures are tightened. The temporary pacemaker, pigtail catheters and femoral sheaths were removed with manual pressure used for hemostasis.   The patient tolerated the procedure well and is transported to the surgical intensive care in stable condition. There were no immediate intraoperative complications. All sponge instrument and needle counts are verified  correct at completion of the operation.   The patient received a total of 40 mL of intravenous contrast during the procedure.  Sherren Mocha, MD 02/15/2018 3:38 PM

## 2018-02-15 NOTE — Anesthesia Procedure Notes (Signed)
Central Venous Catheter Insertion Performed by: Murvin Natal, MD, anesthesiologist Start/End4/12/2017 12:10 PM, 02/15/2018 12:20 PM Patient location: Pre-op. Preanesthetic checklist: patient identified, IV checked, site marked, risks and benefits discussed, surgical consent, monitors and equipment checked, pre-op evaluation, timeout performed and anesthesia consent Position: Trendelenburg Lidocaine 1% used for infiltration and patient sedated Hand hygiene performed  and maximum sterile barriers used  Catheter size: 8 Fr Total catheter length 16. Central line was placed.Double lumen Procedure performed using ultrasound guided technique. Ultrasound Notes:anatomy identified, needle tip was noted to be adjacent to the nerve/plexus identified, no ultrasound evidence of intravascular and/or intraneural injection and image(s) printed for medical record Attempts: 1 Following insertion, dressing applied, line sutured and Biopatch. Post procedure assessment: blood return through all ports, free fluid flow and no air  Patient tolerated the procedure well with no immediate complications.

## 2018-02-15 NOTE — Anesthesia Postprocedure Evaluation (Signed)
Anesthesia Post Note  Patient: Mallory Thomas  Procedure(s) Performed: TRANSCATHETER AORTIC VALVE REPLACEMENT, TRANSFEMORAL (N/A Chest) TRANSESOPHAGEAL ECHOCARDIOGRAM (TEE) (N/A )     Patient location during evaluation: PACU Anesthesia Type: MAC Level of consciousness: awake and alert Pain management: pain level controlled Vital Signs Assessment: post-procedure vital signs reviewed and stable Respiratory status: spontaneous breathing, nonlabored ventilation, respiratory function stable and patient connected to nasal cannula oxygen Cardiovascular status: stable and blood pressure returned to baseline Postop Assessment: no apparent nausea or vomiting Anesthetic complications: no    Last Vitals:  Vitals:   02/15/18 1900 02/15/18 2000  BP: (!) 112/50 (!) 157/61  Pulse:    Resp: 11 11  Temp:    SpO2: 100% 100%    Last Pain:  Vitals:   02/15/18 1600  TempSrc:   PainSc: 0-No pain                 Kamillah Didonato P Jezlyn Westerfield

## 2018-02-15 NOTE — Progress Notes (Signed)
PM Round: Early post-op patient looks good.  Hemodynamically stable.  Waking up and responding appropriately.  BL groin sites clear.   Continue early post-op care.  Sherren Mocha 02/15/2018 3:57 PM

## 2018-02-16 ENCOUNTER — Inpatient Hospital Stay (HOSPITAL_COMMUNITY): Payer: Medicare Other

## 2018-02-16 ENCOUNTER — Encounter (HOSPITAL_COMMUNITY): Payer: Self-pay | Admitting: Cardiovascular Disease

## 2018-02-16 ENCOUNTER — Other Ambulatory Visit: Payer: Self-pay

## 2018-02-16 DIAGNOSIS — Z952 Presence of prosthetic heart valve: Secondary | ICD-10-CM

## 2018-02-16 DIAGNOSIS — I350 Nonrheumatic aortic (valve) stenosis: Secondary | ICD-10-CM

## 2018-02-16 LAB — CBC
HCT: 28.6 % — ABNORMAL LOW (ref 36.0–46.0)
Hemoglobin: 9.4 g/dL — ABNORMAL LOW (ref 12.0–15.0)
MCH: 31.5 pg (ref 26.0–34.0)
MCHC: 32.9 g/dL (ref 30.0–36.0)
MCV: 96 fL (ref 78.0–100.0)
Platelets: 109 10*3/uL — ABNORMAL LOW (ref 150–400)
RBC: 2.98 MIL/uL — ABNORMAL LOW (ref 3.87–5.11)
RDW: 13.6 % (ref 11.5–15.5)
WBC: 5.5 10*3/uL (ref 4.0–10.5)

## 2018-02-16 LAB — BASIC METABOLIC PANEL
Anion gap: 12 (ref 5–15)
BUN: 17 mg/dL (ref 6–20)
CO2: 17 mmol/L — ABNORMAL LOW (ref 22–32)
Calcium: 8.1 mg/dL — ABNORMAL LOW (ref 8.9–10.3)
Chloride: 107 mmol/L (ref 101–111)
Creatinine, Ser: 0.82 mg/dL (ref 0.44–1.00)
GFR calc Af Amer: >60 mL/min (ref >60)
GFR calc non Af Amer: >60 mL/min (ref >60)
Glucose, Bld: 140 mg/dL — ABNORMAL HIGH (ref 65–99)
Potassium: 4.1 mmol/L (ref 3.5–5.1)
Sodium: 136 mmol/L (ref 135–145)

## 2018-02-16 LAB — GLUCOSE, CAPILLARY
Glucose-Capillary: 94 mg/dL (ref 65–99)
Glucose-Capillary: 116 mg/dL — ABNORMAL HIGH (ref 65–99)
Glucose-Capillary: 114 mg/dL — ABNORMAL HIGH (ref 65–99)
Glucose-Capillary: 147 mg/dL — ABNORMAL HIGH (ref 65–99)
Glucose-Capillary: 99 mg/dL (ref 65–99)

## 2018-02-16 LAB — ECHOCARDIOGRAM COMPLETE: Weight: 2522.06 oz

## 2018-02-16 LAB — POCT I-STAT 4, (NA,K, GLUC, HGB,HCT)
Glucose, Bld: 188 mg/dL — ABNORMAL HIGH (ref 65–99)
HCT: 29 % — ABNORMAL LOW (ref 36.0–46.0)
Hemoglobin: 9.9 g/dL — ABNORMAL LOW (ref 12.0–15.0)
Potassium: 4.2 mmol/L (ref 3.5–5.1)
Sodium: 143 mmol/L (ref 135–145)

## 2018-02-16 LAB — MAGNESIUM: Magnesium: 1.6 mg/dL — ABNORMAL LOW (ref 1.7–2.4)

## 2018-02-16 MED ORDER — IRBESARTAN 300 MG PO TABS
300.0000 mg | ORAL_TABLET | Freq: Every day | ORAL | Status: DC
  Administered 2018-02-16 – 2018-02-17 (×2): 300 mg via ORAL
  Filled 2018-02-16 (×2): qty 1

## 2018-02-16 MED ORDER — METOPROLOL TARTRATE 12.5 MG HALF TABLET
12.5000 mg | ORAL_TABLET | Freq: Two times a day (BID) | ORAL | Status: DC
  Administered 2018-02-16 – 2018-02-17 (×2): 12.5 mg via ORAL
  Filled 2018-02-16 (×2): qty 1

## 2018-02-16 MED FILL — Phenylephrine HCl Inj 10 MG/ML: INTRAMUSCULAR | Qty: 2 | Status: AC

## 2018-02-16 MED FILL — Magnesium Sulfate Inj 50%: INTRAMUSCULAR | Qty: 10 | Status: AC

## 2018-02-16 MED FILL — Potassium Chloride Inj 2 mEq/ML: INTRAVENOUS | Qty: 40 | Status: AC

## 2018-02-16 MED FILL — Sodium Chloride IV Soln 0.9%: INTRAVENOUS | Qty: 250 | Status: AC

## 2018-02-16 MED FILL — Heparin Sodium (Porcine) Inj 1000 Unit/ML: INTRAMUSCULAR | Qty: 30 | Status: AC

## 2018-02-16 NOTE — Progress Notes (Signed)
Progress Note  Patient Name: Mallory Thomas Date of Encounter: 02/16/2018  Primary Cardiologist: No primary care provider on file.   Subjective   The patient is doing well this morning.  She is sitting up in the chair with no complaints.  She specifically denies chest pain, shortness of breath, or groin pain.  She did not sleep well at all last night.  Other than that she has no complaints.  Inpatient Medications    Scheduled Meds: . aspirin EC  81 mg Oral Daily  . atorvastatin  80 mg Oral QHS  . clopidogrel  75 mg Oral Q breakfast  . colestipol  1 g Oral QAC lunch  . insulin aspart  0-15 Units Subcutaneous TID WC  . linagliptin  5 mg Oral Daily  . metoprolol tartrate  12.5 mg Oral BID   Or  . metoprolol tartrate  12.5 mg Per Tube BID  . [START ON 02/17/2018] pantoprazole  40 mg Oral Daily  . sodium chloride flush  3 mL Intravenous Q12H   Continuous Infusions: . sodium chloride    . albumin human    .  ceFAZolin (ANCEF) IV Stopped (02/16/18 0519)  . famotidine (PEPCID) IV    . lactated ringers    . nitroGLYCERIN 40 mcg/min (02/16/18 0618)  . norepinephrine (LEVOPHED) Adult infusion     PRN Meds: sodium chloride, albumin human, lactated ringers, metoprolol tartrate, midazolam, morphine injection, ondansetron (ZOFRAN) IV, oxyCODONE, sodium chloride flush, traMADol   Vital Signs    Vitals:   02/16/18 0400 02/16/18 0500 02/16/18 0600 02/16/18 0700  BP: (!) 121/97 131/62 (!) 107/49 (!) 138/53  Pulse:      Resp: 18 13 13 14   Temp: 97.8 F (36.6 C)     TempSrc: Oral     SpO2: 99% 99% 97% 100%  Weight:   157 lb 10.1 oz (71.5 kg)     Intake/Output Summary (Last 24 hours) at 02/16/2018 0801 Last data filed at 02/16/2018 0420 Gross per 24 hour  Intake 2060.58 ml  Output 550 ml  Net 1510.58 ml   Filed Weights   02/16/18 0600  Weight: 157 lb 10.1 oz (71.5 kg)    Telemetry    Normal sinus rhythm without significant arrhythmia- Personally Reviewed  ECG      Normal sinus rhythm, age-indeterminate inferior infarct, otherwise within normal limits.- Personally Reviewed  Physical Exam  Pleasant elderly woman in no distress GEN: No acute distress.   Neck: No JVD Cardiac: RRR, grade 2/6 early peaking systolic ejection murmur at the right upper sternal border, no diastolic murmur Respiratory: Clear to auscultation bilaterally. GI: Soft, nontender, non-distended  MS: No edema; No deformity.  Bilateral groin sites are clear Neuro:  Nonfocal  Psych: Normal affect   Labs    Chemistry Recent Labs  Lab 02/11/18 1207  02/15/18 1438 02/15/18 1501 02/16/18 0422  NA 140   < > 144 143 136  K 4.7   < > 4.3 4.4 4.1  CL 111   < > 110 110 107  CO2 18*  --   --   --  17*  GLUCOSE 98   < > 154* 178* 140*  BUN 19   < > 21* 21* 17  CREATININE 0.86   < > 0.80 0.80 0.82  CALCIUM 8.5*  --   --   --  8.1*  PROT 6.6  --   --   --   --   ALBUMIN 4.0  --   --   --   --  AST 19  --   --   --   --   ALT 8*  --   --   --   --   ALKPHOS 42  --   --   --   --   BILITOT 0.6  --   --   --   --   GFRNONAA >60  --   --   --  >60  GFRAA >60  --   --   --  >60  ANIONGAP 11  --   --   --  12   < > = values in this interval not displayed.     Hematology Recent Labs  Lab 02/11/18 1207  02/15/18 1438 02/15/18 1501 02/16/18 0422  WBC 7.2  --   --   --  5.5  RBC 3.69*  --   --   --  2.98*  HGB 11.2*   < > 9.2* 9.2* 9.4*  HCT 36.0   < > 27.0* 27.0* 28.6*  MCV 97.6  --   --   --  96.0  MCH 30.4  --   --   --  31.5  MCHC 31.1  --   --   --  32.9  RDW 13.8  --   --   --  13.6  PLT 139*  --   --   --  109*   < > = values in this interval not displayed.    Cardiac EnzymesNo results for input(s): TROPONINI in the last 168 hours. No results for input(s): TROPIPOC in the last 168 hours.   BNP Recent Labs  Lab 02/11/18 1207  BNP 92.3     DDimer No results for input(s): DDIMER in the last 168 hours.   Radiology    Dg Chest Port 1 View  Result Date:  02/15/2018 CLINICAL DATA:  TAVR, aortic stenosis EXAM: PORTABLE CHEST 1 VIEW COMPARISON:  02/11/2018 FINDINGS: There is a right jugular central venous catheter with the tip projecting over the SVC. There is no focal parenchymal opacity. There is no pleural effusion or pneumothorax. The heart and mediastinal contours are unremarkable. There is evidence of prior TAVR. There is thoracic aortic atherosclerosis. The osseous structures are unremarkable. IMPRESSION: No active disease. Electronically Signed   By: Kathreen Devoid   On: 02/15/2018 16:25    Cardiac Studies   POD #1 echo pending  Patient Profile     84 y.o. female with severe, Stage D1 aortic stenosis admitted 02/15/18 for elective TAVR  Assessment & Plan    1.  Severe aortic stenosis postoperative day 1 #1 from TAVR: The patient's immediate postoperative echo in the operating room demonstrated normal function of her transcatheter heart valve with low gradients and no paravalvular regurgitation.  She will have a postoperative day #1 echo today.  We will mobilize the patient, discontinue her central line and arterial line, and transfer her to a telemetry bed.  Will start clopidogrel 75 mg daily.  Anticipate hospital discharge tomorrow.  2.  Hypertension: Resume Avapro at home dose.  Wean off of IV nitroglycerin.  3.  Coronary artery disease, native vessel, without angina: Stable with no new symptoms.  4.  Hyperlipidemia: Continue home therapy  5.  Type 2 diabetes: Blood glucoses are in a good range and reviewed this morning.  6.  Anemia, postoperative blood loss: No signs of active bleeding, hemodynamically stable.  7.  Thrombocytopenia, mild: Expected post TAVR Disposition: Transfer to telemetry today, likely home tomorrow morning.  For questions or updates, please contact [place] Please consult [email or web address] for contact info under Cardiology/STEMI.      Signed, Sherren Mocha, MD  02/16/2018, 8:01 AM

## 2018-02-16 NOTE — Progress Notes (Signed)
CARDIAC REHAB PHASE I   PRE:  Rate/Rhythm: 83 SR    BP: sitting 110/59    SaO2: 99 RA  MODE:  Ambulation: 370 ft   POST:  Rate/Rhythm: 98 SR    BP: sitting 134/55     SaO2: 100 RA  Pt with quick pace, steady, talkative. No problems, feels well. Discussed walking at home and CRPII. Pt would prefer to go back to water aerobics instead of CRPII. She can walk independently. [id number]   [place], ACSM 02/16/2018 12:01 PM

## 2018-02-16 NOTE — Progress Notes (Signed)
Patient arrived to room in NAD. VS stable and patient free from pain. Patient says that her family is aware of her transfer.

## 2018-02-16 NOTE — Progress Notes (Signed)
  Echocardiogram 2D Echocardiogram has been performed.  Sherri Mcarthy T Jhonnie Aliano 02/16/2018, 5:30 PM

## 2018-02-17 LAB — GLUCOSE, CAPILLARY
Glucose-Capillary: 137 mg/dL — ABNORMAL HIGH (ref 65–99)
Glucose-Capillary: 118 mg/dL — ABNORMAL HIGH (ref 65–99)

## 2018-02-17 MED ORDER — METOPROLOL TARTRATE 25 MG PO TABS: 12.5000 mg | ORAL_TABLET | Freq: Two times a day (BID) | ORAL | 1 refills | Status: DC

## 2018-02-17 MED ORDER — CLOPIDOGREL BISULFATE 75 MG PO TABS: 75.0000 mg | ORAL_TABLET | Freq: Every day | ORAL | 2 refills | Status: DC

## 2018-02-17 NOTE — Progress Notes (Signed)
Progress Note  Patient Name: Mallory Thomas Date of Encounter: 02/17/2018  Primary Cardiologist: No primary care provider on file.   Subjective   The patient feels well this morning.  She denies chest pain or shortness of breath.  She ambulated yesterday with no problems.  Inpatient Medications    Scheduled Meds: . aspirin EC  81 mg Oral Daily  . atorvastatin  80 mg Oral QHS  . clopidogrel  75 mg Oral Q breakfast  . colestipol  1 g Oral QAC lunch  . insulin aspart  0-15 Units Subcutaneous TID WC  . irbesartan  300 mg Oral Daily  . linagliptin  5 mg Oral Daily  . metoprolol tartrate  12.5 mg Oral BID  . pantoprazole  40 mg Oral Daily   Continuous Infusions: .  ceFAZolin (ANCEF) IV 2 g (02/17/18 0716)   PRN Meds: metoprolol tartrate, ondansetron (ZOFRAN) IV, oxyCODONE, sodium chloride flush, traMADol   Vital Signs    Vitals:   02/16/18 1438 02/16/18 2018 02/17/18 0628 02/17/18 0828  BP: (!) 143/55 (!) 149/53 (!) 150/54 (!) 149/66  Pulse: 92 90 90   Resp:  14 16   Temp:  98.6 F (37 C) 98.3 F (36.8 C) 98 F (36.7 C)  TempSrc:  Oral Oral Oral  SpO2: 98% 99% 96% 98%  Weight:        Intake/Output Summary (Last 24 hours) at 02/17/2018 0858 Last data filed at 02/16/2018 2148 Gross per 24 hour  Intake 1033 ml  Output -  Net 1033 ml   Filed Weights   02/16/18 0600  Weight: 157 lb 10.1 oz (71.5 kg)    Telemetry    Normal sinus rhythm without significant arrhythmia- Personally Reviewed   Physical Exam  Pleasant, alert and oriented woman, comfortably resting GEN: No acute distress.   Neck: No JVD Cardiac: RRR, 2/6 early peaking systolic ejection murmur at the right upper sternal border, no diastolic murmur Respiratory: Clear to auscultation bilaterally. GI: Soft, nontender, non-distended  MS: No edema; No deformity.  Bilateral groin sites are clear.  Bandages are removed. Neuro:  Nonfocal  Psych: Normal affect   Labs    Chemistry Recent Labs  Lab  02/11/18 1207  02/15/18 1438 02/15/18 1501 02/15/18 1554 02/16/18 0422  NA 140   < > 144 143 143 136  K 4.7   < > 4.3 4.4 4.2 4.1  CL 111   < > 110 110  --  107  CO2 18*  --   --   --   --  17*  GLUCOSE 98   < > 154* 178* 188* 140*  BUN 19   < > 21* 21*  --  17  CREATININE 0.86   < > 0.80 0.80  --  0.82  CALCIUM 8.5*  --   --   --   --  8.1*  PROT 6.6  --   --   --   --   --   ALBUMIN 4.0  --   --   --   --   --   AST 19  --   --   --   --   --   ALT 8*  --   --   --   --   --   ALKPHOS 42  --   --   --   --   --   BILITOT 0.6  --   --   --   --   --  GFRNONAA >60  --   --   --   --  >60  GFRAA >60  --   --   --   --  >60  ANIONGAP 11  --   --   --   --  12   < > = values in this interval not displayed.     Hematology Recent Labs  Lab 02/11/18 1207  02/15/18 1501 02/15/18 1554 02/16/18 0422  WBC 7.2  --   --   --  5.5  RBC 3.69*  --   --   --  2.98*  HGB 11.2*   < > 9.2* 9.9* 9.4*  HCT 36.0   < > 27.0* 29.0* 28.6*  MCV 97.6  --   --   --  96.0  MCH 30.4  --   --   --  31.5  MCHC 31.1  --   --   --  32.9  RDW 13.8  --   --   --  13.6  PLT 139*  --   --   --  109*   < > = values in this interval not displayed.    Cardiac EnzymesNo results for input(s): TROPONINI in the last 168 hours. No results for input(s): TROPIPOC in the last 168 hours.   BNP Recent Labs  Lab 02/11/18 1207  BNP 92.3     DDimer No results for input(s): DDIMER in the last 168 hours.   Radiology    Dg Chest Port 1 View  Result Date: 02/15/2018 CLINICAL DATA:  TAVR, aortic stenosis EXAM: PORTABLE CHEST 1 VIEW COMPARISON:  02/11/2018 FINDINGS: There is a right jugular central venous catheter with the tip projecting over the SVC. There is no focal parenchymal opacity. There is no pleural effusion or pneumothorax. The heart and mediastinal contours are unremarkable. There is evidence of prior TAVR. There is thoracic aortic atherosclerosis. The osseous structures are unremarkable. IMPRESSION: No  active disease. Electronically Signed   By: Kathreen Devoid   On: 02/15/2018 16:25    Cardiac Studies   Postoperative day #1 echo: Study Conclusions  - Left ventricle: The cavity size was normal. Wall thickness was   increased in a pattern of moderate LVH. Systolic function was   vigorous. The estimated ejection fraction was in the range of 65%   to 70%. LV mid-cavity gradient peak 33 mmHg. Wall motion was   normal; there were no regional wall motion abnormalities. Doppler   parameters are consistent with abnormal left ventricular   relaxation (grade 1 diastolic dysfunction). - Aortic valve: Bioprosthetic aortic valve s/p TAVR. Elevated mean   gradient across the valve, may be due in part to vigorous LV   systolic function/high output. There was no regurgitation. Mean   gradient (S): 22 mm Hg. Valve area (VTI): 1.14 cm^2. - Mitral valve: There was trivial regurgitation. - Left atrium: The atrium was moderately dilated. - Right ventricle: The cavity size was normal. Systolic function   was normal. - Tricuspid valve: Peak RV-RA gradient (S): 26 mm Hg. - Pulmonary arteries: PA peak pressure: 29 mm Hg (S). - Inferior vena cava: The vessel was normal in size. The   respirophasic diameter changes were in the normal range (>= 50%),   consistent with normal central venous pressure. - Pericardium, extracardiac: A trivial pericardial effusion was   identified.  Impressions:  - Normal LV size with moderate LV hypertrophy. Vigorous systolic   function, EF 35-70% with mid cavity gradient peak 33 mmHg.  Normal   RV size and systolic function. There was a bioprosthetic aortic   valve s/p TAVR. Elevated mean gradient across the valve to 22   mmHg. This may be related to vigorous LV systolic function/high   output. Would consider beta blockade.   Patient Profile     84 y.o. female with severe, Stage D1 aortic stenosis admitted 02/15/18 for elective TAVR  Assessment & Plan    1.  Severe  aortic stenosis: The patient has done very well with TAVR.  She was treated with a 23 mm Edwards Sapien 3 transcatheter heart valve.  Postoperative day #1 echo study is reviewed and demonstrates an elevated mean gradient of 22 mmHg.  I suspect this is related to hyperdynamic LV function as suggested in the echo interpretation.  The patient is also noted to be mildly anemic.  She will continue antiplatelet therapy with aspirin and clopidogrel.  She is stable for discharge.  Will reassess valve function at 30 days with an echocardiogram per protocol.  I discussed at length post-TAVR recovery, restrictions, and follow-up plans.  Considering the patient's rhythm stability and good functional capacity, I advised that she could resume driving in 48 hours.  2.  Hypertension: The patient will continue on Avapro.  Metoprolol 12.5 mg twice daily has been added to her medical regimen.  3.  Coronary artery disease, native vessel, without angina: The patient is stable.  4.  Hyperlipidemia: She continues on her home therapy.  5.  Type 2 diabetes: Blood glucoses have been in a good range during her hospital stay.  Disposition: Home today.  For questions or updates, please contact [place] Please consult [email or web address] for contact info under Cardiology/STEMI.      Signed, Sherren Mocha, MD  02/17/2018, 8:58 AM

## 2018-02-17 NOTE — Care Management Note (Signed)
Case Management Note Marvetta Gibbons RN, BSN Unit 4E-Case Manager [phone]  Patient Details  Name: Mallory Thomas MRN: [id number] Date of Birth: 1932-03-15  Subjective/Objective:   Pt admitted s/p TAVR                Action/Plan: PTA pt lived at home alone, daughter to stay with pt post discharge- no CM needs noted for transition home. Pt stable for d/c on 02/17/18  Expected Discharge Date:  02/17/18               Expected Discharge Plan:  Home/Self Care  In-House Referral:  NA  Discharge planning Services  CM Consult  Post Acute Care Choice:  NA Choice offered to:  NA  DME Arranged:    DME Agency:     HH Arranged:    [place] Agency:     Status of Service:  Completed, signed off  If discussed at [place] of Stay Meetings, dates discussed:    Discharge Disposition: home/self care   Additional Comments:  Dawayne Patricia, RN 02/17/2018, 11:34 AM

## 2018-02-17 NOTE — Discharge Summary (Signed)
Discharge Summary    Patient ID: Mallory Thomas,  MRN: [id number], DOB/AGE: 07/15/1932 84 y.o.  Admit date: 02/15/2018 Discharge date: 02/17/2018  Primary Care Provider: Jani Gravel Primary Cardiologist: Dr. Einar Gip  Discharge Diagnoses    Principal Problem:   S/P TAVR (transcatheter aortic valve replacement) Active Problems:   CKD (chronic kidney disease), stage III ([place])   Aortic stenosis   Hypertension   Type 2 diabetes mellitus ([place])   Coronary artery disease involving native coronary artery with other form of angina pectoris, unspecified whether native or transplanted heart (HCC)   Severe aortic stenosis   Allergies Allergies  Allergen Reactions  . Fish Allergy Hives  . Iodine Hives    Allergic to ALL seafood, shrimp, crab etc.  . Shellfish Allergy Hives  . Tape Hives  . Zetia [Ezetimibe] Diarrhea    Diagnostic Studies/Procedures    TAVR: 02/15/18  Procedure:        Transcatheter Aortic Valve Replacement - Percutaneous Right Transfemoral Approach             Edwards Sapien 3 THV (size 23 mm, model # 9600TFX, serial # [id number])              Co-Surgeons:                        Sherren Mocha, MD and Valentina Gu. Roxy Manns, MD   Anesthesiologist:                  Adele Barthel, MD  Echocardiographer:              Loralie Champagne, MD  Pre-operative Echo Findings: ? Severe aortic stenosis ? Normal left ventricular systolic function  Post-operative Echo Findings: ? No paravalvular leak ? Normal left ventricular systolic function _____________   History of Present Illness     83 year old female with history of aortic stenosis, hypertension, type 2 diabetes mellitus, hyperlipidemia, and history of rheumatic fever during childhood who was referred for discuss treatment options for management of severe aortic stenosis.  Patient stateed that she had been told that she had a heart murmur ever since she had rheumatic fever at age 28. She has no other history  of cardiac disease. She has been followed for the last several years by Dr. Einar Gip. Transthoracic echocardiograms have demonstrated the presence of normal left ventricular systolic function with aortic stenosis that has progressed in severity. Follow-up echocardiogram performed December 08, 2017 at Dr. Irven Shelling office reportedly revealed further significant progression of disease with severe aortic stenosis. Peak velocity across the aortic valve was reported 4.3 m/s corresponding to mean transvalvular gradient estimated 47 mmHg and aortic valve area calculated 0.65 cm. There was normal left ventricular systolic function with ejection fraction calculated 67%. No other significant abnormalities were noted. The patient underwent diagnostic cardiac catheterization by Dr. Einar Gip on December 24, 2017. Transvalvular gradient across the aortic valve was not assessed. There was single-vessel coronary artery disease with 85% discrete stenosis of the mid right coronary artery. There was nonobstructive disease in the left coronary distribution. Pulmonary artery pressures were normal. The patient was referred to the multidisciplinary heart valve clinic and evaluated by Dr. Cyndia Bent. CT angiography was performed and the patient was evaluated by Dr. Burt Knack.The patient has been essentially asymptomatic until recently, although she now admits to mild symptoms of shortness of breath with more strenuous physical exertion. She also reported occasional dizzy spells. The patient has not had symptoms consistent  with angina pectoris, and transcatheter aortic valve replacement was discussed as an alternative to conventional surgical aortic valve replacement with or without coronary artery bypass grafting. The patient was deemed an appropriate candidate for TAVR and scheduled for surgery.   Hospital Course     Underwent successful TAVR with Edwards Sapien 3 THV (size 23 mm) with Dr. Burt Knack and Dr. Roxy Manns. She felt well the  following day and was able to ambulate. Her central line and arterial line were removed. Was noted be hypertensive post op and placed on nitro which was able to be weaned. Her home dose of Avapro was resumed. Had expected post op thrombocytopenia. Follow up echo showed elevated mean gradient of 22 mmHg, which was felt to be related to hyperdynamic LV function. Plan for ASA and Plavix. Blood pressures remains elevated, therefore was started on metoprolol 12.5 mg BID.   Creig Hines was seen by Dr. Burt Knack and determined stable for discharge home. Follow up in the office has been arranged. Medications are listed below.   _____________  Discharge Vitals Blood pressure (!) 149/66, pulse 90, temperature 98 F (36.7 C), temperature source Oral, resp. rate 16, weight 157 lb 10.1 oz (71.5 kg), SpO2 98 %.  Filed Weights   02/16/18 0600  Weight: 157 lb 10.1 oz (71.5 kg)    Labs & Radiologic Studies    CBC Recent Labs    02/15/18 1554 02/16/18 0422  WBC  --  5.5  HGB 9.9* 9.4*  HCT 29.0* 28.6*  MCV  --  96.0  PLT  --  660*   Basic Metabolic Panel Recent Labs    02/15/18 1501 02/15/18 1554 02/16/18 0422  NA 143 143 136  K 4.4 4.2 4.1  CL 110  --  107  CO2  --   --  17*  GLUCOSE 178* 188* 140*  BUN 21*  --  17  CREATININE 0.80  --  0.82  CALCIUM  --   --  8.1*  MG  --   --  1.6*   Liver Function Tests No results for input(s): AST, ALT, ALKPHOS, BILITOT, PROT, ALBUMIN in the last 72 hours. No results for input(s): LIPASE, AMYLASE in the last 72 hours. Cardiac Enzymes No results for input(s): CKTOTAL, CKMB, CKMBINDEX, TROPONINI in the last 72 hours. BNP Invalid input(s): POCBNP D-Dimer No results for input(s): DDIMER in the last 72 hours. Hemoglobin A1C No results for input(s): HGBA1C in the last 72 hours. Fasting Lipid Panel No results for input(s): CHOL, HDL, LDLCALC, TRIG, CHOLHDL, LDLDIRECT in the last 72 hours. Thyroid Function Tests No results for input(s):  TSH, T4TOTAL, T3FREE, THYROIDAB in the last 72 hours.  Invalid input(s): FREET3 _____________  Dg Chest 2 View  Result Date: 02/11/2018 CLINICAL DATA:  Preop for TAVR surgery EXAM: CHEST - 2 VIEW COMPARISON:  None. FINDINGS: Mild cardiomegaly. Atherosclerotic changes noted at the aortic arch. Large hiatal hernia noted, better demonstrated on earlier CT. Subtle nodular density at the right lung apex, described as probably benign areas of mucoid impaction within terminal bronchials on earlier chest CT report of 01/10/2018. Lungs otherwise clear. No confluent opacity to suggest a developing pneumonia. No pleural effusion seen. No acute or suspicious osseous finding. IMPRESSION: 1. No active cardiopulmonary disease. No evidence of pneumonia or pulmonary edema. 2. Subtle small nodular density at the right lung apex, corresponding to description of probably benign areas of mucoid impaction within terminal bronchials on chest CT report of 01/10/2018. 3. Mild cardiomegaly. 4.  Aortic atherosclerosis. Electronically Signed   By: Franki Cabot M.D.   On: 02/11/2018 14:39   Dg Chest Port 1 View  Result Date: 02/15/2018 CLINICAL DATA:  TAVR, aortic stenosis EXAM: PORTABLE CHEST 1 VIEW COMPARISON:  02/11/2018 FINDINGS: There is a right jugular central venous catheter with the tip projecting over the SVC. There is no focal parenchymal opacity. There is no pleural effusion or pneumothorax. The heart and mediastinal contours are unremarkable. There is evidence of prior TAVR. There is thoracic aortic atherosclerosis. The osseous structures are unremarkable. IMPRESSION: No active disease. Electronically Signed   By: Kathreen Devoid   On: 02/15/2018 16:25   Disposition   Pt is being discharged home today in good condition.  Follow-up Plans & Appointments    Follow-up Information    Burtis Junes, NP Follow up on 03/03/2018.   Specialties:  Nurse Practitioner, Interventional Cardiology, Cardiology, Radiology Why:  at  10am for your follow up appt.  Contact information: [place]. [address] [place] [address] [phone]          Discharge Instructions    Call MD for:  redness, tenderness, or signs of infection (pain, swelling, redness, odor or green/yellow discharge around incision site)   Complete by:  As directed    Diet - low sodium heart healthy   Complete by:  As directed    Discharge instructions   Complete by:  As directed    ACTIVITY AND EXERCISE . Daily activity and exercise are an important part of your recovery. People recover at different rates depending on their general health and type of valve procedure. . Most people require six to 10 weeks to feel recovered. . No lifting, pushing, pulling more than 10 pounds (examples to avoid: groceries, vacuuming, gardening, golfing):  - For one week with a procedure through the groin.  - For six weeks for procedures through the chest wall.  - For three months for procedures through the breast-bone. NOTE: You will typically see one of our providers 7-10 days after your procedure to discuss [place] the above activities.    DRIVING . Do not drive for four weeks after the date of your procedure. . If you have been told by your doctor in the past that you may not drive, you must talk with him/her before you begin driving again. . When you resume driving, you must have someone with you.   DRESSING . Groin site: you may leave the clear dressing over the site for up to one week or until it falls off.   HYGIENE . If you had a femoral (leg) procedure, you may take a shower when you return home. After the shower, pat the site dry. Do NOT use powder, oils or lotions in your groin area until the site has completely healed. . If you had a chest procedure, you may shower when you return home unless specifically instructed not to by your discharging practitioner.  - DO NOT scrub incision; pat dry with a towel  - DO NOT apply any  lotions, oils, powders to the incision  - No tub baths / swimming for at least six weeks. . If you notice any fevers, chills, increased pain, swelling, bleeding or pus, please contact your doctor.  ADDITIONAL INFORMATION . If you are going to have an upcoming dental procedure, please contact our office as you may require antibiotics ahead of time to prevent infection on your heart valve.   Increase activity slowly  Complete by:  As directed       Discharge Medications     Medication List    TAKE these medications   aspirin 81 MG tablet Take 81 mg by mouth at bedtime.   clopidogrel 75 MG tablet Commonly known as:  PLAVIX Take 1 tablet (75 mg total) by mouth daily with breakfast.   colestipol 1 g tablet Commonly known as:  COLESTID Take 1 g by mouth daily before lunch.   irbesartan 300 MG tablet Commonly known as:  AVAPRO Take 300 mg by mouth daily.   LIPITOR 80 MG tablet Generic drug:  atorvastatin Take 80 mg by mouth at bedtime.   metoprolol tartrate 25 MG tablet Commonly known as:  LOPRESSOR Take 0.5 tablets (12.5 mg total) by mouth 2 (two) times daily.   ONGLYZA 5 MG Tabs tablet Generic drug:  saxagliptin HCl Take 1 tablet (5 mg total) by mouth daily.         Outstanding Labs/Studies   F/u echo in one month.  Duration of Discharge Encounter   Greater than 30 minutes including physician time.  Signed, Reino Bellis NP-C 02/17/2018, 9:51 AM

## 2018-02-18 ENCOUNTER — Telehealth: Payer: Self-pay

## 2018-02-18 NOTE — Telephone Encounter (Signed)
This encounter was created in error - please disregard.

## 2018-02-18 NOTE — Telephone Encounter (Signed)
Patient contacted regarding discharge from [facility] on 02/17/2018.  Patient understands to follow up with provider Truitt Merle NP on 03/03/2018 at 10:00 AM at [facility] office. Patient understands discharge instructions? yes Patient understands medications and regiment? yes Patient understands to bring all medications to this visit? yes  The pt is also aware of apts on 03/31/18 for Echo and OV with Dr Burt Knack.   The pt does complain of swelling in her right hand from her wrist to fingers.  The pt denies warmth and redness in hand and no edema in right arm. The pt had 2 IVs located in her hand during hospital admission.  The pt is currently putting ice on her hand 15 min on/15 min off and the swelling has started to improve.  I advised her that she can continue with ice as needed and she can elevate hand to help with symptoms.  She will contact me if she develops warmth or redness in hand or extremity. She was also advised to contact me with any additional questions or concerns. Pt agreed with plan.

## 2018-02-21 ENCOUNTER — Encounter: Payer: Self-pay | Admitting: Thoracic Surgery (Cardiothoracic Vascular Surgery)

## 2018-02-21 NOTE — Consult Note (Signed)
            [facility] CM Primary Care Navigator  02/21/2018  [name] 05/18/1932 [id number]   Attempt to seepatient at the bedside to identify possible discharge needsbutshe was already dischargedhome.  Patient was seen and treated for severe aortic stenosis (status post transcatheter aortic valve replacement).  Primary care provider's officeis listed as providingtransition of care (TOC).   Patient has discharge instruction to follow-up with cardiology on 03/03/18.   For additional questions please contact:  Edwena Felty A. Nirvi Boehler, BSN, RN-BC [facility] PRIMARY CARE Navigator Cell: [phone]

## 2018-03-02 NOTE — Progress Notes (Addendum)
CARDIOLOGY OFFICE NOTE  Date:  03/03/2018    Mallory Thomas Date of Birth: 22-Mar-1932 Medical Record #[id number]  PCP:  Jani Gravel, MD  Cardiologist:  Lattie Corns   Chief Complaint  Patient presents with  . Cardiac Valve Problem    TOC/post TAVR visit - seen for Dr. Aida Puffer    History of Present Illness: Mallory Thomas is a 84 y.o. female who presents today for a TOC/post hospital visit - seen for Dr. Aida Puffer.  She has a history of aortic stenosis, hypertension, type 2 diabetes mellitus, hyperlipidemia, and history of rheumatic fever during childhood.   She notes that she had been told that she had a heart murmur ever since she had rheumatic fever at age 7. She has no other history of cardiac disease. She has been followed for the last several years by Dr. Einar Gip. Transthoracic echocardiograms have demonstrated the presence of normal left ventricular systolic function with aortic stenosis that has progressed in severity. Follow-up echocardiogram performed December 08, 2017 at Dr. Irven Shelling office reportedly revealed further significant progression of disease with severe aortic stenosis. Peak velocity across the aortic valve was reported 4.3 m/s corresponding to mean transvalvular gradient estimated 47 mmHg and aortic valve area calculated 0.65 cm. There was normal left ventricular systolic function with ejection fraction calculated 67%. No other significant abnormalities were noted. The patient underwent diagnostic cardiac catheterization by Dr. Einar Gip on December 24, 2017. Transvalvular gradient across the aortic valve was not assessed. There was single-vessel coronary artery disease with 85% discrete stenosis of the mid right coronary artery. There was nonobstructive disease in the left coronary distribution. Pulmonary artery pressures were normal. The patient was referred to the multidisciplinary heart valve clinic and evaluated by Dr. Cyndia Bent. CT  angiography was performed and the patient was evaluated by Dr. Burt Knack.The patient has been essentially asymptomatic until recently, with mild symptoms of shortness of breath with more strenuous physical exertion. She also reported occasional dizzy spells. The patient has not had symptoms consistent with angina pectoris, and transcatheter aortic valve replacement was discussed as an alternative to conventional surgical aortic valve replacement with or without coronary artery bypass grafting. The patient was deemed an appropriate candidate for TAVR and scheduled for surgery.   She underwent successful TAVR with Edwards Sapien 3 THV (size 23 mm) with Dr. Burt Knack and Dr. Roxy Manns.  Was noted be hypertensive post op and placed on nitro which was able to be weaned. Her home dose of Avapro was resumed. Had expected post op thrombocytopenia. Follow up echo showed elevated mean gradient of 22 mmHg, which was felt to be related to hyperdynamic LV function. Placed on ASA and Plavix. Blood pressures remained elevated, therefore she was started on metoprolol 12.5 mg BID.   Comes in today. Here alone. She feels "amazing". Says "it is a miracle". She feels great. Planning on going to the dentist - was not sure about antibiotics ro the timeing. Has some concern about her groin - says there is a ?stitch. She has no shortness of breath. Not dizzy anymore. No chest pain. She is already back driving - has been back to church. Has no real issue. No issues with bleeding/bruising from her DAPT.   Past Medical History:  Diagnosis Date  . Aortic stenosis   . Breast cancer ([place]) 2004   right  . Diabetes mellitus   . Heart murmur   . Hyperlipidemia   . Hypertension   . S/P TAVR (transcatheter  aortic valve replacement) 02/15/2018   23 mm Edwards Sapien 3 transcatheter heart valve placed via percutaneous right transfemoral approach     Past Surgical History:  Procedure Laterality Date  . BILATERAL KNEE ARTHROSCOPY    .  BREAST LUMPECTOMY Right 2004   radiation   . CARDIAC CATHETERIZATION     12/24/17  . INTRAVASCULAR ULTRASOUND/IVUS N/A 12/24/2017   Procedure: Intravascular Ultrasound/IVUS;  Surgeon: Adrian Prows, MD;  Location: [place] [facility];  Service: Cardiovascular;  Laterality: N/A;  . lymph node removal right arm    . PERIPHERAL VASCULAR CATHETERIZATION N/A 11/10/2016   Procedure: Lower Extremity Angiography;  Surgeon: Adrian Prows, MD;  Location: [place] [facility];  Service: Cardiovascular;  Laterality: N/A;  . PERIPHERAL VASCULAR CATHETERIZATION Right 11/10/2016   Procedure: Peripheral Vascular Intervention;  Surgeon: Adrian Prows, MD;  Location: [place] [facility];  Service: Cardiovascular;  Laterality: Right;  Rt Com Iliac  . TEE WITHOUT CARDIOVERSION N/A 02/15/2018   Procedure: TRANSESOPHAGEAL ECHOCARDIOGRAM (TEE);  Surgeon: Sherren Mocha, MD;  Location: [place];  Service: Open Heart Surgery;  Laterality: N/A;  . TONSILLECTOMY    . TRANSCATHETER AORTIC VALVE REPLACEMENT, TRANSFEMORAL N/A 02/15/2018   Procedure: TRANSCATHETER AORTIC VALVE REPLACEMENT, TRANSFEMORAL;  Surgeon: Sherren Mocha, MD;  Location: [place];  Service: Open Heart Surgery;  Laterality: N/A;     Medications: Current Meds  Medication Sig  . aspirin 81 MG tablet Take 81 mg by mouth at bedtime.   Marland Kitchen atorvastatin (LIPITOR) 80 MG tablet Take 80 mg by mouth at bedtime.   . clopidogrel (PLAVIX) 75 MG tablet Take 1 tablet (75 mg total) by mouth daily with breakfast.  . colestipol (COLESTID) 1 g tablet Take 1 g by mouth daily before lunch.   . glimepiride (AMARYL) 1 MG tablet Take 1 mg by mouth daily with breakfast.  . irbesartan (AVAPRO) 300 MG tablet Take 300 mg by mouth daily.   . metoprolol tartrate (LOPRESSOR) 25 MG tablet Take 0.5 tablets (12.5 mg total) by mouth 2 (two) times daily.  . saxagliptin HCl (ONGLYZA) 5 MG TABS tablet Take 1 tablet (5 mg total) by mouth daily.     Allergies: Allergies  Allergen Reactions  . Fish Allergy  Hives  . Iodine Hives    Allergic to ALL seafood, shrimp, crab etc.  . Shellfish Allergy Hives  . Tape Hives  . Zetia [Ezetimibe] Diarrhea    Social History: The patient  reports that she quit smoking about 49 years ago. She has never used smokeless tobacco. She reports that she does not use drugs.   Family History: The patient's family history is not on file.   Review of Systems: Please see the history of present illness.   Otherwise, the review of systems is positive for none.   All other systems are reviewed and negative.   Physical Exam: VS:  BP 136/68 (BP Location: Left Arm, Patient Position: Sitting, Cuff Size: Large)   Pulse 61   Ht 5' 2.5" (1.588 m)   Wt 184 lb 12.8 oz (83.8 kg)   BMI 33.26 kg/m  .  BMI Body mass index is 33.26 kg/m.  Wt Readings from Last 3 Encounters:  03/03/18 184 lb 12.8 oz (83.8 kg)  02/16/18 157 lb 10.1 oz (71.5 kg)  02/11/18 184 lb 12.8 oz (83.8 kg)    General: Pleasant. Well developed, well nourished and in no acute distress.  Looks younger than her stated age.  HEENT: Normal.  Neck: Supple, no JVD, carotid  bruits, or masses noted.  Cardiac: Regular rate and rhythm. Faint outflow murmur. No edema.  Respiratory:  Lungs are clear to auscultation bilaterally with normal work of breathing.  GI: Soft and nontender.  MS: No deformity or atrophy. Gait and ROM intact.  Skin: Warm and dry. Color is normal.  Neuro:  Strength and sensation are intact and no gross focal deficits noted.  Psych: Alert, appropriate and with normal affect.  Her right groin looks fine - there is glue/adhesive over the site. No signs of infection.    LABORATORY DATA:  EKG:  EKG is ordered today. This demonstrates .  Lab Results  Component Value Date   WBC 5.5 02/16/2018   HGB 9.4 (L) 02/16/2018   HCT 28.6 (L) 02/16/2018   PLT 109 (L) 02/16/2018   GLUCOSE 140 (H) 02/16/2018   CHOL 238 (H) 05/14/2009   TRIG 63 05/14/2009   HDL 73 05/14/2009   LDLCALC 152 (H)  05/14/2009   ALT 8 (L) 02/11/2018   AST 19 02/11/2018   NA 136 02/16/2018   K 4.1 02/16/2018   CL 107 02/16/2018   CREATININE 0.82 02/16/2018   BUN 17 02/16/2018   CO2 17 (L) 02/16/2018   INR 0.98 02/11/2018   HGBA1C 6.7 (H) 02/11/2018       BNP (last 3 results) Recent Labs    02/11/18 1207  BNP 92.3    ProBNP (last 3 results) No results for input(s): PROBNP in the last 8760 hours.   Other Studies Reviewed Today: TAVR: 02/15/18  Procedure:   Transcatheter Aortic Valve Replacement - PercutaneousRightTransfemoral Approach Edwards Sapien 3 THV (size 28mm, model # 9600TFX, serial # [id number])  Co-Surgeons:Michael Burt Knack, MD andClarence H. Roxy Manns, MD   Anesthesiologist:Ryan Roanna Banning, MD  Echocardiographer:Dalton Aundra Dubin, MD  Pre-operative Echo Findings: ? Severe aortic stenosis ? Normalleft ventricular systolic function  Post-operative Echo Findings: ? Noparavalvular leak ? Normalleft ventricular systolic function    Assessment/Plan:  1. Severe AS due to rheumatic heart disease - post TAVR - doing fantastic. Would hold on going to the dentist until 6 weeks post TAVR. Hold on getting in the pool as well until groin totally healed. Lab today. See back as planned next month with Dr. Burt Knack for visit and echo. Continues on DAPT with Plavix/aspirin.    2. HTN - BP looks better - no changes made today.   3. HLD - on statin therapy.   4. CAD - single vessel - no symptoms. Noted that this could be treated if symptoms develop.   Current medicines are reviewed with the patient today.  The patient does not have concerns regarding medicines other than what has been noted above.  The following changes have been made:  See above.  Labs/ tests ordered today include:    Orders Placed This Encounter  Procedures  . Basic metabolic panel  . CBC  . EKG 12-Lead     Disposition:   FU with  Dr. Burt Knack next month as planned. See Dr. Einar Gip as planned.    Patient is agreeable to this plan and will call if any problems develop in the interim.   SignedTruitt Merle, NP  03/03/2018 10:42 AM  [place] [address] [place], [place]  [address] Phone: [phone] Fax: [phone]

## 2018-03-03 ENCOUNTER — Encounter: Payer: Self-pay | Admitting: Nurse Practitioner

## 2018-03-03 ENCOUNTER — Ambulatory Visit: Payer: Medicare Other | Admitting: Nurse Practitioner

## 2018-03-03 VITALS — BP 136/68 | HR 61 | Ht 62.5 in | Wt 184.8 lb

## 2018-03-03 DIAGNOSIS — Z952 Presence of prosthetic heart valve: Secondary | ICD-10-CM | POA: Diagnosis not present

## 2018-03-03 NOTE — Patient Instructions (Addendum)
We will be checking the following labs today - BMET and CBC   Medication Instructions:    Continue with your current medicines.     Testing/Procedures To Be Arranged:  N/A  Follow-Up:   See Dr. Burt Knack as planned in May for visit and echo    Other Special Instructions:   Wait 6 weeks post TAVR to go to the dentist - you will need to take antibiotics   Hold off on going to the pool until your groin is completely healed and seen back by Dr. Burt Knack     If you need a refill on your cardiac medications before your next appointment, please call your pharmacy.   Call the [facility] office at [phone] if you have any questions, problems or concerns.

## 2018-03-04 ENCOUNTER — Other Ambulatory Visit: Payer: Self-pay | Admitting: *Deleted

## 2018-03-04 DIAGNOSIS — E875 Hyperkalemia: Secondary | ICD-10-CM

## 2018-03-04 LAB — CBC
Hematocrit: 33.6 % — ABNORMAL LOW (ref 34.0–46.6)
Hemoglobin: 11.3 g/dL (ref 11.1–15.9)
MCH: 31.4 pg (ref 26.6–33.0)
MCHC: 33.6 g/dL (ref 31.5–35.7)
MCV: 93 fL (ref 79–97)
Platelets: 209 10*3/uL (ref 150–379)
RBC: 3.6 x10E6/uL — ABNORMAL LOW (ref 3.77–5.28)
RDW: 14.1 % (ref 12.3–15.4)
WBC: 5.5 10*3/uL (ref 3.4–10.8)

## 2018-03-04 LAB — BASIC METABOLIC PANEL
BUN/Creatinine Ratio: 22 (ref 12–28)
BUN: 23 mg/dL (ref 8–27)
CO2: 22 mmol/L (ref 20–29)
Calcium: 8.9 mg/dL (ref 8.7–10.3)
Chloride: 106 mmol/L (ref 96–106)
Creatinine, Ser: 1.05 mg/dL — ABNORMAL HIGH (ref 0.57–1.00)
GFR calc Af Amer: 57 mL/min/{1.73_m2} — ABNORMAL LOW (ref >59)
GFR calc non Af Amer: 49 mL/min/{1.73_m2} — ABNORMAL LOW (ref >59)
Glucose: 93 mg/dL (ref 65–99)
Potassium: 5.6 mmol/L — ABNORMAL HIGH (ref 3.5–5.2)
Sodium: 141 mmol/L (ref 134–144)

## 2018-03-10 ENCOUNTER — Other Ambulatory Visit: Payer: Medicare Other | Admitting: *Deleted

## 2018-03-10 DIAGNOSIS — E875 Hyperkalemia: Secondary | ICD-10-CM | POA: Diagnosis not present

## 2018-03-10 LAB — BASIC METABOLIC PANEL
BUN/Creatinine Ratio: 24 (ref 12–28)
BUN: 24 mg/dL (ref 8–27)
CO2: 20 mmol/L (ref 20–29)
Calcium: 9 mg/dL (ref 8.7–10.3)
Chloride: 106 mmol/L (ref 96–106)
Creatinine, Ser: 1 mg/dL (ref 0.57–1.00)
GFR calc Af Amer: 60 mL/min/{1.73_m2} (ref >59)
GFR calc non Af Amer: 52 mL/min/{1.73_m2} — ABNORMAL LOW (ref >59)
Glucose: 118 mg/dL — ABNORMAL HIGH (ref 65–99)
Potassium: 5.1 mmol/L (ref 3.5–5.2)
Sodium: 141 mmol/L (ref 134–144)

## 2018-03-11 ENCOUNTER — Telehealth: Payer: Self-pay

## 2018-03-11 MED ORDER — AMOXICILLIN 500 MG PO TABS: ORAL_TABLET | ORAL | 11 refills | Status: DC

## 2018-03-11 NOTE — Telephone Encounter (Signed)
Patient called to report she has a dental cleaning next week and needs abx.  Per SBE protocol, called in Amoxil 2 g for patient to take 1 hour prior to dental visits. She was grateful for call and agrees with treatment plan.

## 2018-03-18 ENCOUNTER — Encounter: Payer: Self-pay | Admitting: Cardiology

## 2018-03-18 LAB — PROTIME-INR

## 2018-03-31 ENCOUNTER — Ambulatory Visit: Payer: Medicare Other | Admitting: Cardiovascular Disease

## 2018-03-31 ENCOUNTER — Encounter: Payer: Self-pay | Admitting: Cardiovascular Disease

## 2018-03-31 ENCOUNTER — Ambulatory Visit (HOSPITAL_COMMUNITY): Payer: Medicare Other | Attending: Cardiovascular Disease

## 2018-03-31 ENCOUNTER — Other Ambulatory Visit: Payer: Self-pay

## 2018-03-31 VITALS — BP 140/80 | HR 66 | Ht 62.0 in | Wt 184.8 lb

## 2018-03-31 DIAGNOSIS — E119 Type 2 diabetes mellitus without complications: Secondary | ICD-10-CM | POA: Insufficient documentation

## 2018-03-31 DIAGNOSIS — I129 Hypertensive chronic kidney disease with stage 1 through stage 4 chronic kidney disease, or unspecified chronic kidney disease: Secondary | ICD-10-CM | POA: Diagnosis not present

## 2018-03-31 DIAGNOSIS — I340 Nonrheumatic mitral (valve) insufficiency: Secondary | ICD-10-CM | POA: Insufficient documentation

## 2018-03-31 DIAGNOSIS — N183 Chronic kidney disease, stage 3 (moderate): Secondary | ICD-10-CM | POA: Insufficient documentation

## 2018-03-31 DIAGNOSIS — I739 Peripheral vascular disease, unspecified: Secondary | ICD-10-CM | POA: Insufficient documentation

## 2018-03-31 DIAGNOSIS — Z952 Presence of prosthetic heart valve: Secondary | ICD-10-CM

## 2018-03-31 DIAGNOSIS — E785 Hyperlipidemia, unspecified: Secondary | ICD-10-CM | POA: Insufficient documentation

## 2018-03-31 DIAGNOSIS — I350 Nonrheumatic aortic (valve) stenosis: Secondary | ICD-10-CM | POA: Insufficient documentation

## 2018-03-31 LAB — ECHOCARDIOGRAM COMPLETE
Height: 62 in
Weight: 2956.8 oz

## 2018-03-31 NOTE — Progress Notes (Signed)
Cardiology Office Note Date:  03/31/2018   ID:  Mallory Thomas, DOB 11-24-1931, MRN [id number]  PCP:  Jani Gravel, MD  Cardiologist:  Sherren Mocha, MD    Chief Complaint  Patient presents with  . Follow-up    30 day post-TAVR     History of Present Illness: Mallory Thomas is a 84 y.o. female who presents for 30 day TAVR follow-up. She developed severe aortic stenosis and underwent TAVR with a 23 mm Sapien 3 transcatheter heart valve 02/15/2018.  The patient's procedure was uncomplicated and she was discharged home after 48 hours.  The patient is here alone today.  She feels very well.  She denies any symptoms of chest pain, shortness of breath, or leg swelling.  She is back to all of her normal activities with no exertional symptoms.   Past Medical History:  Diagnosis Date  . Aortic stenosis   . Breast cancer ([place]) 2004   right  . Diabetes mellitus   . Heart murmur   . Hyperlipidemia   . Hypertension   . 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 BILATERAL KNEE ARTHROSCOPY    . BREAST LUMPECTOMY Right 2004   radiation   . CARDIAC CATHETERIZATION     12/24/17  . INTRAVASCULAR ULTRASOUND/IVUS N/A 12/24/2017   Procedure: Intravascular Ultrasound/IVUS;  Surgeon: Adrian Prows, MD;  Location: [facility];  Service: Cardiovascular;  Laterality: N/A;  . lymph node removal right arm    . PERIPHERAL VASCULAR CATHETERIZATION N/A 11/10/2016   Procedure: Lower Extremity Angiography;  Surgeon: Adrian Prows, MD;  Location: [facility];  Service: Cardiovascular;  Laterality: N/A;  . PERIPHERAL VASCULAR CATHETERIZATION Right 11/10/2016   Procedure: Peripheral Vascular Intervention;  Surgeon: Adrian Prows, MD;  Location: [facility];  Service: Cardiovascular;  Laterality: Right;  Rt Com  Iliac  . TEE WITHOUT CARDIOVERSION N/A 02/15/2018   Procedure: TRANSESOPHAGEAL ECHOCARDIOGRAM (TEE);  Surgeon: Sherren Mocha, MD;  Location: [facility];  Service: Open Heart Surgery;  Laterality: N/A;  . TONSILLECTOMY    . TRANSCATHETER AORTIC VALVE REPLACEMENT, TRANSFEMORAL N/A 02/15/2018   Procedure: TRANSCATHETER AORTIC VALVE REPLACEMENT, TRANSFEMORAL;  Surgeon: Sherren Mocha, MD;  Location: [facility];  Service: Open Heart Surgery;  Laterality: N/A;    Current Outpatient Medications  Medication Sig Dispense Refill  . aspirin 81 MG tablet Take 81 mg by mouth at bedtime.     Marland Kitchen atorvastatin (LIPITOR) 80 MG tablet Take 80 mg by mouth at bedtime.     . clopidogrel (PLAVIX) 75 MG tablet Take 1 tablet (75 mg total) by mouth daily with breakfast. 30 tablet 2  . colestipol (COLESTID) 1 g tablet Take 1 g by mouth daily before lunch.     . glimepiride (AMARYL) 1 MG tablet Take 1 mg by mouth daily with breakfast.    . irbesartan (AVAPRO) 300 MG tablet Take 300 mg by mouth daily.     . metoprolol tartrate (LOPRESSOR) 25 MG tablet Take 0.5 tablets (12.5 mg total) by mouth 2 (two) times daily. 60 tablet 1  . saxagliptin HCl (ONGLYZA) 5 MG TABS tablet Take 1 tablet (5 mg total) by mouth daily. 30 tablet    No current facility-administered medications for this visit.     Allergies:   Fish allergy; Iodine; Shellfish allergy; Tape;  and Zetia [ezetimibe]   Social History:  The patient  reports that she quit smoking about 49 years ago. She has never used smokeless tobacco. She reports that she does not use drugs.   Family History:  The patient's family history is not on file.    ROS:  Please see the history of present illness.  All other systems are reviewed and negative.    PHYSICAL EXAM: VS:  BP 140/80   Pulse 66   Ht 5\' 2"  (1.575 m)   Wt 184 lb 12.8 oz (83.8 kg)   SpO2 98%   BMI 33.80 kg/m  , BMI Body mass index is 33.8 kg/m. GEN: Well nourished, well developed, in no acute distress  HEENT: normal    Neck: no JVD, no masses. No carotid bruits Cardiac: RRR with grade 2/6 SEM at the RUSB, no diastolic murmur                Respiratory:  clear to auscultation bilaterally, normal work of breathing GI: soft, nontender, nondistended, + BS MS: no deformity or atrophy  Ext: no pretibial edema, pedal pulses 2+= bilaterally Skin: warm and dry, no rash Neuro:  Strength and sensation are intact Psych: euthymic mood, full affect  EKG:  EKG is not ordered today.  Recent Labs: 02/11/2018: ALT 8; B Natriuretic Peptide 92.3 02/16/2018: Magnesium 1.6 03/03/2018: Hemoglobin 11.3; Platelets 209 03/10/2018: BUN 24; Creatinine, Ser 1.00; Potassium 5.1; Sodium 141   Lipid Panel     Component Value Date/Time   CHOL 238 (H) 05/14/2009 0915   TRIG 63 05/14/2009 0915   HDL 73 05/14/2009 0915   CHOLHDL 3.3 05/14/2009 0915   VLDL 13 05/14/2009 0915   LDLCALC 152 (H) 05/14/2009 0915      Wt Readings from Last 3 Encounters:  03/31/18 184 lb 12.8 oz (83.8 kg)  03/03/18 184 lb 12.8 oz (83.8 kg)  02/16/18 157 lb 10.1 oz (71.5 kg)     Cardiac Studies Reviewed: The patient's echo is completed this morning.  The formal interpretation is pending.  I have personally reviewed her echo images which demonstrate normal LV systolic function and normal function of her transcatheter aortic valve with a mean gradient of 16 mmHg and no paravalvular regurgitation.  ASSESSMENT AND PLAN: Aortic valve disease status post TAVR: The patient is doing well with New York Heart Association functional class I symptoms.  Her echo is reviewed as above with normal function of the transcatheter aortic valve.  I reviewed SBE prophylaxis guidelines with her today.  She should continue on dual antiplatelet therapy with aspirin and clopidogrel for a period of 6 months.  She will have valve clinic follow-up arranged in 1 year.  She will continue to follow regularly with Dr. Einar Gip and she has an appointment to see him tomorrow.  Current  medicines are reviewed with the patient today.  The patient does not have concerns regarding medicines.  Labs/ tests ordered today include:  No orders of the defined types were placed in this encounter.   Disposition:   FU one year Valve Clinic  Signed, Sherren Mocha, MD  03/31/2018 12:36 PM    [address], [place],   [address] Phone: [phone]; Fax: [phone]

## 2018-03-31 NOTE — Patient Instructions (Signed)
Medication Instructions:  Your provider recommends that you continue on your current medications as directed. Please refer to the Current Medication list given to you today.    Labwork: None  Testing/Procedures: Your provider has requested that you have an echocardiogram. Echocardiography is a painless test that uses sound waves to create images of your heart. It provides your doctor with information about the size and shape of your heart and how well your heart's chambers and valves are working. This procedure takes approximately one hour. There are no restrictions for this procedure.  Follow-Up: Your provider wants you to follow-up in: 1 year with the Structural Heart Team. You will receive a reminder letter in the mail two months in advance. If you don't receive a letter, please call our office to schedule the follow-up appointment.    Any Other Special Instructions Will Be Listed Below (If Applicable).     If you need a refill on your cardiac medications before your next appointment, please call your pharmacy.

## 2018-04-01 DIAGNOSIS — Z952 Presence of prosthetic heart valve: Secondary | ICD-10-CM | POA: Diagnosis not present

## 2018-04-01 DIAGNOSIS — Z09 Encounter for follow-up examination after completed treatment for conditions other than malignant neoplasm: Secondary | ICD-10-CM | POA: Diagnosis not present

## 2018-04-01 DIAGNOSIS — I739 Peripheral vascular disease, unspecified: Secondary | ICD-10-CM | POA: Diagnosis not present

## 2018-04-01 DIAGNOSIS — I25118 Atherosclerotic heart disease of native coronary artery with other forms of angina pectoris: Secondary | ICD-10-CM | POA: Diagnosis not present

## 2018-04-20 ENCOUNTER — Other Ambulatory Visit: Payer: Self-pay | Admitting: Oncology

## 2018-04-20 DIAGNOSIS — Z1231 Encounter for screening mammogram for malignant neoplasm of breast: Secondary | ICD-10-CM

## 2018-04-20 DIAGNOSIS — E119 Type 2 diabetes mellitus without complications: Secondary | ICD-10-CM | POA: Diagnosis not present

## 2018-04-21 ENCOUNTER — Encounter: Payer: Self-pay | Admitting: Thoracic Surgery (Cardiothoracic Vascular Surgery)

## 2018-04-27 DIAGNOSIS — E7800 Pure hypercholesterolemia, unspecified: Secondary | ICD-10-CM | POA: Diagnosis not present

## 2018-04-27 DIAGNOSIS — E119 Type 2 diabetes mellitus without complications: Secondary | ICD-10-CM | POA: Diagnosis not present

## 2018-04-27 DIAGNOSIS — I10 Essential (primary) hypertension: Secondary | ICD-10-CM | POA: Diagnosis not present

## 2018-05-27 ENCOUNTER — Ambulatory Visit: Payer: Medicare Other

## 2018-06-29 ENCOUNTER — Ambulatory Visit
Admission: RE | Admit: 2018-06-29 | Discharge: 2018-06-29 | Disposition: A | Discharge status: Home / Self Care | Payer: Medicare Other | Source: Ambulatory Visit | Attending: Oncology | Admitting: Oncology

## 2018-06-29 DIAGNOSIS — Z1231 Encounter for screening mammogram for malignant neoplasm of breast: Secondary | ICD-10-CM | POA: Diagnosis not present

## 2018-06-30 ENCOUNTER — Inpatient Hospital Stay: Payer: Medicare Other | Attending: Oncology

## 2018-06-30 DIAGNOSIS — N183 Chronic kidney disease, stage 3 (moderate): Secondary | ICD-10-CM | POA: Insufficient documentation

## 2018-06-30 DIAGNOSIS — I25118 Atherosclerotic heart disease of native coronary artery with other forms of angina pectoris: Secondary | ICD-10-CM | POA: Diagnosis not present

## 2018-06-30 DIAGNOSIS — I10 Essential (primary) hypertension: Secondary | ICD-10-CM | POA: Insufficient documentation

## 2018-06-30 DIAGNOSIS — Z87891 Personal history of nicotine dependence: Secondary | ICD-10-CM | POA: Diagnosis not present

## 2018-06-30 DIAGNOSIS — C50911 Malignant neoplasm of unspecified site of right female breast: Secondary | ICD-10-CM

## 2018-06-30 DIAGNOSIS — E119 Type 2 diabetes mellitus without complications: Secondary | ICD-10-CM | POA: Insufficient documentation

## 2018-06-30 DIAGNOSIS — Z853 Personal history of malignant neoplasm of breast: Secondary | ICD-10-CM | POA: Diagnosis not present

## 2018-06-30 LAB — CBC WITH DIFFERENTIAL/PLATELET
Basophils Absolute: 0 10*3/uL (ref 0.0–0.1)
Basophils Relative: 1 %
Eosinophils Absolute: 0.2 10*3/uL (ref 0.0–0.5)
Eosinophils Relative: 5 %
HCT: 34.9 % (ref 34.8–46.6)
Hemoglobin: 11.3 g/dL — ABNORMAL LOW (ref 11.6–15.9)
Lymphocytes Relative: 25 %
Lymphs Abs: 1 10*3/uL (ref 0.9–3.3)
MCH: 30.8 pg (ref 25.1–34.0)
MCHC: 32.4 g/dL (ref 31.5–36.0)
MCV: 95.1 fL (ref 79.5–101.0)
Monocytes Absolute: 0.4 10*3/uL (ref 0.1–0.9)
Monocytes Relative: 11 %
Neutro Abs: 2.2 10*3/uL (ref 1.5–6.5)
Neutrophils Relative %: 58 %
Platelets: 128 10*3/uL — ABNORMAL LOW (ref 145–400)
RBC: 3.67 MIL/uL — ABNORMAL LOW (ref 3.70–5.45)
RDW: 15.1 % — ABNORMAL HIGH (ref 11.2–14.5)
WBC: 3.8 10*3/uL — ABNORMAL LOW (ref 3.9–10.3)

## 2018-06-30 LAB — COMPREHENSIVE METABOLIC PANEL
ALT: 6 U/L (ref 0–44)
AST: 15 U/L (ref 15–41)
Albumin: 4.1 g/dL (ref 3.5–5.0)
Alkaline Phosphatase: 45 U/L (ref 38–126)
Anion gap: 8 (ref 5–15)
BUN: 30 mg/dL — ABNORMAL HIGH (ref 8–23)
CO2: 23 mmol/L (ref 22–32)
Calcium: 8.8 mg/dL — ABNORMAL LOW (ref 8.9–10.3)
Chloride: 111 mmol/L (ref 98–111)
Creatinine, Ser: 1.08 mg/dL — ABNORMAL HIGH (ref 0.44–1.00)
GFR calc Af Amer: 53 mL/min — ABNORMAL LOW (ref >60)
GFR calc non Af Amer: 46 mL/min — ABNORMAL LOW (ref >60)
Glucose, Bld: 87 mg/dL (ref 70–99)
Potassium: 5.9 mmol/L — ABNORMAL HIGH (ref 3.5–5.1)
Sodium: 142 mmol/L (ref 135–145)
Total Bilirubin: 0.4 mg/dL (ref 0.3–1.2)
Total Protein: 6.9 g/dL (ref 6.5–8.1)

## 2018-07-04 ENCOUNTER — Other Ambulatory Visit: Payer: Self-pay | Admitting: Cardiology

## 2018-07-06 NOTE — Progress Notes (Signed)
ID: Mallory Thomas   DOB: 03-27-1932  MR#: [id number]  QHK#:[id number]  Patient Care Team: Jani Gravel, MD as PCP - General (Internal Medicine) Bassy Fetterly, Virgie Dad, MD (Hematology and Oncology) Audra Bellard, Virgie Dad, MD as Consulting Physician (Oncology) Clarene Essex, MD as Consulting Physician (Gastroenterology) Adrian Prows, MD as Consulting Physician (Cardiology) Sherren Mocha, MD as Consulting Physician (Cardiology)   CHIEF COMPLAINT: Right-sided breast cancer  CURRENT TREATMENT: Observation  HISTORY OF PRESENT ILLNESS: From the original intake note:  Screening mammogram at [facility] showed a suspicious lesion in her right breast.  She was then referred to the [facility] where ultrasound was performed and core needle biopsy was obtained.  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 Annamaria Boots for further evaluation.  On 04/30/03, she underwent right lumpectomy with axillary lymph node biopsy and the final pathology ([id number]) showed an infiltrating ductal carcinoma 0.9 cm on this study, with one sentinel lymph node negative for spread of disease.  Grade was 2.  There was no evidence of vascular or lymphatic invasion.  There was no extensive intraductal component.  Note that the patient's core biopsy which measured 1.2 cm, although that is not stated in the original pathology report, is being used as the "T" portion of the patient's staging record.  Her subsequent history is as detailed below.  INTERVAL HISTORY: Mallory Thomas returns today for follow-up of her remote estrogen receptor positive breast cancer. She continues under observation. She did well with her heart valve surgery.   Since her last visit, she underwent routine screening bilateral mammography with CAD and tomography on 06/29/2018 at [facility] showing: breast density category B. There was no evidence of malignancy.    Sine  her last visit, she was seen in the hospital on 12/24/2017 for severe aortic stenosis. A CT of the chest, abdomen, and pelvis on 01/10/2018 showed: Vascular findings and measurements pertinent to potential TAVR procedure. Severe thickening calcification of the aortic valve, compatible with patient's reported clinical history of severe aortic stenosis. Aortic atherosclerosis, in addition to left main and 3 vessel coronary artery disease. Large hiatal hernia. Cardiomegaly with mild left atrial dilatation. Mild colonic diverticulosis without evidence to suggest an acute diverticulitis at this time. Aortic Atherosclerosis (ICD10-I70.0).  She underwent transcatheter aortic valve replacement on 02/15/2018.  She had an entirely uncomplicated course and is delighted with the results.   REVIEW OF SYSTEMS: Mallory Thomas reports that she was glad to have her heart valve surgery. She denies unusual headaches, visual changes, nausea, vomiting, or dizziness. There has been no unusual cough, phlegm production, or pleurisy. There has been no change in bowel or bladder habits. She denies unexplained fatigue or unexplained weight loss, bleeding, rash, or fever. A detailed review of systems was otherwise stable.    PAST MEDICAL HISTORY: Past Medical History:  Diagnosis Date  . Aortic stenosis   . Breast cancer ([facility]) 2004   right  . Diabetes mellitus   . Heart murmur   . Hyperlipidemia   . Hypertension   . 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 BILATERAL KNEE ARTHROSCOPY    . BREAST LUMPECTOMY Right 2004   radiation   . CARDIAC  CATHETERIZATION     12/24/17  . INTRAVASCULAR ULTRASOUND/IVUS N/A 12/24/2017   Procedure: Intravascular  Ultrasound/IVUS;  Surgeon: Adrian Prows, MD;  Location: [facility];  Service: Cardiovascular;  Laterality: N/A;  . lymph node removal right arm    . PERIPHERAL VASCULAR CATHETERIZATION N/A 11/10/2016   Procedure: Lower Extremity Angiography;  Surgeon: Adrian Prows, MD;  Location: [facility];  Service: Cardiovascular;  Laterality: N/A;  . PERIPHERAL VASCULAR CATHETERIZATION Right 11/10/2016   Procedure: Peripheral Vascular Intervention;  Surgeon: Adrian Prows, MD;  Location: [facility];  Service: Cardiovascular;  Laterality: Right;  Rt Com Iliac  . TEE WITHOUT CARDIOVERSION N/A 02/15/2018   Procedure: TRANSESOPHAGEAL ECHOCARDIOGRAM (TEE);  Surgeon: Sherren Mocha, MD;  Location: [facility];  Service: Open Heart Surgery;  Laterality: N/A;  . TONSILLECTOMY    . TRANSCATHETER AORTIC VALVE REPLACEMENT, TRANSFEMORAL N/A 02/15/2018   Procedure: TRANSCATHETER AORTIC VALVE REPLACEMENT, TRANSFEMORAL;  Surgeon: Sherren Mocha, MD;  Location: [facility];  Service: Open Heart Surgery;  Laterality: N/A;    FAMILY HISTORY The patient's family were [place].  Her father died at the age of 13 from a heart attack.  Her mother died at age 20, and she had a breast cancer diagnosed at age 89.  She had a mastectomy and Tamoxifen at that time.   GYNECOLOGIC HISTORY: She is G3, F3, P0, A0, L3.  Menopause age 57.  She never took hormones.  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  Her husband died in the year 01/29/1999.  He had lung cancer and was taken care of by Dr. Ralene Ok.  Her children are Heidi (who lives in [place] and sells medications to labs); Shanon Brow (who lives in [place] and is in [employer]); and Medical sales representative (who lives in [place] and is currently a homemaker.).  The patient has three grandsons: Barnabas Lister, 73; Case, 14; and Nicholas; 8.  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  She lives alone with her cat  Barth Kirks   ADVANCED DIRECTIVES: In place  HEALTH MAINTENANCE: Social History   Tobacco Use  . Smoking status: Former Smoker    Last attempt to quit: 06/04/1968    Years since quitting: 50.1  . Smokeless tobacco: Never Used  Substance Use Topics  . Alcohol use: Not on file    Comment: occasional  . Drug use: No     Colonoscopy: 11/01/2015/i Magodin the assessment and however you and seeing Berta Minor today and she was in 25 and have a unblinded that she keeps asking me when she took 5 cancer can will I am seeing her molars almost it's that extra little bit of that field with  PAP:  Bone density: 01/28/2013  Lipid panel:  Allergies  Allergen Reactions  . Fish Allergy Hives  . Iodine Hives    Allergic to ALL seafood, shrimp, crab etc.  . Shellfish Allergy Hives  . Tape Hives  . Zetia [Ezetimibe] Diarrhea    Current Outpatient Medications  Medication Sig Dispense Refill  . aspirin 81 MG tablet Take 81 mg by mouth at bedtime.     Marland Kitchen atorvastatin (LIPITOR) 80 MG tablet Take 80 mg by mouth at bedtime.     . clopidogrel (PLAVIX) 75 MG tablet Take 1 tablet (75 mg total) by mouth daily with breakfast. 30 tablet 2  . colestipol (COLESTID) 1 g tablet Take 1 g by mouth daily  before lunch.     . glimepiride (AMARYL) 1 MG tablet Take 1 mg by mouth daily with breakfast.    . irbesartan (AVAPRO) 300 MG tablet Take 300 mg by mouth daily.     . metoprolol tartrate (LOPRESSOR) 25 MG tablet Take 0.5 tablets (12.5 mg total) by mouth 2 (two) times daily. 90 tablet 1  . saxagliptin HCl (ONGLYZA) 5 MG TABS tablet Take 1 tablet (5 mg total) by mouth daily. 30 tablet    No current facility-administered medications for this visit.     OBJECTIVE: Older white woman in no acute distress  Vitals:   07/07/18 1306  BP: 137/66  Pulse: 72  Resp: 17  Temp: 97.9 F (36.6 C)  SpO2: 99%     Body mass index is 34.4 kg/m.    ECOG FS: 0  Sclerae unicteric, pupils round and equal Oropharynx clear and  moist No cervical or supraclavicular adenopathy Lungs no rales or rhonchi Heart regular rate and rhythm Abd soft, obese, nontender, positive bowel sounds MSK no focal spinal tenderness, no upper extremity lymphedema Neuro: nonfocal, well oriented, appropriate affect Breasts: The right breast is status post lumpectomy.  There is no evidence of local recurrence.  The left breast is unremarkable.  Both axillae are benign.  LAB RESULTS: Lab Results  Component Value Date   WBC 3.8 (L) 06/30/2018   NEUTROABS 2.2 06/30/2018   HGB 11.3 (L) 06/30/2018   HCT 34.9 06/30/2018   MCV 95.1 06/30/2018   PLT 128 (L) 06/30/2018      Chemistry      Component Value Date/Time   NA 142 06/30/2018 1249   NA 141 03/10/2018 0937   NA 141 06/24/2017 1333   K 5.9 (H) 06/30/2018 1249   K 4.8 06/24/2017 1333   CL 111 06/30/2018 1249   CO2 23 06/30/2018 1249   CO2 26 06/24/2017 1333   BUN 30 (H) 06/30/2018 1249   BUN 24 03/10/2018 0937   BUN 25.6 06/24/2017 1333   CREATININE 1.08 (H) 06/30/2018 1249   CREATININE 1.3 (H) 06/24/2017 1333      Component Value Date/Time   CALCIUM 8.8 (L) 06/30/2018 1249   CALCIUM 9.1 06/24/2017 1333   ALKPHOS 45 06/30/2018 1249   ALKPHOS 50 06/24/2017 1333   AST 15 06/30/2018 1249   AST 15 06/24/2017 1333   ALT 6 06/30/2018 1249   ALT 7 06/24/2017 1333   BILITOT 0.4 06/30/2018 1249   BILITOT 0.43 06/24/2017 1333       Lab Results  Component Value Date   LABCA2 13 05/17/2008    No components found for: [id number]  No results for input(s): INR in the last 168 hours.  Urinalysis    Component Value Date/Time   COLORURINE STRAW (A) 02/11/2018 1208    STUDIES: Mm Digital Screening Bilateral  Result Date: 06/29/2018 CLINICAL DATA:  Screening. EXAM: DIGITAL SCREENING BILATERAL MAMMOGRAM WITH CAD COMPARISON:  Previous exam(s). ACR Breast Density Category b: There are scattered areas of fibroglandular density. FINDINGS: There are no findings suspicious for  malignancy. Stable postsurgical changes on the right. Images were processed with CAD. IMPRESSION: No mammographic evidence of malignancy. A result letter of this screening mammogram will be mailed directly to the patient. RECOMMENDATION: Screening mammogram in one year. (Code:[id number]-B-01Y) BI-RADS CATEGORY  2: Benign. Electronically Signed   By: Lajean Manes M.D.   On: 06/29/2018 08:07      ASSESSMENT: 84 y.o. [place] woman status post right lumpectomy and sentinel lymph node  biopsy June 2004 for a 1.2 cm estrogen and progesterone receptor positive, HER2/neu negative, grade 2 invasive ductal carcinoma, lymph node negative,   (1) on anastrozole between July 2004 and July 2009 through the MA27 study, note that the study still is unblinded so we do not know if she took 5 or 10 years of aromatase inhibitors  (2) enrolled in the B42 study as of July 2009 and received either placebo or letrozole.   (3) normal bone density 09/13/2013 at [facility].  (4) CKD stage II-III  PLAN:   Mallory Thomas is now 15 years out from definitive surgery for her breast cancer with no evidence of disease recurrence.  This is very favorable.  She did very well with her valve replacement and is singing the praises of the cardiology group.  We reviewed her mammogram which is very favorable with the breast density of B.  She will return to see me in 1 year.  She knows to call if any other issues that may develop before then.  Bretton Tandy, Virgie Dad, MD  07/07/18 1:26 PM Medical Oncology and Hematology [facility] [address] [place], [place] [address] Tel. [phone]    Fax. [phone]  Alice Rieger, am acting as scribe for Chauncey Cruel MD.  I, Lurline Del MD, have reviewed the above documentation for accuracy and completeness, and I agree with the above.

## 2018-07-07 ENCOUNTER — Inpatient Hospital Stay: Payer: Medicare Other | Admitting: Oncology

## 2018-07-07 ENCOUNTER — Telehealth: Payer: Self-pay | Admitting: Oncology

## 2018-07-07 VITALS — BP 137/66 | HR 72 | Temp 97.9°F | Resp 17 | Ht 62.0 in | Wt 188.1 lb

## 2018-07-07 DIAGNOSIS — I10 Essential (primary) hypertension: Secondary | ICD-10-CM

## 2018-07-07 DIAGNOSIS — E119 Type 2 diabetes mellitus without complications: Secondary | ICD-10-CM | POA: Diagnosis not present

## 2018-07-07 DIAGNOSIS — N183 Chronic kidney disease, stage 3 unspecified: Secondary | ICD-10-CM

## 2018-07-07 DIAGNOSIS — Z853 Personal history of malignant neoplasm of breast: Secondary | ICD-10-CM

## 2018-07-07 DIAGNOSIS — Z87891 Personal history of nicotine dependence: Secondary | ICD-10-CM | POA: Diagnosis not present

## 2018-07-07 DIAGNOSIS — C50811 Malignant neoplasm of overlapping sites of right female breast: Secondary | ICD-10-CM

## 2018-07-07 DIAGNOSIS — I25118 Atherosclerotic heart disease of native coronary artery with other forms of angina pectoris: Secondary | ICD-10-CM

## 2018-07-07 DIAGNOSIS — Z170 Estrogen receptor positive status [ER+]: Secondary | ICD-10-CM

## 2018-07-07 MED ORDER — HYDROCHLOROTHIAZIDE 12.5 MG PO CAPS: 12.5000 mg | ORAL_CAPSULE | Freq: Every day | ORAL | 6 refills | Status: DC

## 2018-07-07 NOTE — Telephone Encounter (Signed)
Gave avs and calendar ° °

## 2018-07-22 DIAGNOSIS — E7800 Pure hypercholesterolemia, unspecified: Secondary | ICD-10-CM | POA: Diagnosis not present

## 2018-07-22 DIAGNOSIS — E119 Type 2 diabetes mellitus without complications: Secondary | ICD-10-CM | POA: Diagnosis not present

## 2018-07-28 DIAGNOSIS — I10 Essential (primary) hypertension: Secondary | ICD-10-CM | POA: Diagnosis not present

## 2018-07-28 DIAGNOSIS — E119 Type 2 diabetes mellitus without complications: Secondary | ICD-10-CM | POA: Diagnosis not present

## 2018-07-28 DIAGNOSIS — E7800 Pure hypercholesterolemia, unspecified: Secondary | ICD-10-CM | POA: Diagnosis not present

## 2018-10-05 DIAGNOSIS — I25118 Atherosclerotic heart disease of native coronary artery with other forms of angina pectoris: Secondary | ICD-10-CM | POA: Diagnosis not present

## 2018-10-05 DIAGNOSIS — Z952 Presence of prosthetic heart valve: Secondary | ICD-10-CM | POA: Diagnosis not present

## 2018-10-05 DIAGNOSIS — Z298 Encounter for other specified prophylactic measures: Secondary | ICD-10-CM | POA: Diagnosis not present

## 2018-10-05 DIAGNOSIS — I739 Peripheral vascular disease, unspecified: Secondary | ICD-10-CM | POA: Diagnosis not present

## 2018-10-06 DIAGNOSIS — I25118 Atherosclerotic heart disease of native coronary artery with other forms of angina pectoris: Secondary | ICD-10-CM | POA: Diagnosis not present

## 2018-10-06 DIAGNOSIS — E1159 Type 2 diabetes mellitus with other circulatory complications: Secondary | ICD-10-CM | POA: Diagnosis not present

## 2018-10-06 DIAGNOSIS — E7800 Pure hypercholesterolemia, unspecified: Secondary | ICD-10-CM | POA: Diagnosis not present

## 2018-10-27 DIAGNOSIS — H26491 Other secondary cataract, right eye: Secondary | ICD-10-CM | POA: Diagnosis not present

## 2018-10-27 DIAGNOSIS — E119 Type 2 diabetes mellitus without complications: Secondary | ICD-10-CM | POA: Diagnosis not present

## 2018-10-27 DIAGNOSIS — H52203 Unspecified astigmatism, bilateral: Secondary | ICD-10-CM | POA: Diagnosis not present

## 2018-10-27 DIAGNOSIS — H5203 Hypermetropia, bilateral: Secondary | ICD-10-CM | POA: Diagnosis not present

## 2018-10-27 DIAGNOSIS — I739 Peripheral vascular disease, unspecified: Secondary | ICD-10-CM | POA: Diagnosis not present

## 2018-10-27 DIAGNOSIS — E7800 Pure hypercholesterolemia, unspecified: Secondary | ICD-10-CM | POA: Diagnosis not present

## 2018-10-27 DIAGNOSIS — H524 Presbyopia: Secondary | ICD-10-CM | POA: Diagnosis not present

## 2018-11-02 DIAGNOSIS — Z0000 Encounter for general adult medical examination without abnormal findings: Secondary | ICD-10-CM | POA: Diagnosis not present

## 2018-11-02 DIAGNOSIS — E7800 Pure hypercholesterolemia, unspecified: Secondary | ICD-10-CM | POA: Diagnosis not present

## 2018-11-02 DIAGNOSIS — E119 Type 2 diabetes mellitus without complications: Secondary | ICD-10-CM | POA: Diagnosis not present

## 2018-11-02 DIAGNOSIS — I10 Essential (primary) hypertension: Secondary | ICD-10-CM | POA: Diagnosis not present

## 2018-11-04 DIAGNOSIS — I739 Peripheral vascular disease, unspecified: Secondary | ICD-10-CM | POA: Diagnosis not present

## 2018-11-04 DIAGNOSIS — I25118 Atherosclerotic heart disease of native coronary artery with other forms of angina pectoris: Secondary | ICD-10-CM | POA: Diagnosis not present

## 2018-11-04 DIAGNOSIS — Z298 Encounter for other specified prophylactic measures: Secondary | ICD-10-CM | POA: Diagnosis not present

## 2018-11-04 DIAGNOSIS — Z952 Presence of prosthetic heart valve: Secondary | ICD-10-CM | POA: Diagnosis not present

## 2018-11-14 DIAGNOSIS — I10 Essential (primary) hypertension: Secondary | ICD-10-CM | POA: Diagnosis not present

## 2018-11-14 DIAGNOSIS — E7800 Pure hypercholesterolemia, unspecified: Secondary | ICD-10-CM | POA: Diagnosis not present

## 2018-11-14 DIAGNOSIS — E1159 Type 2 diabetes mellitus with other circulatory complications: Secondary | ICD-10-CM | POA: Diagnosis not present

## 2018-11-14 DIAGNOSIS — I25118 Atherosclerotic heart disease of native coronary artery with other forms of angina pectoris: Secondary | ICD-10-CM | POA: Diagnosis not present

## 2018-11-14 DIAGNOSIS — E119 Type 2 diabetes mellitus without complications: Secondary | ICD-10-CM | POA: Diagnosis not present

## 2018-11-18 ENCOUNTER — Telehealth: Payer: Self-pay

## 2018-11-18 DIAGNOSIS — Z952 Presence of prosthetic heart valve: Secondary | ICD-10-CM

## 2018-11-18 NOTE — Telephone Encounter (Signed)
TAVR completed 02/15/2018. Scheduled patient for 1 year echo and subsequent office visit with Nell Range, PA on Wednesday, February 08, 2019. She understands to arrive at 1230 for both visits. She was grateful for call and agrees with treatment plan.

## 2018-11-18 NOTE — Telephone Encounter (Signed)
-----   Message from Theodoro Parma, RN sent at 04/05/2018 10:44 AM EDT ----- Regarding: 1 year TAVR KT ov and echo same day (TAVR 02/15/18) 1 year TAVR KT ov and echo same day (TAVR 02/15/18) Due 02/16/19

## 2018-11-20 NOTE — H&P (Signed)
OFFICE VISIT NOTES COPIED TO EPIC FOR DOCUMENTATION  . History of Present Illness Mallory Page MD; 11/04/2018 10:38 AM) Patient words: f/u last OV 10-05-18, tests.  The patient is a 85 year old female who presents for a Follow-up for Leg claudication.  Additional reasons for visit:  Valvular heart disease is described as the following: Mallory Thomas is a pleasant Caucasian female with history of breast cancer status post radiation therapy he and lumpectomy on the right breast in 2000 for and hypertension, diabetes mellitus, hyperlipidemia, peripheral arterial disease with right iliac artery stenting in 2017, coronary artery disease, asymptomatic by angiography on 12/24/2017, critical aortic stenosis underwent TAVR on 02/15/2018.  Patient was seen by me about a month ago, she complained of worsening symptoms of claudication in bilateral buttocks and also hips, class III symptoms of claudication, in spite of being on aggressive medical therapy.  Aortic stenosis is described as the following: Mallory Thomas is a pleasant Caucasian female with history of breast cancer status post radiation therapy he and lumpectomy on the right breast in 2000 for and hypertension, diabetes mellitus, hyperlipidemia, peripheral arterial disease with right iliac artery stenting in 2017, coronary artery disease, asymptomatic by angiography on 12/24/2017, critical aortic stenosis underwent TAVR on 02/15/2018 and presents here for follow-up.  She underwent TAVR in April 2019, since then her symptoms of dyspnea and dizziness has completely resolved. She did not have any complications from the procedure. Denies claudication or chest pain. Has resumed all activity. Has continued to exercise regularly.   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 Right renal superior pole 95% stenosis. Right CIA 80% to 0% with 10x30 mm self expanding Absolute Pro. Left mild disease. Abdominal aortic duplex 10/27/2018: Normal abdominal aorta. No evidence of aneurysm. Peak systolic velocities in the mid and distal aorta are moderately increased to 245.72 cm/s. suggests > 50% stenosis. Peak systolic velocities in the right internal iliac artery are severely increased to 303.74 cm/s. Peak systolic velocities in the left internal iliac artery are moderately increased to 259.37 cm/s. suggestive of >50% stenosis. Compared to 06/16/2017, previously normal velocity. Consider further work up if clinically indicated. Bilateral carotid bruits (R09.89)  Carotid duplex 01/03/2018: Minimal stenosis in the right and left internal carotid artery (1-15%). Minimal stenosis in the left common carotid artery (<50%). Follow up in one year is appropriate if clinically indicated. No significant change from 05/10/2013. BMI 32.0-32.9,adult (Z68.32)  Laboratory examination  04/20/2018: Creatinine 0.94, EGFR 56, potassium 4.9, CMP normal. Hemoglobin A1c 6.5%. Cholesterol 228, triglycerides 104, HDL 64, LDL 143. 01/19/2018: WBC 4, RBC 3.8, hemoglobin normal, hematocrit 36.9, CBC otherwise normal. Creatinine 1.04, potassium 4.5, EGFR 50, CMP otherwise normal. Hemoglobin A1c 7.5%. Cholesterol 233, triglycerides 72, HDL 80, LDL 139. Coronary artery disease involving native coronary artery of native heart with other form of angina pectoris (I25.118)  Coronary angiogram 12/24/2017: Severe diffuse coronary calcification. RCA ostial 70-80% and mid 80% by IVUS. Mild disease Cx & LAD. EKG 10/05/2018: Normal sinus rhythm at rate of 67 bpm, normal axis. No evidence of ischemia, normal EKG. SBE (subacute bacterial endocarditis) prophylaxis candidate (Z29.8)  TAVR 02/15/18 S/P TAVR (transcatheter aortic valve  replacement) (Z95.2) [02/15/2018]: TAVR 02/15/2018: Edwards Sapien 3 THV (size 23 mm, model # 9600TFX, serial # [id number]) Echocardiogram 03/31/2018: Normal LV systolic function, moderate  LVH, LVEF 65-70%. Grade 2 diastolic dysfunction. Bioprosthetic aortic valve was present at mild mitral regurgitation.  Allergies (Mallory Thomas; 09-Nov-2018 9:41 AM) Shellfish  Hives. Seafood  Hives. Adhesive Bandages *MEDICAL DEVICES AND SUPPLIES*  Rash.  Family History Mallory Thomas; 11-09-2018 9:41 AM) Mother  Deceased. at age 53 from an MI Father  Deceased. at age 54 from an MI Siblings  1 sibling died from lung cancer  Social History Mallory Thomas; 11/09/18 9:41 AM) Current tobacco use  Former smoker. quit smoking 85 years old Alcohol Use  Occasional alcohol use. rarely Marital status  Widowed. Living Situation  lives alone Number of Children  3.  Past Surgical History Mallory Thomas; November 09, 2018 9:41 AM) Arm surgery [1986]: Lumpectomy [2004]: Right. Arthroscopic Knee Surgery - Both [2013]: TAVR [02/15/2018]:  Medication History Mallory Page, MD; November 09, 2018 10:31 AM) Mallory Thomas ('150MG'$ /ML Soln Auto-inj, 1 (one) Solution Pen-injector Subcutaneous every two weeks, Taken starting 10/27/2018) Active. (Approved until 04/11/2019) Aspirin ('81MG'$  Tablet, 1 Oral at bedtime) Active. Amoxicillin ('500MG'$  Capsule, Oral prior to dental) Active. hydroCHLOROthiazide (12.'5MG'$  Capsule, 1 capsule Oral daily) Active. Onglyza ('5MG'$  Tablet, 1 Oral daily) Active. Irbesartan ('300MG'$  Tablet, 1 Oral daily) Active. Metoprolol Tartrate ('25MG'$  Tablet, 1/2 tab Oral two times daily) Active. Clopidogrel Bisulfate ('75MG'$  Tablet, 1 (one) Tablet Oral daily, Taken starting 01/27/2018) Active. Atorvastatin Calcium ('80MG'$  Tablet, 1 Oral daily) Active. Glimepiride ('1MG'$  Tablet, 1 Oral daily) Active. Colestipol HCl (1GM Tablet, 1 Oral 3 times a day) Active. (before each meal) Medications Reconciled  (pictures on phone, verbally)  Diagnostic Studies History Mallory Thomas; 11-09-2018 9:42 AM) Abdominal Ultrasound  Abdominal aortic duplex 10/27/2018: Normal abdominal aorta. No evidence of aneurysm. Peak systolic velocities in the mid and distal aorta are moderately increased to 245.72 cm/s. suggests > 50% stenosis. Peak systolic velocities in the right internal iliac artery are severely increased to 303.74 cm/s. Peak systolic velocities in the left internal iliac artery are moderately increased to 259.37 cm/s. suggestive of >50% stenosis. Compared to 06/16/2017, previously normal velocity. Consider further work up if clinically indicated    Review of Systems Mallory Page MD; 2018/11/09 10:31 AM) General Present- Feeling well. Not Present- Fatigue, Fever and Night Sweats. Skin Not Present- Itching and Rash. HEENT Not Present- Headache. Respiratory Not Present- Difficulty Breathing, Wakes up from Sleep Wheezing or Short of Breath and Wheezing. Cardiovascular Present- Claudications. Not Present- Difficulty Breathing On Exertion, Edema, Fainting, Leg Cramps and Orthopnea. Gastrointestinal Not Present- Black, Tarry Stool, Constipation, Dysphagia, Nausea and Vomiting. Musculoskeletal Present- Joint Pain. Not Present- Claudication and Joint Swelling. Neurological Not Present- Dizziness and Headaches. Psychiatric Not Present- Anxiety and Insomnia. Endocrine Not Present- Excessive Sweating, Polydipsia and Polyuria. Hematology Not Present- Blood Clots, Easy Bruising and Nose Bleed.  Vitals Mallory Thomas; 11-09-2018 9:49 AM) 09-Nov-2018 9:44 AM Weight: 185.5 lb Height: 62.5in Body Surface Area: 1.86 m Body Mass Index: 33.39 kg/m  Pulse: 70 (Regular)  P.OX: 100% (Room air) BP: 136/60 (Sitting, Left Arm, Large)   Physical Exam Mallory Page, MD; 11/09/18 10:31 AM) General Mental Status-Alert. General Appearance-Cooperative, Appears stated age, Not in acute  distress. Orientation-Oriented X3. Build & Nutrition-Well built and Mildly obese.  Head and Neck Thyroid Gland Characteristics - no palpable nodules, no palpable enlargement.  Chest and Lung Exam Chest and lung exam reveals -quiet, even and easy respiratory effort with no use of accessory muscles and on auscultation, normal breath sounds, no adventitious sounds.  Cardiovascular Inspection Jugular vein - Right - No Distention. Auscultation Rhythm - Regular. Heart Sounds -  Normal heart sounds. Murmurs & Other Heart Sounds: Murmur - Location - Aortic Area. Timing - Early systolic. Grade - I/VI.  Abdomen Palpation/Percussion Normal exam - Non Tender and No hepatosplenomegaly. Auscultation Normal exam - Bowel sounds normal.  Peripheral Vascular Lower Extremity Inspection - Left - No Pigmentation, No Varicose veins. Right - No Pigmentation, No Varicose veins. Palpation - Edema - Bilateral - No edema. Femoral pulse - Left - 1+. Right - Absent. Popliteal pulse - Bilateral - 1+. Dorsalis pedis pulse - Bilateral - Absent. Posterior tibial pulse - Bilateral - Absent. Carotid arteries - Bilateral-Soft Bruit. Abdomen-No prominent abdominal aortic pulsation, No epigastric bruit.  Neurologic Motor-Grossly intact without any focal deficits.  Musculoskeletal - Did not examine.    Assessment & Plan Mallory Page MD; 11/04/2018 10:39 AM) S/P TAVR (transcatheter aortic valve replacement) (Z95.2) Story: TAVR 02/15/2018: Edwards Sapien 3 THV (size 23 mm, model # 9600TFX, serial # [id number])  Echocardiogram 03/31/2018: Normal LV systolic function, moderate LVH, LVEF 65-70%. Grade 2 diastolic dysfunction. Bioprosthetic aortic valve was present at mild mitral regurgitation. Coronary artery disease involving native coronary artery of native heart with other form of angina pectoris (I25.118) Story: Coronary angiogram 12/24/2017: Severe diffuse coronary calcification. RCA ostial 70-80%  and mid 80% by IVUS. Mild disease Cx & LAD.  EKG 10/05/2018: Normal sinus rhythm at rate of 67 bpm, normal axis. No evidence of ischemia, normal EKG.  Peripheral vascular disorder (I73.9) Story: Peripheral arteriogram 11/10/2016: Distal aorta diffuse 30% stenosis. Right renal superior pole 95% stenosis. Right CIA 80% to 0% with 10x30 mm self expanding Absolute Pro. Left mild disease.  Abdominal aortic duplex 10/27/2018: Normal abdominal aorta. No evidence of aneurysm. Peak systolic velocities in the mid and distal aorta are moderately increased to 245.72 cm/s. suggests > 50% stenosis. Peak systolic velocities in the right internal iliac artery are severely increased to 303.74 cm/s. Peak systolic velocities in the left internal iliac artery are moderately increased to 259.37 cm/s. suggestive of >50% stenosis. Compared to 06/16/2017, previously normal velocity. Consider further work up if clinically indicated. SBE (subacute bacterial endocarditis) prophylaxis candidate (Z29.8) Story: TAVR 02/15/18 Hypercholesteremia (E78.00)  Continued Praluent '150MG'$ /ML, 1 (one) Solution Pen-injector Solution Pen-injector every two weeks, 12 Syringe, 11/04/2018, Ref. x2, Mail Order 12 Pre-filled Pen Syringe, 90 days, Ref. x2. 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  Glucose 112, creatinine 1.13, EGFR 45/52, potassium 5.3, CMP otherwise normal.  TSH normal at 3.8.  Cholesterol 176, triglycerides 74, HDL 76, LDL 85.  Hemoglobin A1c 6.2%. 04/20/2018: Creatinine 0.94, EGFR 56, potassium 4.9, CMP normal. Hemoglobin A1c 6.5%. Cholesterol 228, triglycerides 104, HDL 64, LDL 143.  01/19/2018: WBC 4, RBC 3.8, hemoglobin normal, hematocrit 36.9, CBC  otherwise normal. Creatinine 1.04, potassium 4.5, EGFR 50, CMP otherwise normal. Hemoglobin A1c 7.5%. Cholesterol 233, triglycerides 72, HDL 80, LDL 139.   Recommendations:  Ms. Mallory Thomas is a pleasant Caucasian female with history of breast cancer status post radiation therapy he and lumpectomy on the right breast in 2000 for and hypertension, diabetes mellitus, hyperlipidemia, peripheral arterial disease with right iliac artery stenting in 2017, coronary artery disease, asymptomatic with high-grade percent stenosis in the large RCA asymptomatic by angiography on 12/24/2017, critical aortic stenosis underwent TAVR on 02/15/2018 and presents here for 6 month follow-up.  Patient  was seen by me about a month ago, she complained of worsening symptoms of claudication in bilateral buttocks and also hips,  I reviewed the results of the recently performed duplex which clearly reveals not only bilateral iliac artery stenosis but also distal abdominal aortic stenosis. Due to her class III symptoms of claudication, in spite of being on aggressive medical therapy, she will benefit from peripheral angiography. She is aware of the risks and benefits, she in fact had come prepared to set up to date due to symptoms being severe. I'll see her back after the procedure for follow-up.  With regard to hyperlipidemia, she is now on Praluent and I suspect this will improve the outcomes with regard to both PID and also CAD. She has been scheduled for labs that were previously ordered including A1c, lipid profile testing, TSH CMP and CBC in view of starting PCSK 9 inhibitors.  CC: Baldemar Friday, NP (in the absence of Georges Mouse)   Signed by Mallory Page, MD (11/04/2018 10:39 AM)

## 2018-11-22 ENCOUNTER — Other Ambulatory Visit: Payer: Self-pay

## 2018-11-22 ENCOUNTER — Encounter (HOSPITAL_COMMUNITY): Payer: Self-pay | Admitting: Cardiology

## 2018-11-22 ENCOUNTER — Encounter (HOSPITAL_COMMUNITY)
Admission: RE | Disposition: A | Discharge status: Home / Self Care | Payer: Self-pay | Source: Home / Self Care | Attending: Cardiology

## 2018-11-22 ENCOUNTER — Ambulatory Visit (HOSPITAL_COMMUNITY)
Admission: RE | Admit: 2018-11-22 | Discharge: 2018-11-22 | Disposition: A | Discharge status: Home / Self Care | Payer: Medicare Other | Attending: Cardiology | Admitting: Cardiology

## 2018-11-22 DIAGNOSIS — I70212 Atherosclerosis of native arteries of extremities with intermittent claudication, left leg: Secondary | ICD-10-CM | POA: Diagnosis not present

## 2018-11-22 DIAGNOSIS — I700 Atherosclerosis of aorta: Secondary | ICD-10-CM | POA: Insufficient documentation

## 2018-11-22 DIAGNOSIS — Z87891 Personal history of nicotine dependence: Secondary | ICD-10-CM | POA: Diagnosis not present

## 2018-11-22 DIAGNOSIS — I2510 Atherosclerotic heart disease of native coronary artery without angina pectoris: Secondary | ICD-10-CM | POA: Diagnosis not present

## 2018-11-22 DIAGNOSIS — Z95820 Peripheral vascular angioplasty status with implants and grafts: Secondary | ICD-10-CM | POA: Diagnosis not present

## 2018-11-22 DIAGNOSIS — Z7982 Long term (current) use of aspirin: Secondary | ICD-10-CM | POA: Insufficient documentation

## 2018-11-22 DIAGNOSIS — Z923 Personal history of irradiation: Secondary | ICD-10-CM | POA: Diagnosis not present

## 2018-11-22 DIAGNOSIS — I739 Peripheral vascular disease, unspecified: Secondary | ICD-10-CM | POA: Diagnosis present

## 2018-11-22 DIAGNOSIS — E7800 Pure hypercholesterolemia, unspecified: Secondary | ICD-10-CM | POA: Diagnosis not present

## 2018-11-22 DIAGNOSIS — I10 Essential (primary) hypertension: Secondary | ICD-10-CM | POA: Insufficient documentation

## 2018-11-22 DIAGNOSIS — Z952 Presence of prosthetic heart valve: Secondary | ICD-10-CM | POA: Insufficient documentation

## 2018-11-22 DIAGNOSIS — E1151 Type 2 diabetes mellitus with diabetic peripheral angiopathy without gangrene: Secondary | ICD-10-CM | POA: Insufficient documentation

## 2018-11-22 HISTORY — PX: LOWER EXTREMITY ANGIOGRAPHY: CATH118251

## 2018-11-22 HISTORY — PX: ABDOMINAL AORTOGRAM: CATH118222

## 2018-11-22 LAB — GLUCOSE, CAPILLARY: Glucose-Capillary: 79 mg/dL (ref 70–99)

## 2018-11-22 SURGERY — LOWER EXTREMITY ANGIOGRAPHY
Anesthesia: LOCAL

## 2018-11-22 MED ORDER — FENTANYL CITRATE (PF) 100 MCG/2ML IJ SOLN
INTRAMUSCULAR | Status: DC | PRN
  Administered 2018-11-22: 50 ug via INTRAVENOUS

## 2018-11-22 MED ORDER — DIPHENHYDRAMINE HCL 50 MG/ML IJ SOLN
INTRAMUSCULAR | Status: AC
  Filled 2018-11-22: qty 1

## 2018-11-22 MED ORDER — HEPARIN (PORCINE) IN NACL 1000-0.9 UT/500ML-% IV SOLN
INTRAVENOUS | Status: DC | PRN
  Administered 2018-11-22 (×2): 500 mL

## 2018-11-22 MED ORDER — SODIUM CHLORIDE 0.9% FLUSH: 3.0000 mL | Freq: Two times a day (BID) | INTRAVENOUS | Status: DC

## 2018-11-22 MED ORDER — SODIUM CHLORIDE 0.9 % IV SOLN
INTRAVENOUS | Status: DC
  Administered 2018-11-22: 06:00:00 via INTRAVENOUS

## 2018-11-22 MED ORDER — MIDAZOLAM HCL 2 MG/2ML IJ SOLN
INTRAMUSCULAR | Status: DC | PRN
  Administered 2018-11-22: 1 mg via INTRAVENOUS

## 2018-11-22 MED ORDER — FAMOTIDINE IN NACL 20-0.9 MG/50ML-% IV SOLN
INTRAVENOUS | Status: AC
  Filled 2018-11-22: qty 50

## 2018-11-22 MED ORDER — SODIUM CHLORIDE 0.9 % IV SOLN: INTRAVENOUS | Status: DC

## 2018-11-22 MED ORDER — FENTANYL CITRATE (PF) 100 MCG/2ML IJ SOLN
INTRAMUSCULAR | Status: AC
  Filled 2018-11-22: qty 2

## 2018-11-22 MED ORDER — SODIUM CHLORIDE 0.9 % IV BOLUS: 500.0000 mL | Freq: Once | INTRAVENOUS | Status: DC

## 2018-11-22 MED ORDER — SODIUM CHLORIDE 0.9 % IV SOLN: 250.0000 mL | INTRAVENOUS | Status: DC | PRN

## 2018-11-22 MED ORDER — FAMOTIDINE IN NACL 20-0.9 MG/50ML-% IV SOLN
20.0000 mg | Freq: Once | INTRAVENOUS | Status: DC
  Administered 2018-11-22: 20 mg via INTRAVENOUS

## 2018-11-22 MED ORDER — SODIUM CHLORIDE 0.9% FLUSH: 3.0000 mL | INTRAVENOUS | Status: DC | PRN

## 2018-11-22 MED ORDER — NITROGLYCERIN 1 MG/10 ML FOR IR/CATH LAB
INTRA_ARTERIAL | Status: AC
  Filled 2018-11-22: qty 10

## 2018-11-22 MED ORDER — MIDAZOLAM HCL 2 MG/2ML IJ SOLN
INTRAMUSCULAR | Status: AC
  Filled 2018-11-22: qty 2

## 2018-11-22 MED ORDER — ONDANSETRON HCL 4 MG/2ML IJ SOLN: 4.0000 mg | Freq: Four times a day (QID) | INTRAMUSCULAR | Status: DC | PRN

## 2018-11-22 MED ORDER — LIDOCAINE HCL (PF) 1 % IJ SOLN
INTRAMUSCULAR | Status: AC
  Filled 2018-11-22: qty 30

## 2018-11-22 MED ORDER — LIDOCAINE HCL (PF) 1 % IJ SOLN
INTRAMUSCULAR | Status: DC | PRN
  Administered 2018-11-22: 15 mL

## 2018-11-22 MED ORDER — ACETAMINOPHEN 325 MG PO TABS: 650.0000 mg | ORAL_TABLET | ORAL | Status: DC | PRN

## 2018-11-22 MED ORDER — HYDRALAZINE HCL 20 MG/ML IJ SOLN
INTRAMUSCULAR | Status: AC
  Filled 2018-11-22: qty 1

## 2018-11-22 MED ORDER — HEPARIN (PORCINE) IN NACL 1000-0.9 UT/500ML-% IV SOLN
INTRAVENOUS | Status: AC
  Filled 2018-11-22: qty 1000

## 2018-11-22 MED ORDER — HYDRALAZINE HCL 20 MG/ML IJ SOLN: 5.0000 mg | INTRAMUSCULAR | Status: DC | PRN

## 2018-11-22 MED ORDER — METHYLPREDNISOLONE SODIUM SUCC 125 MG IJ SOLR
INTRAMUSCULAR | Status: AC
  Filled 2018-11-22: qty 2

## 2018-11-22 MED ORDER — NITROGLYCERIN 1 MG/10 ML FOR IR/CATH LAB
INTRA_ARTERIAL | Status: DC | PRN
  Administered 2018-11-22: 800 mL via INTRA_ARTERIAL

## 2018-11-22 MED ORDER — IODIXANOL 320 MG/ML IV SOLN
INTRAVENOUS | Status: DC | PRN
  Administered 2018-11-22: 50 mL via INTRAVENOUS

## 2018-11-22 MED ORDER — HYDRALAZINE HCL 20 MG/ML IJ SOLN
INTRAMUSCULAR | Status: DC | PRN
  Administered 2018-11-22: 10 mg via INTRAVENOUS

## 2018-11-22 MED ORDER — METHYLPREDNISOLONE SODIUM SUCC 125 MG IJ SOLR
125.0000 mg | Freq: Once | INTRAMUSCULAR | Status: AC
  Administered 2018-11-22: 125 mg via INTRAVENOUS

## 2018-11-22 MED ORDER — DIPHENHYDRAMINE HCL 50 MG/ML IJ SOLN
25.0000 mg | Freq: Once | INTRAMUSCULAR | Status: AC
  Administered 2018-11-22: 25 mg via INTRAVENOUS

## 2018-11-22 MED ORDER — LABETALOL HCL 5 MG/ML IV SOLN: 10.0000 mg | INTRAVENOUS | Status: DC | PRN

## 2018-11-22 SURGICAL SUPPLY — 11 items
CATH OMNI FLUSH 5F 65CM (CATHETERS) ×1 IMPLANT
CATH STRAIGHT 5FR 65CM (CATHETERS) ×1 IMPLANT
GUIDEWIRE ANGLED .035X150CM (WIRE) ×1 IMPLANT
KIT MICROPUNCTURE NIT STIFF (SHEATH) ×1 IMPLANT
KIT PV (KITS) ×3 IMPLANT
SHEATH PINNACLE 5F 10CM (SHEATH) ×1 IMPLANT
SHEATH PROBE COVER 6X72 (BAG) ×1 IMPLANT
SYR MEDRAD MARK 7 150ML (SYRINGE) ×3 IMPLANT
TRANSDUCER W/STOPCOCK (MISCELLANEOUS) ×3 IMPLANT
TRAY PV CATH (CUSTOM PROCEDURE TRAY) ×3 IMPLANT
WIRE HITORQ VERSACORE ST 145CM (WIRE) ×1 IMPLANT

## 2018-11-22 NOTE — Interval H&P Note (Signed)
History and Physical Interval Note:  11/22/2018 7:43 AM  Mallory Thomas  has presented today for surgery, with the diagnosis of Claudication  The various methods of treatment have been discussed with the patient and family. After consideration of risks, benefits and other options for treatment, the patient has consented to  Procedure(s): LOWER EXTREMITY ANGIOGRAPHY (Bilateral) as a surgical intervention .  The patient's history has been reviewed, patient examined, no change in status, stable for surgery.  I have reviewed the patient's chart and labs.  Questions were answered to the patient's satisfaction.     [place]

## 2018-11-22 NOTE — Discharge Instructions (Signed)
Femoral Site Care °This sheet gives you information about how to care for yourself after your procedure. Your health care provider may also give you more specific instructions. If you have problems or questions, contact your health care provider. °What can I expect after the procedure? °After the procedure, it is common to have: °· Bruising that usually fades within 1-2 weeks. °· Tenderness at the site. °Follow these instructions at home: °Wound care °· Follow instructions from your health care provider about how to take care of your insertion site. Make sure you: °? Wash your hands with soap and water before you change your bandage (dressing). If soap and water are not available, use hand sanitizer. °? Change your dressing as told by your health care provider. °? Leave stitches (sutures), skin glue, or adhesive strips in place. These skin closures may need to stay in place for 2 weeks or longer. If adhesive strip edges start to loosen and curl up, you may trim the loose edges. Do not remove adhesive strips completely unless your health care provider tells you to do that. °· Do not take baths, swim, or use a hot tub until your health care provider approves. °· You may shower 24-48 hours after the procedure or as told by your health care provider. °? Gently wash the site with plain soap and water. °? Pat the area dry with a clean towel. °? Do not rub the site. This may cause bleeding. °· Do not apply powder or lotion to the site. Keep the site clean and dry. °· Check your femoral site every day for signs of infection. Check for: °? Redness, swelling, or pain. °? Fluid or blood. °? Warmth. °? Pus or a bad smell. °Activity °· For the first 2-3 days after your procedure, or as long as directed: °? Avoid climbing stairs as much as possible. °? Do not squat. °· Do not lift anything that is heavier than 10 lb (4.5 kg), or the limit that you are told, until your health care provider says that it is safe. °· Rest as  directed. °? Avoid sitting for a long time without moving. Get up to take short walks every 1-2 hours. °· Do not drive for 24 hours if you were given a medicine to help you relax (sedative). °General instructions °· Take over-the-counter and prescription medicines only as told by your health care provider. °· Keep all follow-up visits as told by your health care provider. This is important. °Contact a health care provider if you have: °· A fever or chills. °· You have redness, swelling, or pain around your insertion site. °Get help right away if: °· The catheter insertion area swells very fast. °· You pass out. °· You suddenly start to sweat or your skin gets clammy. °· The catheter insertion area is bleeding, and the bleeding does not stop when you hold steady pressure on the area. °· The area near or just beyond the catheter insertion site becomes pale, cool, tingly, or numb. °These symptoms may represent a serious problem that is an emergency. Do not wait to see if the symptoms will go away. Get medical help right away. Call your local emergency services (911 in the [place] Do not drive yourself to the hospital. °Summary °· After the procedure, it is common to have bruising that usually fades within 1-2 weeks. °· Check your femoral site every day for signs of infection. °· Do not lift anything that is heavier than 10 lb (4.5 kg), or the   limit that you are told, until your health care provider says that it is safe. °This information is not intended to replace advice given to you by your health care provider. Make sure you discuss any questions you have with your health care provider. °Document Released: 07/06/2014 Document Revised: 11/15/2017 Document Reviewed: 11/15/2017 °Elsevier Interactive Patient Education © 2019 [employer] ° °Moderate Conscious Sedation, Adult, Care After °These instructions provide you with information about caring for yourself after your procedure. Your health care provider may also give  you more specific instructions. Your treatment has been planned according to current medical practices, but problems sometimes occur. Call your health care provider if you have any problems or questions after your procedure. °What can I expect after the procedure? °After your procedure, it is common: °· To feel sleepy for several hours. °· To feel clumsy and have poor balance for several hours. °· To have poor judgment for several hours. °· To vomit if you eat too soon. °Follow these instructions at home: °For at least 24 hours after the procedure: ° °· Do not: °? Participate in activities where you could fall or become injured. °? Drive. °? Use heavy machinery. °? Drink alcohol. °? Take sleeping pills or medicines that cause drowsiness. °? Make important decisions or sign legal documents. °? Take care of children on your own. °· Rest. °Eating and drinking °· Follow the diet recommended by your health care provider. °· If you vomit: °? Drink water, juice, or soup when you can drink without vomiting. °? Make sure you have little or no nausea before eating solid foods. °General instructions °· Have a responsible adult stay with you until you are awake and alert. °· Take over-the-counter and prescription medicines only as told by your health care provider. °· If you smoke, do not smoke without supervision. °· Keep all follow-up visits as told by your health care provider. This is important. °Contact a health care provider if: °· You keep feeling nauseous or you keep vomiting. °· You feel light-headed. °· You develop a rash. °· You have a fever. °Get help right away if: °· You have trouble breathing. °This information is not intended to replace advice given to you by your health care provider. Make sure you discuss any questions you have with your health care provider. °Document Released: 08/23/2013 Document Revised: 04/06/2016 Document Reviewed: 02/22/2016 °Elsevier Interactive Patient Education © 2019 [employer] ° °

## 2018-11-24 DIAGNOSIS — E7800 Pure hypercholesterolemia, unspecified: Secondary | ICD-10-CM | POA: Diagnosis not present

## 2018-11-24 DIAGNOSIS — E119 Type 2 diabetes mellitus without complications: Secondary | ICD-10-CM | POA: Diagnosis not present

## 2018-11-24 DIAGNOSIS — I10 Essential (primary) hypertension: Secondary | ICD-10-CM | POA: Diagnosis not present

## 2018-11-24 DIAGNOSIS — I25118 Atherosclerotic heart disease of native coronary artery with other forms of angina pectoris: Secondary | ICD-10-CM | POA: Diagnosis not present

## 2018-12-02 DIAGNOSIS — I25118 Atherosclerotic heart disease of native coronary artery with other forms of angina pectoris: Secondary | ICD-10-CM | POA: Diagnosis not present

## 2018-12-02 DIAGNOSIS — Z952 Presence of prosthetic heart valve: Secondary | ICD-10-CM | POA: Diagnosis not present

## 2018-12-02 DIAGNOSIS — Z298 Encounter for other specified prophylactic measures: Secondary | ICD-10-CM | POA: Diagnosis not present

## 2018-12-02 DIAGNOSIS — I739 Peripheral vascular disease, unspecified: Secondary | ICD-10-CM | POA: Diagnosis not present

## 2018-12-28 ENCOUNTER — Other Ambulatory Visit: Payer: Self-pay | Admitting: Cardiovascular Disease

## 2019-01-27 ENCOUNTER — Encounter: Payer: Self-pay | Admitting: Physician Assistant

## 2019-02-08 ENCOUNTER — Ambulatory Visit (HOSPITAL_COMMUNITY): Payer: Medicare Other

## 2019-02-08 ENCOUNTER — Telehealth (INDEPENDENT_AMBULATORY_CARE_PROVIDER_SITE_OTHER): Payer: Medicare Other | Admitting: Physician Assistant

## 2019-02-08 ENCOUNTER — Other Ambulatory Visit: Payer: Self-pay

## 2019-02-08 DIAGNOSIS — Z952 Presence of prosthetic heart valve: Secondary | ICD-10-CM

## 2019-02-08 NOTE — Patient Instructions (Signed)
Hello Mallory Thomas,   It was so nice to talk to you on the phone today. I am so glad you are doing so well. I just wanted to send you a recap of our discussion.   Please continue taking antibiotics prior to any dental work including cleanings. You can discontinue plavix now. Please continue on aspirin 81 mg indefinitely.   Your 1 year echo has been rescheduled.   Please continue to follow with Dr. Einar Gip as regularly scheduled  All appointment details are attached to this letter in your "after visit summary."  Please call us with any questions or concerns you may have and please stay safe during these uncertain times.  [place]

## 2019-02-08 NOTE — Progress Notes (Addendum)
[facility]   MULTIDISCIPLINARY HEART VALVE TEAM   Evaluation Performed:  Follow-up visit  This visit type was conducted due to national recommendations for restrictions regarding the COVID-19 Pandemic (e.g. social distancing).  This format is felt to be most appropriate for this patient at this time.  All issues noted in this document were discussed and addressed.  No physical exam was performed (except for noted visual exam findings with Telehealth visits).  The patient has consented to conduct a Telehealth visit and understands insurance will be billed.   Date:  02/09/2019   ID:  Mallory Thomas, DOB 1932/05/11, MRN [id number]  Patient Location:  [place] [address]   Provider location:   [place], [place] [address]  PCP:  Jani Gravel, MD  Cardiologist:  Dr. Einar Gip / Dr. Burt Knack & Dr. Roxy Manns (TAVR)   Chief Complaint:  1 year s/p TAVR  History of Present Illness:    Mallory Thomas is a 85 y.o. female with a history of breast cancer s/p XRT/lumpectomy, DMT2, HLD, HTN, PAD s/p R iliac stenting, CAD and severe AS s/p TAVR (02/15/18) who presents via audio conferencing for a telehealth visit today.    The patient does not have symptoms concerning for COVID-19 infection (fever, chills, cough, or new SHORTNESS OF BREATH).   She underwent TAVR with a 23 mm Sapien 3 transcatheter heart valve 02/15/2018.  The patient's procedure was uncomplicated and she was discharged home after 48 hours. 1 month post op echo showed normally functioning TAVR valve with a mean gradient of 15 mm Hg and no PVL.   She has done quite well since surgery. Underwent LE angiography due to LE pain in January. This was negative for significant PAD explaining her sx and she was referred to ortho. No CP or SOB. No LE edema, orthopnea or PND. No dizziness or syncope. No blood in stool or urine. No palpitations. She has been making masks for hospitals to keep busy during this covid 19  pandemic.    Prior CV studies:   The following studies were reviewed today:  TAVR OPERATIVE NOTE   Date of Procedure:                02/15/2018  Preoperative Diagnosis:      Severe Aortic Stenosis   Postoperative Diagnosis:    Same   Procedure:        Transcatheter Aortic Valve Replacement - Percutaneous Transfemoral Approach             Edwards Sapien 3 THV (size 23 mm, model # 9600TFX, serial # [id number])              Co-Surgeons:                        Valentina Gu. Roxy Manns, MD and Sherren Mocha, MD  Anesthesiologist:                  Dr Roanna Banning  Echocardiographer:              Dr Aundra Dubin  Pre-operative Echo Findings: ? Severe aortic stenosis ? normal left ventricular systolic function  Post-operative Echo Findings: ? no paravalvular leak ? normal left ventricular systolic function  ____________  Echo 03/31/2018 Study Conclusions - Left ventricle: The cavity size was normal. Wall thickness was   increased in a pattern of moderate LVH. Systolic function was   vigorous. The estimated ejection fraction was in  the range of 65%   to 70%. Wall motion was normal; there were no regional wall   motion abnormalities. Features are consistent with a pseudonormal   left ventricular filling pattern, with concomitant abnormal   relaxation and increased filling pressure (grade 2 diastolic   dysfunction). - Aortic valve: A bioprosthesis was present. Mean grad 15 mm Hg. No PVL - Mitral valve: There was mild regurgitation.   Past Medical History:  Diagnosis Date  . Aortic stenosis   . Breast cancer ([place]) 2004   right  . Diabetes mellitus   . Heart murmur   . Hyperlipidemia   . Hypertension   . S/P TAVR (transcatheter aortic valve replacement) 02/15/2018   23 mm Edwards Sapien 3 transcatheter heart valve placed via percutaneous right transfemoral approach    Past Surgical History:  Procedure Laterality Date  . ABDOMINAL AORTOGRAM N/A 11/22/2018   Procedure: ABDOMINAL  AORTOGRAM;  Surgeon: Adrian Prows, MD;  Location: [facility];  Service: Cardiovascular;  Laterality: N/A;  . BILATERAL KNEE ARTHROSCOPY    . BREAST LUMPECTOMY Right 2004   radiation   . CARDIAC CATHETERIZATION     12/24/17  . INTRAVASCULAR ULTRASOUND/IVUS N/A 12/24/2017   Procedure: Intravascular Ultrasound/IVUS;  Surgeon: Adrian Prows, MD;  Location: [facility];  Service: Cardiovascular;  Laterality: N/A;  . LOWER EXTREMITY ANGIOGRAPHY Bilateral 11/22/2018   Procedure: LOWER EXTREMITY ANGIOGRAPHY;  Surgeon: Adrian Prows, MD;  Location: [facility];  Service: Cardiovascular;  Laterality: Bilateral;  . lymph node removal right arm    . PERIPHERAL VASCULAR CATHETERIZATION N/A 11/10/2016   Procedure: Lower Extremity Angiography;  Surgeon: Adrian Prows, MD;  Location: [facility];  Service: Cardiovascular;  Laterality: N/A;  . PERIPHERAL VASCULAR CATHETERIZATION Right 11/10/2016   Procedure: Peripheral Vascular Intervention;  Surgeon: Adrian Prows, MD;  Location: [facility];  Service: Cardiovascular;  Laterality: Right;  Rt Com Iliac  . TEE WITHOUT CARDIOVERSION N/A 02/15/2018   Procedure: TRANSESOPHAGEAL ECHOCARDIOGRAM (TEE);  Surgeon: Sherren Mocha, MD;  Location: [facility];  Service: Open Heart Surgery;  Laterality: N/A;  . TONSILLECTOMY    . TRANSCATHETER AORTIC VALVE REPLACEMENT, TRANSFEMORAL N/A 02/15/2018   Procedure: TRANSCATHETER AORTIC VALVE REPLACEMENT, TRANSFEMORAL;  Surgeon: Sherren Mocha, MD;  Location: [facility];  Service: Open Heart Surgery;  Laterality: N/A;     No outpatient medications have been marked as taking for the 02/08/19 encounter (Telemedicine) with Eileen Stanford, PA-C.     Allergies:   Fish allergy; Iodine; Shellfish allergy; Tape; and Zetia [ezetimibe]   Social History   Tobacco Use  . Smoking status: Former Smoker    Last attempt to quit: 06/04/1968    Years since quitting: 50.7  . Smokeless tobacco: Never Used  Substance Use Topics  .  Alcohol use: Never    Frequency: Never    Comment: occasional  . Drug use: No     Family Hx: The patient's family history is negative for Breast cancer.  ROS:   Please see the history of present illness.    All other systems reviewed and are negative.   Labs/Other Tests and Data Reviewed:    Recent Labs: 02/11/2018: B Natriuretic Peptide 92.3 02/16/2018: Magnesium 1.6 06/30/2018: ALT 6; BUN 30; Creatinine, Ser 1.08; Hemoglobin 11.3; Platelets 128; Potassium 5.9; Sodium 142   Recent Lipid Panel Lab Results  Component Value Date/Time   CHOL 238 (H) 05/14/2009 09:15 AM   TRIG 63 05/14/2009 09:15 AM   HDL 73  05/14/2009 09:15 AM   CHOLHDL 3.3 05/14/2009 09:15 AM   LDLCALC 152 (H) 05/14/2009 09:15 AM    Wt Readings from Last 3 Encounters:  11/22/18 185 lb (83.9 kg)  07/07/18 188 lb 1.6 oz (85.3 kg)  03/31/18 184 lb 12.8 oz (83.8 kg)     Exam:    Not completed as visit conducted over the phone  ASSESSMENT & PLAN:    Severe AS s/p TAVR: echo today pushed out given covid 19 pandemic. She has NYHA class I symptoms and stays very active. SBE prophylaxis discussed; she has amoxicillin. She remains on aspirin and plavix. Plavix can be discontinued at this time. [facility] completed over the phone   COVID-19 Education: The signs and symptoms of COVID-19 were discussed with the patient and how to seek care for testing (follow up with PCP or arrange E-visit).  The importance of social distancing was discussed today.  Patient Risk:   After full review of this patients clinical status, I feel that they are at least moderate risk at this time.  Time:   Today, I have spent 13 minutes with the patient with telehealth technology discussing her recovery from her TAVR and instructions for the future.     Medication Adjustments/Labs and Tests Ordered: Current medicines are reviewed at length with the patient today.  Concerns regarding medicines are outlined above.  Tests Ordered: No orders of  the defined types were placed in this encounter.  Medication Changes: Stop plavix.   Disposition:  Continue follow up with Dr. Einar Gip and regularly scheduled.   Signed, Angelena Form, PA-C  02/09/2019 10:33 AM    [facility] [place], [place], [place]  [address] Phone: [phone]; Fax: [phone]

## 2019-02-17 ENCOUNTER — Other Ambulatory Visit: Payer: Self-pay

## 2019-03-21 ENCOUNTER — Other Ambulatory Visit: Payer: Self-pay

## 2019-03-21 NOTE — Patient Outreach (Signed)
[facility]) Care Management  03/21/2019  Mallory Thomas [birth year] [id number]   Medication Adherence call to Mallory Thomas spoke with patient she is due on Atorvastatin she explain she is taking 1 tablet daily of Atorvastatin 10 mg patient was taking Atorvastatin 80 mg patient has medication for about one more week and has already order thru [facility] patient has a pill box and fills every week. Mallory Thomas is showing past due under [facility].   [facility] Direct Dial [phone]  Fax [phone] [email or web address]

## 2019-03-27 ENCOUNTER — Telehealth (HOSPITAL_COMMUNITY): Payer: Self-pay

## 2019-03-27 NOTE — Telephone Encounter (Signed)
Attempted call for COVID prescreening. Voice mailbox full and cannot leave message.

## 2019-03-28 ENCOUNTER — Telehealth (HOSPITAL_COMMUNITY): Payer: Self-pay | Admitting: *Deleted

## 2019-03-28 NOTE — Telephone Encounter (Signed)

## 2019-03-29 ENCOUNTER — Other Ambulatory Visit: Payer: Self-pay

## 2019-03-29 ENCOUNTER — Ambulatory Visit (HOSPITAL_COMMUNITY): Payer: Medicare Other | Attending: Cardiovascular Disease

## 2019-03-29 DIAGNOSIS — Z952 Presence of prosthetic heart valve: Secondary | ICD-10-CM

## 2019-05-01 DIAGNOSIS — I10 Essential (primary) hypertension: Secondary | ICD-10-CM | POA: Diagnosis not present

## 2019-05-01 DIAGNOSIS — E7800 Pure hypercholesterolemia, unspecified: Secondary | ICD-10-CM | POA: Diagnosis not present

## 2019-05-01 DIAGNOSIS — E119 Type 2 diabetes mellitus without complications: Secondary | ICD-10-CM | POA: Diagnosis not present

## 2019-05-07 ENCOUNTER — Other Ambulatory Visit: Payer: Self-pay | Admitting: Cardiovascular Disease

## 2019-05-08 ENCOUNTER — Other Ambulatory Visit: Payer: Self-pay | Admitting: Cardiovascular Disease

## 2019-05-10 DIAGNOSIS — E7800 Pure hypercholesterolemia, unspecified: Secondary | ICD-10-CM | POA: Diagnosis not present

## 2019-05-10 DIAGNOSIS — E875 Hyperkalemia: Secondary | ICD-10-CM | POA: Diagnosis not present

## 2019-05-10 DIAGNOSIS — I10 Essential (primary) hypertension: Secondary | ICD-10-CM | POA: Diagnosis not present

## 2019-05-10 DIAGNOSIS — E1165 Type 2 diabetes mellitus with hyperglycemia: Secondary | ICD-10-CM | POA: Diagnosis not present

## 2019-05-15 ENCOUNTER — Encounter: Payer: Self-pay | Admitting: Thoracic Surgery (Cardiothoracic Vascular Surgery)

## 2019-05-17 DIAGNOSIS — R7989 Other specified abnormal findings of blood chemistry: Secondary | ICD-10-CM | POA: Diagnosis not present

## 2019-05-17 DIAGNOSIS — E119 Type 2 diabetes mellitus without complications: Secondary | ICD-10-CM | POA: Diagnosis not present

## 2019-05-17 DIAGNOSIS — I10 Essential (primary) hypertension: Secondary | ICD-10-CM | POA: Diagnosis not present

## 2019-05-17 DIAGNOSIS — E875 Hyperkalemia: Secondary | ICD-10-CM | POA: Diagnosis not present

## 2019-05-17 DIAGNOSIS — E7800 Pure hypercholesterolemia, unspecified: Secondary | ICD-10-CM | POA: Diagnosis not present

## 2019-05-23 ENCOUNTER — Other Ambulatory Visit: Payer: Self-pay | Admitting: Oncology

## 2019-05-23 DIAGNOSIS — Z1231 Encounter for screening mammogram for malignant neoplasm of breast: Secondary | ICD-10-CM

## 2019-05-25 DIAGNOSIS — E875 Hyperkalemia: Secondary | ICD-10-CM | POA: Diagnosis not present

## 2019-05-25 DIAGNOSIS — E1165 Type 2 diabetes mellitus with hyperglycemia: Secondary | ICD-10-CM | POA: Diagnosis not present

## 2019-05-25 DIAGNOSIS — E7800 Pure hypercholesterolemia, unspecified: Secondary | ICD-10-CM | POA: Diagnosis not present

## 2019-05-25 DIAGNOSIS — I10 Essential (primary) hypertension: Secondary | ICD-10-CM | POA: Diagnosis not present

## 2019-05-29 ENCOUNTER — Other Ambulatory Visit: Payer: Self-pay | Admitting: Cardiology

## 2019-05-29 DIAGNOSIS — E7800 Pure hypercholesterolemia, unspecified: Secondary | ICD-10-CM | POA: Diagnosis not present

## 2019-05-30 LAB — LIPID PANEL W/O CHOL/HDL RATIO
Cholesterol, Total: 273 mg/dL — ABNORMAL HIGH (ref 100–199)
HDL: 64 mg/dL (ref >39)
LDL Calculated: 190 mg/dL — ABNORMAL HIGH (ref 0–99)
Triglycerides: 93 mg/dL (ref 0–149)
VLDL Cholesterol Cal: 19 mg/dL (ref 5–40)

## 2019-05-31 ENCOUNTER — Encounter: Payer: Self-pay | Admitting: Cardiology

## 2019-06-02 ENCOUNTER — Ambulatory Visit: Payer: Medicare Other | Admitting: Cardiology

## 2019-06-02 ENCOUNTER — Encounter: Payer: Self-pay | Admitting: Cardiology

## 2019-06-02 ENCOUNTER — Other Ambulatory Visit: Payer: Self-pay

## 2019-06-02 VITALS — BP 115/65 | HR 107 | Ht 62.0 in | Wt 182.0 lb

## 2019-06-02 DIAGNOSIS — Z952 Presence of prosthetic heart valve: Secondary | ICD-10-CM

## 2019-06-02 DIAGNOSIS — E7800 Pure hypercholesterolemia, unspecified: Secondary | ICD-10-CM | POA: Diagnosis not present

## 2019-06-02 DIAGNOSIS — I10 Essential (primary) hypertension: Secondary | ICD-10-CM

## 2019-06-02 DIAGNOSIS — I739 Peripheral vascular disease, unspecified: Secondary | ICD-10-CM | POA: Diagnosis not present

## 2019-06-02 DIAGNOSIS — I25118 Atherosclerotic heart disease of native coronary artery with other forms of angina pectoris: Secondary | ICD-10-CM

## 2019-06-02 MED ORDER — REPATHA SURECLICK 140 MG/ML ~~LOC~~ SOAJ: 1.0000 "pen " | SUBCUTANEOUS | 3 refills | Status: DC

## 2019-06-02 NOTE — Progress Notes (Signed)
Primary Physician/Referring:  Mallory Gravel, MD  Patient ID: Mallory Thomas, female    DOB: [birth year], 85 y.o.   MRN: [id number]  Chief Complaint  Patient presents with  . Coronary Artery Disease  . Hypertension  . Follow-up   HPI:    HPI: Mallory Thomas  is a 85 y.o. female  with history of breast cancer status post radiation therapy he and lumpectomy on the right breast in 2000 for and hypertension, diabetes mellitus, hyperlipidemia, peripheral arterial disease with right iliac artery stenting in 2017, patent by angiography on 11/29/2018, coronary artery disease, asymptomatic by angiography on 12/24/2017, critical aortic stenosis underwent TAVR on 02/15/2018 and presents here for follow-up.  Patient had severe myalgias and once progression of PAD was excluded, high-dose atorvastatin was discontinued and she was placed on 10 mg of atorvastatin.  Due to uncontrolled hyperlipidemia, she was started on Praluent with significant improvement in lipids.  He now presents here for follow-up of 6 months.   She has discontinued taking Repatha in view of cost.  Otherwise remains asymptomatic and continues to exercise regularly.  Recently found to have elevated potassium, Benicar and Giardia is discontinued.  Past Medical History:  Diagnosis Date  . Aortic stenosis   . Breast cancer ([place]) 2004   right  . Diabetes mellitus   . Heart murmur   . Hyperlipidemia   . Hypertension   . S/P TAVR (transcatheter aortic valve replacement) 02/15/2018   23 mm Edwards Sapien 3 transcatheter heart valve placed via percutaneous right transfemoral approach    Past Surgical History:  Procedure Laterality Date  . ABDOMINAL AORTOGRAM N/A 11/22/2018   Procedure: ABDOMINAL AORTOGRAM;  Surgeon: Adrian Prows, MD;  Location: [facility];  Service: Cardiovascular;  Laterality: N/A;  . BILATERAL KNEE ARTHROSCOPY    . BREAST LUMPECTOMY Right 2004   radiation   . CARDIAC CATHETERIZATION     12/24/17  .  INTRAVASCULAR ULTRASOUND/IVUS N/A 12/24/2017   Procedure: Intravascular Ultrasound/IVUS;  Surgeon: Adrian Prows, MD;  Location: [facility];  Service: Cardiovascular;  Laterality: N/A;  . LOWER EXTREMITY ANGIOGRAPHY Bilateral 11/22/2018   Procedure: LOWER EXTREMITY ANGIOGRAPHY;  Surgeon: Adrian Prows, MD;  Location: [facility];  Service: Cardiovascular;  Laterality: Bilateral;  . lymph node removal right arm    . PERIPHERAL VASCULAR CATHETERIZATION N/A 11/10/2016   Procedure: Lower Extremity Angiography;  Surgeon: Adrian Prows, MD;  Location: [facility];  Service: Cardiovascular;  Laterality: N/A;  . PERIPHERAL VASCULAR CATHETERIZATION Right 11/10/2016   Procedure: Peripheral Vascular Intervention;  Surgeon: Adrian Prows, MD;  Location: [facility];  Service: Cardiovascular;  Laterality: Right;  Rt Com Iliac  . TEE WITHOUT CARDIOVERSION N/A 02/15/2018   Procedure: TRANSESOPHAGEAL ECHOCARDIOGRAM (TEE);  Surgeon: Sherren Mocha, MD;  Location: [facility];  Service: Open Heart Surgery;  Laterality: N/A;  . TONSILLECTOMY    . TRANSCATHETER AORTIC VALVE REPLACEMENT, TRANSFEMORAL N/A 02/15/2018   Procedure: TRANSCATHETER AORTIC VALVE REPLACEMENT, TRANSFEMORAL;  Surgeon: Sherren Mocha, MD;  Location: [facility];  Service: Open Heart Surgery;  Laterality: N/A;   Social History   Socioeconomic History  . Marital status: Widowed    Spouse name: Not on file  . Number of children: 3  . Years of education: Not on file  . Highest education level: Not on file  Occupational History  . Not on file  Social Needs  . Financial resource strain: Not on file  . Food insecurity  Worry: Not on file    Inability: Not on file  . Transportation needs    Medical: Not on file    Non-medical: Not on file  Tobacco Use  . Smoking status: Former Smoker    Years: 15.00    Types: Cigarettes    Quit date: 06/04/1968    Years since quitting: 51.0  . Smokeless tobacco: Never Used  Substance and Sexual  Activity  . Alcohol use: Yes    Frequency: Never    Comment: occasional  . Drug use: No  . Sexual activity: Not on file  Lifestyle  . Physical activity    Days per week: Not on file    Minutes per session: Not on file  . Stress: Not on file  Relationships  . Social Herbalist on phone: Not on file    Gets together: Not on file    Attends religious service: Not on file    Active member of club or organization: Not on file    Attends meetings of clubs or organizations: Not on file    Relationship status: Not on file  . Intimate partner violence    Fear of current or ex partner: Not on file    Emotionally abused: Not on file    Physically abused: Not on file    Forced sexual activity: Not on file  Other Topics Concern  . Not on file  Social History Narrative  . Not on file   ROS  Review of Systems  Constitution: Negative for chills, decreased appetite, malaise/fatigue and weight gain.  Cardiovascular: Positive for claudication. Negative for dyspnea on exertion, leg swelling and syncope.  Endocrine: Negative for cold intolerance.  Hematologic/Lymphatic: Does not bruise/bleed easily.  Musculoskeletal: Positive for joint pain and muscle cramps. Negative for joint swelling.  Gastrointestinal: Negative for abdominal pain, anorexia, change in bowel habit, hematochezia and melena.  Neurological: Negative for headaches and light-headedness.  Psychiatric/Behavioral: Negative for depression and substance abuse.  All other systems reviewed and are negative.  Objective  Blood pressure 115/65, pulse (!) 107, height '5\' 2"'$  (1.575 m), weight 182 lb (82.6 kg), SpO2 98 %. Body mass index is 33.29 kg/m.   Physical Exam  Constitutional: She appears well-developed and well-nourished. No distress.  HENT:  Head: Atraumatic.  Eyes: Conjunctivae are normal.  Neck: Neck supple. No JVD present. No thyromegaly present.  Cardiovascular: Normal rate, regular rhythm, S1 normal, S2 normal  and intact distal pulses. Exam reveals no gallop.  Murmur heard. Pulses:      Carotid pulses are on the right side with bruit and on the left side with bruit.      Radial pulses are 2+ on the right side and 2+ on the left side.       Femoral pulses are 1+ on the right side and 1+ on the left side.      Popliteal pulses are 1+ on the right side and 1+ on the left side.       Dorsalis pedis pulses are 0 on the right side and 0 on the left side.       Posterior tibial pulses are 0 on the right side and 0 on the left side.  No edema.  Pulmonary/Chest: Effort normal and breath sounds normal.  Abdominal: Soft. Bowel sounds are normal.  Musculoskeletal: Normal range of motion.        General: No edema.  Neurological: She is alert.  Skin: Skin is warm and dry.  Psychiatric: She has a normal mood and affect.   Radiology: No results found.  Laboratory examination:   11/15/2018: RBC 3.61, normal H&H, platelets minimally decreased at 147, CBC otherwise normal. Glucose 112, creatinine 1.13, EGFR 45/52, potassium 5.3, CMP otherwise normal. TSH normal at 3.8.   Cholesterol 176, triglycerides 74, HDL 76, LDL 85. Hemoglobin A1c 6.2% on Praluent 150 mg.   CMP Latest Ref Rng & Units 06/30/2018 03/10/2018 03/03/2018  Glucose 70 - 99 mg/dL 87 118(H) 93  BUN 8 - 23 mg/dL 30(H) 24 23  Creatinine 0.44 - 1.00 mg/dL 1.08(H) 1.00 1.05(H)  Sodium 135 - 145 mmol/L 142 141 141  Potassium 3.5 - 5.1 mmol/L 5.9(H) 5.1 5.6(H)  Chloride 98 - 111 mmol/L 111 106 106  CO2 22 - 32 mmol/L '23 20 22  '$ Calcium 8.9 - 10.3 mg/dL 8.8(L) 9.0 8.9  Total Protein 6.5 - 8.1 g/dL 6.9 - -  Total Bilirubin 0.3 - 1.2 mg/dL 0.4 - -  Alkaline Phos 38 - 126 U/L 45 - -  AST 15 - 41 U/L 15 - -  ALT 0 - 44 U/L 6 - -   CBC Latest Ref Rng & Units 06/30/2018 03/03/2018 02/16/2018  WBC 3.9 - 10.3 K/uL 3.8(L) 5.5 5.5  Hemoglobin 11.6 - 15.9 g/dL 11.3(L) 11.3 9.4(L)  Hematocrit 34.8 - 46.6 % 34.9 33.6(L) 28.6(L)  Platelets 145 - 400 K/uL  128(L) 209 109(L)   Lipid Panel     Component Value Date/Time   CHOL 273 (H) 05/29/2019 0950   TRIG 93 05/29/2019 0950   HDL 64 05/29/2019 0950   CHOLHDL 3.3 05/14/2009 0915   VLDL 13 05/14/2009 0915   LDLCALC 190 (H) 05/29/2019 0950   HEMOGLOBIN A1C Lab Results  Component Value Date   HGBA1C 6.7 (H) 02/11/2018   MPG 145.59 02/11/2018  RRRFV TSH No results for input(s): TSH in the last 8760 hours. Medications   Current Outpatient Medications  Medication Instructions  . amLODipine (NORVASC) 5 mg, Oral, Daily  . aspirin 81 mg, Oral, Daily at bedtime  . atorvastatin (LIPITOR) 10 mg, Oral, Daily  . colestipol (COLESTID) 1 g, Oral, 3 times daily before meals  . Evolocumab (REPATHA SURECLICK) 132 MG/ML SOAJ 1 pen, Subcutaneous, Every 14 days  . hydrochlorothiazide (MICROZIDE) 12.5 mg, Oral, Daily  . linagliptin (TRADJENTA) 5 mg, Oral, Daily   Cardiac Studies:   Coronary angiogram 12/24/2017:  Severe diffuse coronary calcification. RCA ostial 70-80% and mid 80% by IVUS. Mild disease Cx & LAD. EKG 10/05/2018: Normal sinus rhythm at rate of 67 bpm, normal axis. No evidence of ischemia, normal EKG.  TAVR 02/15/2018: Edwards Sapien 3 THV (size 23 mm, model # 9600TFX, serial # [id number])  Echocardiogram 03/31/2018:  Normal LV systolic function, moderate LVH, LVEF 65-70%. Grade 2 diastolic dysfunction. Bioprosthetic aortic valve was present at mild mitral regurgitation.  Abdominal aortic duplex 10/27/2018: Normal abdominal aorta. No evidence of aneurysm. Peak systolic velocities in the mid and distal aorta are moderately increased to 245.72 cm/s. suggests > 50% stenosis. Peak systolic velocities in the right internal iliac artery are severely increased to 303.74 cm/s. Peak systolic velocities in the left internal iliac artery are moderately increased to 259.37 cm/s. suggestive of >50% stenosis. Compared to 06/16/2017, previously normal velocity. Consider further work up if clinically  indicated  Peripheral arteriogram 11/29/2018: Distal aorta diffuse 30% stenosis. Right renal superior pole 95% stenosis. Right CIA 80% to 0% with 10x30 mm self expanding Absolute Pro placed on 11/10/2018 widely patent. Left  mild disease.   Assessment     ICD-10-CM   1. Coronary artery disease involving native coronary artery with other form of angina pectoris, unspecified whether native or transplanted heart ([place])  I25.118 EKG 12-Lead  2. S/P TAVR (transcatheter aortic valve replacement)  Z95.2    TAVR 02/15/2018: Edwards Sapien 3 THV (size 23 mm)  3. Hypercholesteremia  E78.00 Evolocumab (REPATHA SURECLICK) 721 MG/ML SOAJ    Lipid Panel With LDL/HDL Ratio  4. PAD (peripheral artery disease) (HCC)  I73.9   5. Essential hypertension  C28 Basic metabolic panel    EKG 83/37/4451: Normal sinus rhythm/sinus tachycardia at rate of 102 bpm, normal axis, incomplete right bundle branch block, no evidence of ischemia, otherwise normal EKG.  Recommendations:   Patient presents here for follow-up and evaluation of peripheral artery disease, coronary artery disease and aortic valve replacement.  She is presently doing well and essentially asymptomatic but has discontinued taking Repatha for cost reasons.   I reviewed her labs again with the patient, I'm concerned about significant progression of vascular disease especially in view of her underlying risk factors including hypertension, hyperlipidemia and diabetes mellitus and age.  Samples were given, she is now willing to take some in spite of cost.  She was also found to be hyperkalemic and Giardia and send Benicar has been discontinued by her PCP.  I'll obtain a BMP along with lipid profile testing in 3 weeks and send Dr. Maudie Mercury a copy of the same for follow-up.  Otherwise from cardiac standpoint she remained stable, I'll see her back in 6 months. I suspect she'll have significant drop in her potassium levels especially hydrochlorothiazide was also added  for reducing the potassium levels along with discontinuing cardiac is and Benicar.  Suspect she will benefit from being back on Benicar.  Adrian Prows, MD, [facility] 06/02/2019, 2:14 [facility] [place] Pager: [phone] Office: [phone] If no answer Cell [phone]

## 2019-06-09 DIAGNOSIS — I10 Essential (primary) hypertension: Secondary | ICD-10-CM | POA: Diagnosis not present

## 2019-06-09 DIAGNOSIS — E119 Type 2 diabetes mellitus without complications: Secondary | ICD-10-CM | POA: Diagnosis not present

## 2019-06-09 DIAGNOSIS — E7800 Pure hypercholesterolemia, unspecified: Secondary | ICD-10-CM | POA: Diagnosis not present

## 2019-06-17 IMAGING — CT CT ANGIO CHEST
2 of 14 series · 11 of 37 positions shown · IV contrast (APPLIED)
Comparison: None.

CLINICAL DATA: 85-year-old female with history of severe aortic
stenosis. Preprocedural study prior to potential transcatheter
aortic valve replacement (TAVR) procedure.

EXAM:
CT ANGIOGRAPHY CHEST, ABDOMEN AND PELVIS
TECHNIQUE: Multidetector CT imaging through the chest, abdomen and pelvis was
performed using the standard protocol during bolus administration of
intravenous contrast. Multiplanar reconstructed images and MIPs were
obtained and reviewed to evaluate the vascular anatomy.
CONTRAST:  100 mL of Isovue 370.

[Series 7: 0-90% · axial · 0.38mm/px · z∈[-631,-449]mm · 8 of 3460 slices shown]
[im 217/3460  lung]
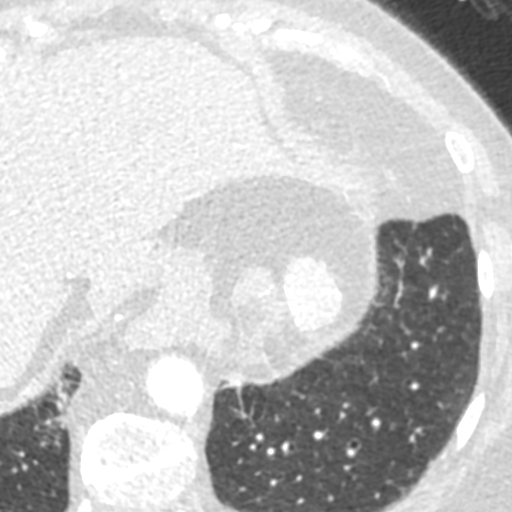
[im 649/3460  lung]
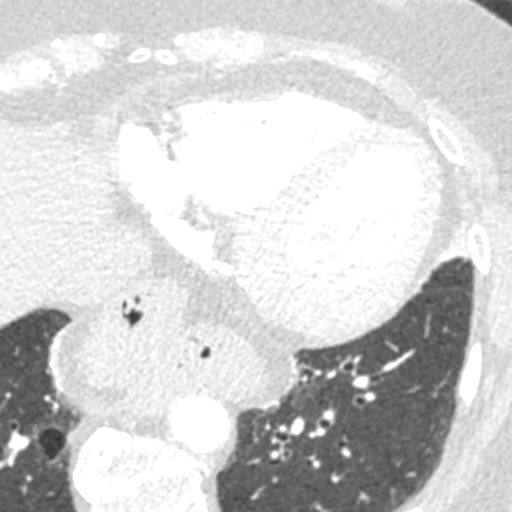
[im 1081/3460  lung]
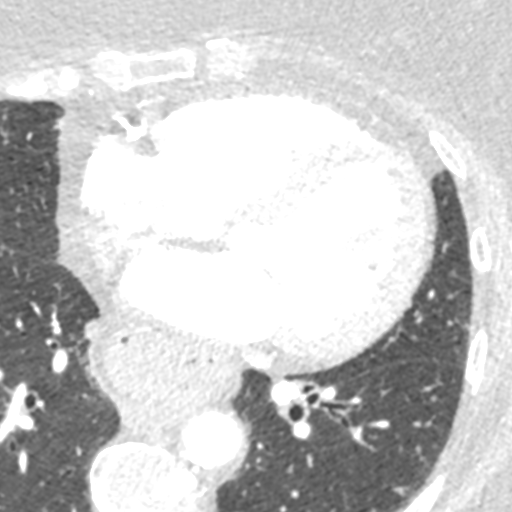
[im 1514/3460  lung]
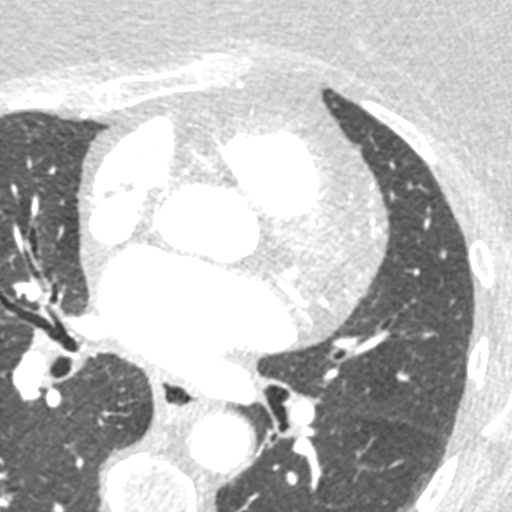
[im 1946/3460  lung]
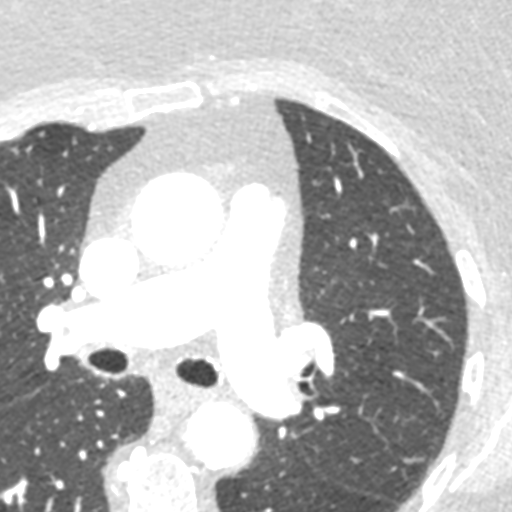
[im 2379/3460  lung]
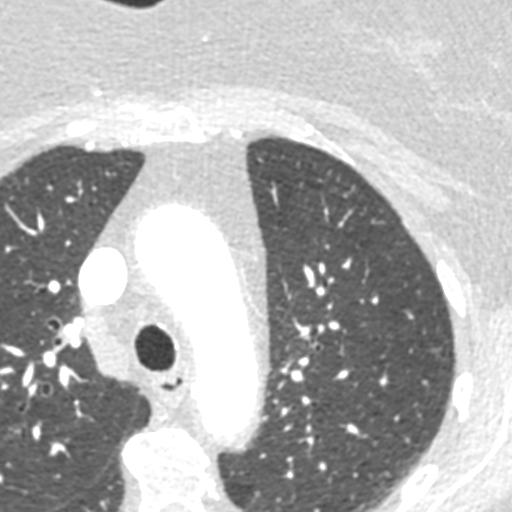
[im 2811/3460  lung]
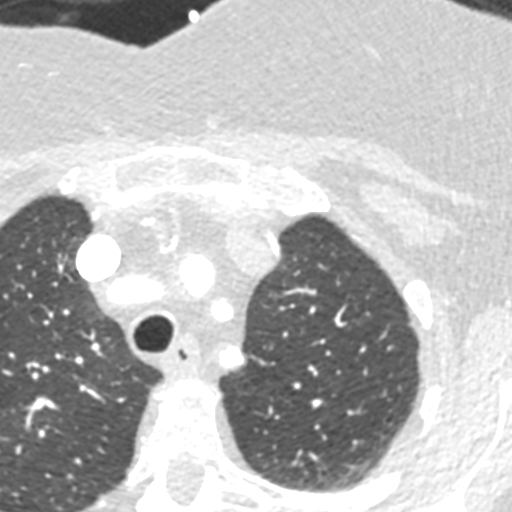
[im 3243/3460  lung]
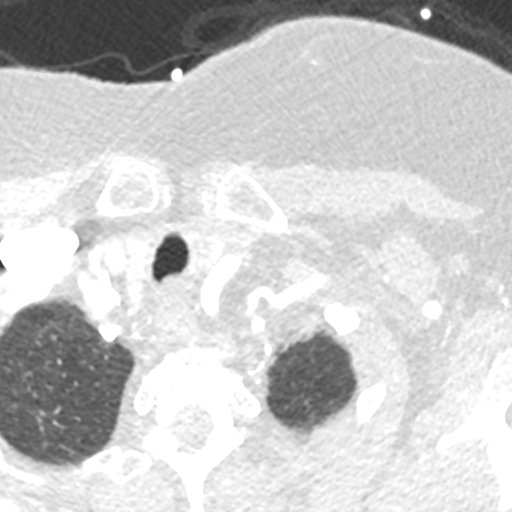

[Series 13: ax thins · axial · 0.59mm/px · z∈[-1022,-412]mm · 3 of 611 slices shown]
[im 1/611  lung]
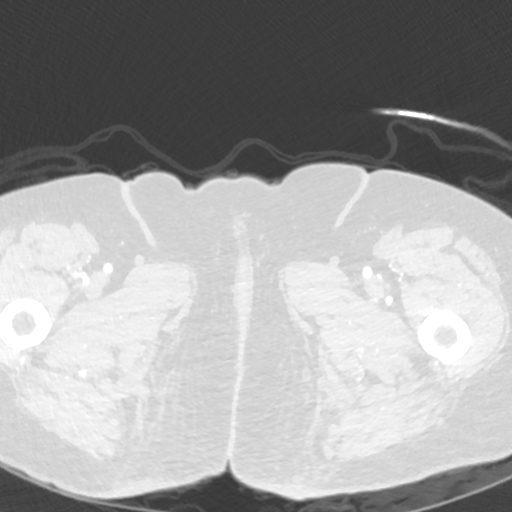
[im 306/611  mediastinal]
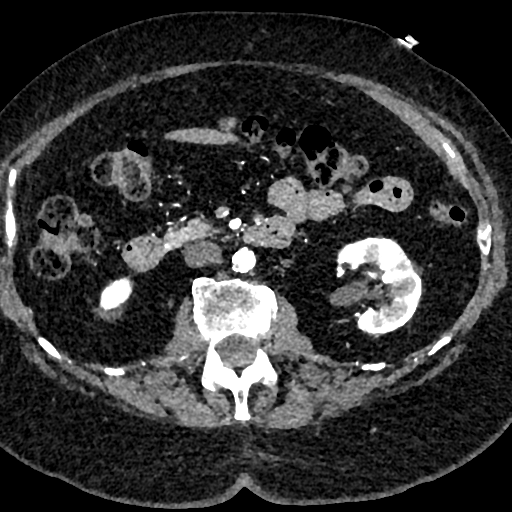
[im 611/611  lung]
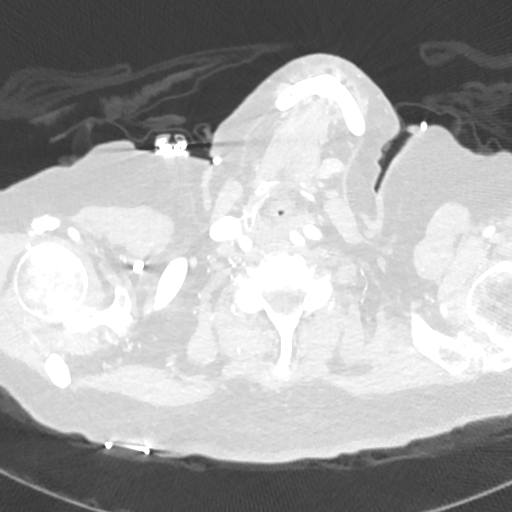

[11 of 37 positions shown; findings below may reference images not displayed]

FINDINGS: CTA CHEST FINDINGS

Cardiovascular: Heart size is mildly enlarged with left atrial
dilatation. There is no significant pericardial fluid, thickening or
pericardial calcification. There is aortic atherosclerosis, as well
as atherosclerosis of the great vessels of the mediastinum and the
coronary arteries, including calcified atherosclerotic plaque in the
left main, left anterior descending, left circumflex and right
coronary arteries. Severe thickening and calcification of the aortic
valve.

Mediastinum/Lymph Nodes: No pathologically enlarged mediastinal or
hilar lymph nodes. Large hiatal hernia. No axillary lymphadenopathy.

Lungs/Pleura: Cluster of peribronchovascular micronodularity in the
periphery of the right upper lobe, nonspecific but statistically
likely to reflect benign areas of mucoid impaction within the
terminal bronchioles. No confluent consolidative airspace disease.
No pleural effusions. Mosaic attenuation throughout the lungs
bilaterally suggestive of a background of air trapping from small
airways disease. Thin-walled cyst in the medial aspect of the right
lower lobe incidentally noted.

Musculoskeletal/Soft Tissues: There are no aggressive appearing
lytic or blastic lesions noted in the visualized portions of the
skeleton.

CTA ABDOMEN AND PELVIS FINDINGS

Hepatobiliary: No suspicious cystic or solid hepatic lesions. No
intra or extrahepatic biliary ductal dilatation. Gallbladder is
normal in appearance.

Pancreas: No pancreatic mass. No pancreatic ductal dilatation. No
pancreatic or peripancreatic fluid or inflammatory changes.

Spleen: Unremarkable.

Adrenals/Urinary Tract: Diffuse mild-to-moderate cortical thinning
in both kidneys. No suspicious renal lesions. No
hydroureteronephrosis. Urinary bladder is normal in appearance.
Bilateral adrenal glands are normal in appearance.

Stomach/Bowel: Intra-abdominal portion of the stomach is
unremarkable. No pathologic dilatation of small bowel or colon.
Numerous colonic diverticulae are noted, without surrounding
inflammatory changes to suggest an acute diverticulitis at this
time. Normal appendix.

Vascular/Lymphatic: Aortic atherosclerosis, without evidence of
aneurysm or dissection in the abdominal or pelvic vasculature.
Vascular findings and measurements pertinent to potential TAVR
procedure, as detailed below. Celiac axis and inferior mesenteric
artery are both widely patent without hemodynamically significant
stenosis. Moderate stenosis of the ostium of the superior mesenteric
artery. Two right and one left renal arteries are all widely patent
without hemodynamically significant stenosis. No lymphadenopathy
noted in the abdomen or pelvis.

Reproductive: Uterus and ovaries are unremarkable in appearance.

Other: No significant volume of ascites.  No pneumoperitoneum.

Musculoskeletal: There are no aggressive appearing lytic or blastic
lesions noted in the visualized portions of the skeleton.

VASCULAR MEASUREMENTS PERTINENT TO TAVR:

AORTA:

Minimal Aortic Fiameter-S x 8 mm

Severity of Aortic Calcification-severe

RIGHT PELVIS:

Right Common Iliac Artery -

Minimal Niameter-A.Q x 6.6 mm

Tortuosity-mild

Calcification-moderate

Right External Iliac Artery -

Minimal 0iameter-H.F x 5.7 mm

Tortuosity-mild

Calcification-none

Right Common Femoral Artery -

Minimal Eiameter-3.M x 4.0 mm

Tortuosity-mild

Calcification-mild

LEFT PELVIS:

Left Common Iliac Artery -

Minimal Jiameter-Q.R x 5.8 mm

Tortuosity-mild

Calcification-moderate

Left External Iliac Artery -

Minimal Fiameter-2.J x 5.3 mm

Tortuosity-mild

Calcification-none

Left Common Femoral Artery -

Minimal 3iameter-8.W x 5.3 mm

Tortuosity-mild

Calcification-mild

Review of the MIP images confirms the above findings.
IMPRESSION: 1. Vascular findings and measurements pertinent to potential TAVR
procedure, as detailed above.
2. Severe thickening calcification of the aortic valve, compatible
with patient's reported clinical history of severe aortic stenosis.
3. Aortic atherosclerosis, in addition to left main and 3 vessel
coronary artery disease.
4. Large hiatal hernia. This may have implications at the time of
upcoming TAVR procedure.
5. Cardiomegaly with mild left atrial dilatation.
6. Mild colonic diverticulosis without evidence to suggest an acute
diverticulitis at this time.
7. Additional incidental findings, as above.

Aortic Atherosclerosis ([id number]-GN6.6).

## 2019-06-23 DIAGNOSIS — I10 Essential (primary) hypertension: Secondary | ICD-10-CM | POA: Diagnosis not present

## 2019-06-23 DIAGNOSIS — E7800 Pure hypercholesterolemia, unspecified: Secondary | ICD-10-CM | POA: Diagnosis not present

## 2019-06-24 LAB — BASIC METABOLIC PANEL
BUN/Creatinine Ratio: 23 (ref 12–28)
BUN: 25 mg/dL (ref 8–27)
CO2: 19 mmol/L — ABNORMAL LOW (ref 20–29)
Calcium: 9.1 mg/dL (ref 8.7–10.3)
Chloride: 106 mmol/L (ref 96–106)
Creatinine, Ser: 1.1 mg/dL — ABNORMAL HIGH (ref 0.57–1.00)
GFR calc Af Amer: 53 mL/min/{1.73_m2} — ABNORMAL LOW (ref >59)
GFR calc non Af Amer: 46 mL/min/{1.73_m2} — ABNORMAL LOW (ref >59)
Glucose: 136 mg/dL — ABNORMAL HIGH (ref 65–99)
Potassium: 4.5 mmol/L (ref 3.5–5.2)
Sodium: 142 mmol/L (ref 134–144)

## 2019-06-24 LAB — LIPID PANEL WITH LDL/HDL RATIO
Cholesterol, Total: 238 mg/dL — ABNORMAL HIGH (ref 100–199)
HDL: 66 mg/dL (ref >39)
LDL Calculated: 154 mg/dL — ABNORMAL HIGH (ref 0–99)
LDl/HDL Ratio: 2.3 ratio (ref 0.0–3.2)
Triglycerides: 92 mg/dL (ref 0–149)
VLDL Cholesterol Cal: 18 mg/dL (ref 5–40)

## 2019-06-29 ENCOUNTER — Encounter: Payer: Self-pay | Admitting: Podiatry

## 2019-06-29 ENCOUNTER — Ambulatory Visit: Payer: Medicare Other | Admitting: Podiatry

## 2019-06-29 ENCOUNTER — Other Ambulatory Visit: Payer: Self-pay

## 2019-06-29 VITALS — BP 125/67 | HR 103 | Temp 98.0°F

## 2019-06-29 DIAGNOSIS — L600 Ingrowing nail: Secondary | ICD-10-CM

## 2019-06-29 MED ORDER — NEOMYCIN-POLYMYXIN-HC 3.5-10000-1 OT SOLN: OTIC | 0 refills | Status: DC

## 2019-06-29 NOTE — Progress Notes (Signed)
Subjective:   Patient ID: Mallory Thomas, female   DOB: 85 y.o.   MRN: [id number]   HPI Patient states the right big toenail the border has become very tender and the left big toenail is loose and she is concerned about long-term trauma.  States the right foot has been very sure to begin hard for her to wear shoes and she is tried to soak it and trim it herself.  Patient does not smoke likes to be active and swims every day   Review of Systems  All other systems reviewed and are negative.       Objective:  Physical Exam Vitals signs and nursing note reviewed.  Constitutional:      Appearance: She is well-developed.  Pulmonary:     Effort: Pulmonary effort is normal.  Musculoskeletal: Normal range of motion.  Skin:    General: Skin is warm.  Neurological:     Mental Status: She is alert.     Neurovascular status found to be intact muscle strength found to be adequate range of motion within normal limits.  I did check and found that she has good digital perfusion toes are warm and on the right hallux medial border is incurvated and painful on the medial border with no redness or drainage noted.  The left hallux nail is loose but not painful     Assessment:  Ingrown toenail deformity right hallux medial border with pain with left hallux mildly loose but not painful     Plan:  H&P discussed both conditions great length of her get a focus on the right nail.  I did go ahead today and I recommended correction of the nail border and explained procedure risk and today I went ahead and had her sign consent form.  I infiltrated the right hallux 60 mg like Marcaine mixture remove the medial border with sterile instrumentation exposed matrix and applied phenol 3 applications 30 seconds followed by alcohol lavage and sterile dressing.  Gave instructions for soaks and reappoint

## 2019-06-29 NOTE — Patient Instructions (Signed)

## 2019-06-29 NOTE — Progress Notes (Signed)
Unable to reach pt

## 2019-07-12 ENCOUNTER — Other Ambulatory Visit: Payer: Self-pay

## 2019-07-12 ENCOUNTER — Ambulatory Visit
Admission: RE | Admit: 2019-07-12 | Discharge: 2019-07-12 | Disposition: A | Discharge status: Home / Self Care | Payer: Medicare Other | Source: Ambulatory Visit | Attending: Oncology | Admitting: Oncology

## 2019-07-12 ENCOUNTER — Telehealth: Payer: Self-pay | Admitting: Oncology

## 2019-07-12 DIAGNOSIS — Z1231 Encounter for screening mammogram for malignant neoplasm of breast: Secondary | ICD-10-CM | POA: Diagnosis not present

## 2019-07-12 NOTE — Telephone Encounter (Signed)
Contacted patient to verify telephone visit for pre reg °

## 2019-07-12 NOTE — Progress Notes (Signed)
ID: Mallory Thomas   DOB: 22-Feb-1932  MR#: [id number]  PVX#:[id number]  Patient Care Team: Jani Gravel, MD as PCP - General (Internal Medicine) Mariany Mackintosh, Virgie Dad, MD (Hematology and Oncology) Dedric Ethington, Virgie Dad, MD as Consulting Physician (Oncology) Clarene Essex, MD as Consulting Physician (Gastroenterology) Adrian Prows, MD as Consulting Physician (Cardiology) Sherren Mocha, MD as Consulting Physician (Cardiology)  OTHER MD:  I connected with Mallory Thomas on 07/13/19 at  9:00 AM EDT by telephone visit and verified that I am speaking with the correct person using two identifiers.   I discussed the limitations, risks, security and privacy concerns of performing an evaluation and management service by telemedicine and the availability of in-person appointments. I also discussed with the patient that there may be a patient responsible charge related to this service. The patient expressed understanding and agreed to proceed.   Other persons participating in the visit and their role in the encounter:    Patient's location: home  Provider's location: [place]: Right-sided breast cancer  CURRENT TREATMENT: Observation   INTERVAL HISTORY: Mallory Thomas returns today for follow-up of her remote estrogen receptor positive breast cancer. She continues under observation.  Since her last visit, she underwent bilateral diagnostic mammography with tomography at [facility] yesterday, 07/12/2019.  Results are still pending  Since her last visit, she underwent echocardiogram on 03/29/2019, which showed an ejection fraction of 60-65%. She is followed by Dr. Burt Knack in pulmonology.  She also continues close follow up with Dr. Einar Gip in cardiology.   REVIEW OF SYSTEMS: Mallory Thomas does water aerobics most days.  She recently cut 1 of her toenails a little bit too close, needed to go on local antibiotics and she has been banned from the pool for another week.   She is taking appropriate pandemic precautions and her family is very careful.  She is working on her lipids and blood pressure and sugar through her all other physicians.  Aside from all these issues a detailed review systems today was stable   PAST Mallory HISTORY: Past Mallory History:  Diagnosis Date  . Aortic stenosis   . Breast cancer ([place]) 2004   right  . Diabetes mellitus   . Heart murmur   . Hyperlipidemia   . Hypertension   . 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 ABDOMINAL AORTOGRAM N/A 11/22/2018   Procedure: ABDOMINAL AORTOGRAM;  Surgeon: Adrian Prows, MD;  Location: [facility];  Service: Cardiovascular;  Laterality: N/A;  . BILATERAL KNEE ARTHROSCOPY    . BREAST LUMPECTOMY Right 2004   radiation   . CARDIAC CATHETERIZATION     12/24/17  . INTRAVASCULAR ULTRASOUND/IVUS N/A 12/24/2017   Procedure: Intravascular Ultrasound/IVUS;  Surgeon: Adrian Prows, MD;  Location: [facility];  Service: Cardiovascular;  Laterality: N/A;  . LOWER EXTREMITY ANGIOGRAPHY Bilateral 11/22/2018   Procedure: LOWER EXTREMITY ANGIOGRAPHY;  Surgeon: Adrian Prows, MD;  Location: [facility];  Service: Cardiovascular;  Laterality: Bilateral;  . lymph node removal right arm    . PERIPHERAL VASCULAR CATHETERIZATION N/A 11/10/2016   Procedure: Lower Extremity Angiography;  Surgeon: Adrian Prows, MD;  Location: [facility];  Service: Cardiovascular;  Laterality: N/A;  . PERIPHERAL VASCULAR CATHETERIZATION  Right 11/10/2016   Procedure: Peripheral Vascular Intervention;  Surgeon: Adrian Prows, MD;  Location: [facility];  Service: Cardiovascular;  Laterality: Right;  Rt Com Iliac  . TEE WITHOUT CARDIOVERSION N/A  02/15/2018   Procedure: TRANSESOPHAGEAL ECHOCARDIOGRAM (TEE);  Surgeon: Sherren Mocha, MD;  Location: [facility];  Service: Open Heart Surgery;  Laterality: N/A;  . TONSILLECTOMY    . TRANSCATHETER AORTIC VALVE REPLACEMENT, TRANSFEMORAL N/A 02/15/2018   Procedure: TRANSCATHETER AORTIC VALVE REPLACEMENT, TRANSFEMORAL;  Surgeon: Sherren Mocha, MD;  Location: [facility];  Service: Open Heart Surgery;  Laterality: N/A;    FAMILY HISTORY Family History  Problem Relation Age of Onset  . Heart attack Mother   . Heart attack Father   . Breast cancer Neg Hx   The patient's family were [employer].  Her father died at the age of 43 from a heart attack.  Her mother died at age 60, and she had a breast cancer diagnosed at age 70.  She had a mastectomy and Tamoxifen at that time.    GYNECOLOGIC HISTORY: She is G3, F3, P0, A0, L3.  Menopause age 82.  She never took hormones.   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  Her husband died in the year 01/23/99.  He had lung cancer and was taken care of by Dr. Ralene Ok.  Her children are Mallory Thomas (who lives in [place] and sells medications to labs); Mallory Thomas (who lives in [place] and is in [employer]); and Mallory Thomas (who lives in [place] and is currently a homemaker.).  The patient has three grandsons: Mallory Thomas, 74; Mallory Thomas, 14; and Mallory Thomas; 8.  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  She lives alone with her cat Mallory Thomas   ADVANCED DIRECTIVES: In place   HEALTH MAINTENANCE: Social History   Tobacco Use  . Smoking status: Former Smoker    Years: 15.00    Types: Cigarettes    Quit date: 06/04/1968    Years since quitting: 51.1  . Smokeless tobacco: Never Used  Substance Use Topics  . Alcohol use: Yes    Frequency: Never    Comment: occasional  . Drug use: No     Colonoscopy: 11/01/2015/ [facility]  PAP:  Bone density: 01-23-2013  Lipid panel:  Allergies  Allergen Reactions   . Fish Allergy Hives  . Iodine Hives    Allergic to ALL seafood, shrimp, crab etc.  . Shellfish Allergy Hives  . Olmesartan Other (See Comments)    Hyperkalemia  . Tape Hives  . Zetia [Ezetimibe] Diarrhea    Current Outpatient Medications  Medication Sig Dispense Refill  . amLODipine (NORVASC) 5 MG tablet Take 5 mg by mouth daily.    Marland Kitchen aspirin 81 MG tablet Take 81 mg by mouth at bedtime.     Marland Kitchen atorvastatin (LIPITOR) 10 MG tablet Take 10 mg by mouth daily.    . colestipol (COLESTID) 1 g tablet Take 1 g by mouth 3 (three) times daily before meals.     . Evolocumab (REPATHA SURECLICK) 841 MG/ML SOAJ Inject 1 pen into the skin every 14 (fourteen) days. (Patient not taking: Reported on 06/29/2019) 6 pen 3  . hydrochlorothiazide (MICROZIDE) 12.5 MG capsule Take 1 capsule (12.5 mg total) by mouth daily. 90 capsule 6  . linagliptin (TRADJENTA) 5 MG TABS tablet Take 5 mg by mouth daily.    Marland Kitchen neomycin-polymyxin-hydrocortisone (CORTISPORIN) OTIC solution Apply 1-2 drops to  toe after soaking twice a day 10 mL 0   No current facility-administered medications for this visit.     OBJECTIVE: Older white woman in no acute distress  There were no vitals filed for this visit.   There is no height or weight on file to calculate BMI.    Lungs no  Telemedicine  LAB RESULTS: Lab Results  Component Value Date   WBC 3.8 (L) 06/30/2018   NEUTROABS 2.2 06/30/2018   HGB 11.3 (L) 06/30/2018   HCT 34.9 06/30/2018   MCV 95.1 06/30/2018   PLT 128 (L) 06/30/2018      Chemistry      Component Value Date/Time   NA 142 06/23/2019 0812   NA 141 06/24/2017 1333   K 4.5 06/23/2019 0812   K 4.8 06/24/2017 1333   CL 106 06/23/2019 0812   CO2 19 (L) 06/23/2019 0812   CO2 26 06/24/2017 1333   BUN 25 06/23/2019 0812   BUN 25.6 06/24/2017 1333   CREATININE 1.10 (H) 06/23/2019 0812   CREATININE 1.3 (H) 06/24/2017 1333      Component Value Date/Time   CALCIUM 9.1 06/23/2019 0812   CALCIUM 9.1 06/24/2017  1333   ALKPHOS 45 06/30/2018 1249   ALKPHOS 50 06/24/2017 1333   AST 15 06/30/2018 1249   AST 15 06/24/2017 1333   ALT 6 06/30/2018 1249   ALT 7 06/24/2017 1333   BILITOT 0.4 06/30/2018 1249   BILITOT 0.43 06/24/2017 1333       Lab Results  Component Value Date   LABCA2 13 05/17/2008    No components found for: [id number]  No results for input(s): INR in the last 168 hours.  Urinalysis    Component Value Date/Time   COLORURINE STRAW (A) 02/11/2018 1208    STUDIES: No results found.   ASSESSMENT: 85 y.o. [place] woman status post right lumpectomy and sentinel lymph node biopsy June 2004 for a 1.2 cm estrogen and progesterone receptor positive, HER2/neu negative, grade 2 invasive ductal carcinoma, lymph node negative,   (1) on anastrozole between July 2004 and July 2009 through the MA27 study, note that the study still is unblinded so we do not know if she took 5 or 10 years of aromatase inhibitors  (2) enrolled in the B42 study as of July 2009 and received either placebo or letrozole.   (3) normal bone density 09/13/2013 at [facility].  (4) CKD stage II-III  PLAN:  Mallory Thomas is now 16 years out from definitive surgery for her breast cancer with no evidence of disease recurrence.  This is very favorable.  I do not have the results of her mammogram from yesterday.  I am going to see her in September next year to make sure that the mammography results are available by the time I see her.  She is doing a great job at staying healthy and safe  She will see me again in a year.  She knows to call for any other issue that may develop before the next visit.   Mallory Thomas, Virgie Dad, MD  07/13/19 9:02 AM [facility] [address] [place], [place] [address] Tel. [phone]    Fax. [phone]   I, Wilburn Mylar, am acting as scribe for Dr. Virgie Dad. Mallory Thomas.

## 2019-07-13 ENCOUNTER — Encounter: Payer: Self-pay | Admitting: Oncology

## 2019-07-13 ENCOUNTER — Inpatient Hospital Stay: Payer: Medicare Other | Attending: Oncology | Admitting: Oncology

## 2019-07-13 ENCOUNTER — Other Ambulatory Visit: Payer: Medicare Other

## 2019-07-13 DIAGNOSIS — I10 Essential (primary) hypertension: Secondary | ICD-10-CM | POA: Diagnosis not present

## 2019-07-13 DIAGNOSIS — Z79899 Other long term (current) drug therapy: Secondary | ICD-10-CM

## 2019-07-13 DIAGNOSIS — Z170 Estrogen receptor positive status [ER+]: Secondary | ICD-10-CM

## 2019-07-13 DIAGNOSIS — E785 Hyperlipidemia, unspecified: Secondary | ICD-10-CM

## 2019-07-13 DIAGNOSIS — Z87891 Personal history of nicotine dependence: Secondary | ICD-10-CM

## 2019-07-13 DIAGNOSIS — N183 Chronic kidney disease, stage 3 unspecified: Secondary | ICD-10-CM

## 2019-07-13 DIAGNOSIS — C50811 Malignant neoplasm of overlapping sites of right female breast: Secondary | ICD-10-CM

## 2019-07-13 DIAGNOSIS — Z853 Personal history of malignant neoplasm of breast: Secondary | ICD-10-CM | POA: Diagnosis not present

## 2019-07-14 ENCOUNTER — Telehealth: Payer: Self-pay | Admitting: Oncology

## 2019-07-14 NOTE — Telephone Encounter (Signed)
I talk with patient regarding schedule  

## 2019-08-02 DIAGNOSIS — E7800 Pure hypercholesterolemia, unspecified: Secondary | ICD-10-CM | POA: Diagnosis not present

## 2019-08-02 DIAGNOSIS — E119 Type 2 diabetes mellitus without complications: Secondary | ICD-10-CM | POA: Diagnosis not present

## 2019-08-02 DIAGNOSIS — I10 Essential (primary) hypertension: Secondary | ICD-10-CM | POA: Diagnosis not present

## 2019-08-08 ENCOUNTER — Other Ambulatory Visit: Payer: Self-pay

## 2019-08-08 NOTE — Patient Outreach (Signed)
[facility] Care Management  08/08/2019  Mallory Thomas 04/13/1932 [id number]   Medication Adherence call to [name] spoke with patient she is past due on Olmesartan 40 mg patient explain she is no longer taking this medication she is now taking Amlodipine 5 mg. [name] is showing past due under [facility].   [facility] Management Direct Dial [phone]  Fax [phone] [email or web address]

## 2019-10-25 DIAGNOSIS — E7800 Pure hypercholesterolemia, unspecified: Secondary | ICD-10-CM | POA: Diagnosis not present

## 2019-10-25 DIAGNOSIS — I10 Essential (primary) hypertension: Secondary | ICD-10-CM | POA: Diagnosis not present

## 2019-10-25 DIAGNOSIS — E119 Type 2 diabetes mellitus without complications: Secondary | ICD-10-CM | POA: Diagnosis not present

## 2019-11-01 DIAGNOSIS — Z7982 Long term (current) use of aspirin: Secondary | ICD-10-CM | POA: Diagnosis not present

## 2019-11-01 DIAGNOSIS — I10 Essential (primary) hypertension: Secondary | ICD-10-CM | POA: Diagnosis not present

## 2019-11-01 DIAGNOSIS — E1165 Type 2 diabetes mellitus with hyperglycemia: Secondary | ICD-10-CM | POA: Diagnosis not present

## 2019-11-01 DIAGNOSIS — E119 Type 2 diabetes mellitus without complications: Secondary | ICD-10-CM | POA: Diagnosis not present

## 2019-11-01 DIAGNOSIS — E7800 Pure hypercholesterolemia, unspecified: Secondary | ICD-10-CM | POA: Diagnosis not present

## 2019-11-02 DIAGNOSIS — H52203 Unspecified astigmatism, bilateral: Secondary | ICD-10-CM | POA: Diagnosis not present

## 2019-11-02 DIAGNOSIS — E119 Type 2 diabetes mellitus without complications: Secondary | ICD-10-CM | POA: Diagnosis not present

## 2019-11-02 DIAGNOSIS — H524 Presbyopia: Secondary | ICD-10-CM | POA: Diagnosis not present

## 2019-11-02 DIAGNOSIS — H353131 Nonexudative age-related macular degeneration, bilateral, early dry stage: Secondary | ICD-10-CM | POA: Diagnosis not present

## 2019-11-02 DIAGNOSIS — H5203 Hypermetropia, bilateral: Secondary | ICD-10-CM | POA: Diagnosis not present

## 2019-11-22 DIAGNOSIS — E7800 Pure hypercholesterolemia, unspecified: Secondary | ICD-10-CM | POA: Diagnosis not present

## 2019-11-22 DIAGNOSIS — E1165 Type 2 diabetes mellitus with hyperglycemia: Secondary | ICD-10-CM | POA: Diagnosis not present

## 2019-11-22 DIAGNOSIS — I10 Essential (primary) hypertension: Secondary | ICD-10-CM | POA: Diagnosis not present

## 2019-11-28 ENCOUNTER — Encounter: Payer: Self-pay | Admitting: Cardiology

## 2019-12-06 ENCOUNTER — Other Ambulatory Visit: Payer: Self-pay | Admitting: Cardiology

## 2019-12-06 ENCOUNTER — Other Ambulatory Visit: Payer: Self-pay

## 2019-12-06 ENCOUNTER — Ambulatory Visit: Payer: Medicare Other | Admitting: Cardiology

## 2019-12-06 ENCOUNTER — Encounter: Payer: Self-pay | Admitting: Cardiology

## 2019-12-06 VITALS — BP 138/81 | HR 85 | Ht 62.0 in | Wt 158.0 lb

## 2019-12-06 DIAGNOSIS — E7800 Pure hypercholesterolemia, unspecified: Secondary | ICD-10-CM

## 2019-12-06 DIAGNOSIS — Z952 Presence of prosthetic heart valve: Secondary | ICD-10-CM | POA: Diagnosis not present

## 2019-12-06 DIAGNOSIS — I739 Peripheral vascular disease, unspecified: Secondary | ICD-10-CM

## 2019-12-06 DIAGNOSIS — I25118 Atherosclerotic heart disease of native coronary artery with other forms of angina pectoris: Secondary | ICD-10-CM | POA: Diagnosis not present

## 2019-12-06 DIAGNOSIS — I10 Essential (primary) hypertension: Secondary | ICD-10-CM

## 2019-12-06 NOTE — Progress Notes (Signed)
Primary Physician/Referring:  Jani Gravel, MD  Patient ID: Mallory Thomas, female    DOB: 14-Jan-1932, 86 y.o.   MRN: [id number]  Chief Complaint  Patient presents with  . PAD  . Coronary Artery Disease   HPI:    Mallory Thomas  is a 86 y.o. Caucasian female  with history of breast cancer status post radiation therapy he and lumpectomy on the right breast in 2000 hypertension, diabetes mellitus, hyperlipidemia, peripheral arterial disease with right iliac artery stenting in 2017, patent by angiography on 11/29/2018, coronary artery disease with ostial RCA 70-80% stenosis by angiography on 12/24/2017, TAVR for critical AS on 02/15/2018 and presents here for follow-up.  Patient had severe myalgias and once progression of PAD was excluded, high-dose atorvastatin was discontinued and she was placed on 10 mg of atorvastatin. Now on Repatha.  Otherwise remains asymptomatic and continues to exercise regularly.  She has lost 30 Lbs over 6 months with exercise and diet.  No chest pain or dyspnea.   Past Medical History:  Diagnosis Date  . Aortic stenosis   . Breast cancer ([place]) 2004   right  . Diabetes mellitus   . Heart murmur   . Hyperlipidemia   . Hypertension   . S/P TAVR (transcatheter aortic valve replacement) 02/15/2018   23 mm Edwards Sapien 3 transcatheter heart valve placed via percutaneous right transfemoral approach    Past Surgical History:  Procedure Laterality Date  . ABDOMINAL AORTOGRAM N/A 11/22/2018   Procedure: ABDOMINAL AORTOGRAM;  Surgeon: Adrian Prows, MD;  Location: [facility];  Service: Cardiovascular;  Laterality: N/A;  . BILATERAL KNEE ARTHROSCOPY    . BREAST LUMPECTOMY Right 2004   radiation   . CARDIAC CATHETERIZATION     12/24/17  . INTRAVASCULAR ULTRASOUND/IVUS N/A 12/24/2017   Procedure: Intravascular Ultrasound/IVUS;  Surgeon: Adrian Prows, MD;  Location: [facility];  Service: Cardiovascular;  Laterality: N/A;  . LOWER EXTREMITY ANGIOGRAPHY  Bilateral 11/22/2018   Procedure: LOWER EXTREMITY ANGIOGRAPHY;  Surgeon: Adrian Prows, MD;  Location: [facility];  Service: Cardiovascular;  Laterality: Bilateral;  . lymph node removal right arm    . PERIPHERAL VASCULAR CATHETERIZATION N/A 11/10/2016   Procedure: Lower Extremity Angiography;  Surgeon: Adrian Prows, MD;  Location: [facility];  Service: Cardiovascular;  Laterality: N/A;  . PERIPHERAL VASCULAR CATHETERIZATION Right 11/10/2016   Procedure: Peripheral Vascular Intervention;  Surgeon: Adrian Prows, MD;  Location: [facility];  Service: Cardiovascular;  Laterality: Right;  Rt Com Iliac  . TEE WITHOUT CARDIOVERSION N/A 02/15/2018   Procedure: TRANSESOPHAGEAL ECHOCARDIOGRAM (TEE);  Surgeon: Sherren Mocha, MD;  Location: [facility];  Service: Open Heart Surgery;  Laterality: N/A;  . TONSILLECTOMY    . TRANSCATHETER AORTIC VALVE REPLACEMENT, TRANSFEMORAL N/A 02/15/2018   Procedure: TRANSCATHETER AORTIC VALVE REPLACEMENT, TRANSFEMORAL;  Surgeon: Sherren Mocha, MD;  Location: [facility];  Service: Open Heart Surgery;  Laterality: N/A;   Social History   Tobacco Use  . Smoking status: Former Smoker    Years: 15.00    Types: Cigarettes    Quit date: 06/04/1968    Years since quitting: 51.5  . Smokeless tobacco: Never Used  Substance Use Topics  . Alcohol use: Yes    Comment: occasional   ROS  Review of Systems  Constitution: Positive for weight loss (intentional). Negative for chills, decreased appetite, malaise/fatigue and weight gain.  Cardiovascular: Negative for chest pain, claudication, dyspnea on exertion, leg swelling and syncope.  Endocrine: Negative  for cold intolerance.  Hematologic/Lymphatic: Does not bruise/bleed easily.  Musculoskeletal: Negative for joint pain and joint swelling.  Gastrointestinal: Negative for abdominal pain, anorexia, change in bowel habit, hematochezia and melena.  Neurological: Negative for headaches and light-headedness.   Psychiatric/Behavioral: Negative for depression and substance abuse.   Objective  Blood pressure (!) 147/81, pulse 85, height '5\' 2"'$  (1.575 m), weight 158 lb (71.7 kg), SpO2 99 %.  Vitals with BMI 12/06/2019 06/29/2019 06/02/2019  Height '5\' 2"'$  - '5\' 2"'$   Weight 158 lbs - 182 lbs  BMI 58.52 - 77.82  Systolic 423 536 144  Diastolic 81 67 65  Pulse 85 103 107     Physical Exam  Constitutional:  Moderately build and well nourished  HENT:  Head: Atraumatic.  Eyes: Conjunctivae are normal.  Neck: No thyromegaly present.  Cardiovascular: Normal rate, regular rhythm, S1 normal, S2 normal and intact distal pulses. Exam reveals no gallop.  Murmur heard. Pulses:      Carotid pulses are on the left side with bruit.      Radial pulses are 2+ on the right side and 2+ on the left side.       Femoral pulses are 2+ on the right side and 2+ on the left side.      Popliteal pulses are 2+ on the right side and 2+ on the left side.       Dorsalis pedis pulses are 1+ on the right side and 1+ on the left side.       Posterior tibial pulses are 1+ on the right side and 1+ on the left side.  No JVD, No leg edema  Pulmonary/Chest: Effort normal and breath sounds normal.  Abdominal: Soft. Bowel sounds are normal.  Musculoskeletal:        General: Normal range of motion.     Cervical back: Neck supple.  Skin: Skin is warm and dry.   Laboratory examination:   Recent Labs    06/23/19 0812  NA 142  K 4.5  CL 106  CO2 19*  GLUCOSE 136*  BUN 25  CREATININE 1.10*  CALCIUM 9.1  GFRNONAA 46*  GFRAA 53*   CrCl cannot be calculated (Patient's most recent lab result is older than the maximum 21 days allowed.).  CMP Latest Ref Rng & Units 06/23/2019 06/30/2018 03/10/2018  Glucose 65 - 99 mg/dL 136(H) 87 118(H)  BUN 8 - 27 mg/dL 25 30(H) 24  Creatinine 0.57 - 1.00 mg/dL 1.10(H) 1.08(H) 1.00  Sodium 134 - 144 mmol/L 142 142 141  Potassium 3.5 - 5.2 mmol/L 4.5 5.9(H) 5.1  Chloride 96 - 106 mmol/L 106 111 106   CO2 20 - 29 mmol/L 19(L) 23 20  Calcium 8.7 - 10.3 mg/dL 9.1 8.8(L) 9.0  Total Protein 6.5 - 8.1 g/dL - 6.9 -  Total Bilirubin 0.3 - 1.2 mg/dL - 0.4 -  Alkaline Phos 38 - 126 U/L - 45 -  AST 15 - 41 U/L - 15 -  ALT 0 - 44 U/L - 6 -   CBC Latest Ref Rng & Units 06/30/2018 03/03/2018 02/16/2018  WBC 3.9 - 10.3 K/uL 3.8(L) 5.5 5.5  Hemoglobin 11.6 - 15.9 g/dL 11.3(L) 11.3 9.4(L)  Hematocrit 34.8 - 46.6 % 34.9 33.6(L) 28.6(L)  Platelets 145 - 400 K/uL 128(L) 209 109(L)   Lipid Panel     Component Value Date/Time   CHOL 238 (H) 06/23/2019 0812   TRIG 92 06/23/2019 0812   HDL 66 06/23/2019 0812   CHOLHDL  3.3 05/14/2009 0915   VLDL 13 05/14/2009 0915   LDLCALC 154 (H) 06/23/2019 0812   HEMOGLOBIN A1C Lab Results  Component Value Date   HGBA1C 6.7 (H) 02/11/2018   MPG 145.59 02/11/2018   A1C 7.200 % 10/25/2019  TSH No results for input(s): TSH in the last 8760 hours. TSH 3.850 11/14/2018  11/15/2018: RBC 3.61, normal H&H, platelets minimally decreased at 147, CBC otherwise normal. Glucose 112, creatinine 1.13, EGFR 45/52, potassium 5.3, CMP otherwise normal. TSH normal at 3.8.   Cholesterol 176, triglycerides 74, HDL 76, LDL 85. Hemoglobin A1c 6.2%.  Medications and allergies   Allergies  Allergen Reactions  . Fish Allergy Hives  . Iodine Hives    Allergic to ALL seafood, shrimp, crab etc.  . Shellfish Allergy Hives  . Olmesartan Other (See Comments)    Hyperkalemia  . Tape Hives  . Zetia [Ezetimibe] Diarrhea     Current Outpatient Medications  Medication Instructions  . amLODipine (NORVASC) 5 mg, Oral, Daily  . aspirin 81 mg, Oral, Daily at bedtime  . atorvastatin (LIPITOR) 10 mg, Oral, Daily  . colestipol (COLESTID) 1 g, Oral, 3 times daily before meals  . Evolocumab (REPATHA SURECLICK) 497 MG/ML SOAJ 1 pen, Subcutaneous, Every 14 days  . hydrochlorothiazide (MICROZIDE) 12.5 mg, Oral, Daily  . linagliptin (TRADJENTA) 5 mg, Oral, Daily   Radiology:  No results  found.  Cardiac Studies:   Coronary angiogram 12/24/2017:  Severe diffuse coronary calcification. RCA ostial 70-80% and mid 80% by IVUS. Mild disease Cx & LAD. EKG 10/05/2018: Normal sinus rhythm at rate of 67 bpm, normal axis. No evidence of ischemia, normal EKG.  Carotid artery duplex 02/80/2019: Minimal stenosis and bilateral ICA.  Less than 50% stenosis left common carotid artery.  TAVR 02/15/2018: Edwards Sapien 3 THV (size 23 mm, model # 9600TFX, serial # [id number])  Echocardiogram 03/31/2018:  Normal LV systolic function, moderate LVH, LVEF 65-70%. Grade 2 diastolic dysfunction. Bioprosthetic aortic valve was present at mild mitral regurgitation.  Abdominal aortic duplex 10/27/2018: Normal abdominal aorta. No evidence of aneurysm. Peak systolic velocities in the mid and distal aorta are moderately increased to 245.72 cm/s. suggests > 50% stenosis. Peak systolic velocities in the right internal iliac artery are severely increased to 303.74 cm/s. Peak systolic velocities in the left internal iliac artery are moderately increased to 259.37 cm/s. suggestive of >50% stenosis. Compared to 06/16/2017, previously normal velocity. Consider further work up if clinically indicated  Peripheral arteriogram 11/29/2018: Distal aorta diffuse 30% stenosis. Right renal superior pole 95% stenosis. Right CIA 80% to 0% with 10x30 mm self expanding Absolute Pro placed on 11/10/2018 widely patent. Left mild disease.  Assessment     ICD-10-CM   1. Coronary artery disease involving native coronary artery with other form of angina pectoris, unspecified whether native or transplanted heart ([place])  I25.118 EKG 12-Lead  2. S/P TAVR (transcatheter aortic valve replacement)  Z95.2   3. Hypercholesteremia  E78.00 Lipid Panel With LDL/HDL Ratio  4. PAD (peripheral artery disease) ([facility])  I73.9   5. Essential hypertension  I10 CMP14+EGFR    EKG 12/06/2019: Normal sinus rhythm at rate of 80 bpm, left atrial  enlargement, otherwise normal EKG.  No significant change from 06/02/2019.  No orders of the defined types were placed in this encounter.   Medications Discontinued During This Encounter  Medication Reason  . neomycin-polymyxin-hydrocortisone (CORTISPORIN) OTIC solution Completed Course     Recommendations:   Mallory Thomas  is a 86 y.o. Caucasian female  with history of breast cancer status post radiation therapy he and lumpectomy on the right breast in 2000 hypertension, diabetes mellitus, hyperlipidemia, peripheral arterial disease with right iliac artery stenting in 2017, patent by angiography on 11/29/2018, coronary artery disease with ostial RCA 70-80% stenosis by angiography on 12/24/2017, TAVR for critical AS on 02/15/2018 and presents here for follow-up.  She is here on a six-month office visit and follow-up, she has lost about 30 pounds in weight with diet and exercise, looks well. No angina.  No clinical evidence of heart failure.  Past examination is improved significantly as well.  She does not have any further right carotid bruit although she does have left carotid bruit that was noted previously.   Her labs were reviewed, she needs lipid profile testing along with CMP.  Diabetes although uncontrolled, overall stable.  Blood pressure at home recordings have been excellent.  Hence I did not make any changes to her medications.  We will consider repeating carotid artery duplex in a year.  I'll see her back in one year or sooner if problems.  Adrian Prows, MD, [facility] 12/06/2019, 11:38 AM [facility]. [place] Office: [phone]

## 2019-12-08 DIAGNOSIS — I10 Essential (primary) hypertension: Secondary | ICD-10-CM | POA: Diagnosis not present

## 2019-12-08 DIAGNOSIS — E7800 Pure hypercholesterolemia, unspecified: Secondary | ICD-10-CM | POA: Diagnosis not present

## 2019-12-09 LAB — CMP14+EGFR
ALT: 6 IU/L (ref 0–32)
AST: 16 IU/L (ref 0–40)
Albumin/Globulin Ratio: 1.9 (ref 1.2–2.2)
Albumin: 4.5 g/dL (ref 3.6–4.6)
Alkaline Phosphatase: 55 IU/L (ref 39–117)
BUN/Creatinine Ratio: 23 (ref 12–28)
BUN: 25 mg/dL (ref 8–27)
Bilirubin Total: 0.4 mg/dL (ref 0.0–1.2)
CO2: 24 mmol/L (ref 20–29)
Calcium: 8.6 mg/dL — ABNORMAL LOW (ref 8.7–10.3)
Chloride: 105 mmol/L (ref 96–106)
Creatinine, Ser: 1.11 mg/dL — ABNORMAL HIGH (ref 0.57–1.00)
GFR calc Af Amer: 52 mL/min/{1.73_m2} — ABNORMAL LOW (ref >59)
GFR calc non Af Amer: 45 mL/min/{1.73_m2} — ABNORMAL LOW (ref >59)
Globulin, Total: 2.4 g/dL (ref 1.5–4.5)
Glucose: 123 mg/dL — ABNORMAL HIGH (ref 65–99)
Potassium: 5.2 mmol/L (ref 3.5–5.2)
Sodium: 143 mmol/L (ref 134–144)
Total Protein: 6.9 g/dL (ref 6.0–8.5)

## 2019-12-09 LAB — LIPID PANEL WITH LDL/HDL RATIO
Cholesterol, Total: 219 mg/dL — ABNORMAL HIGH (ref 100–199)
HDL: 119 mg/dL (ref >39)
LDL Chol Calc (NIH): 89 mg/dL (ref 0–99)
LDL/HDL Ratio: 0.7 ratio (ref 0.0–3.2)
Triglycerides: 63 mg/dL (ref 0–149)
VLDL Cholesterol Cal: 11 mg/dL (ref 5–40)

## 2019-12-09 NOTE — Progress Notes (Signed)
Cholesterol is further improved since weight loss. Continue present medications including repatha.   Renal function is stable with mild insufficiency.

## 2019-12-12 NOTE — Progress Notes (Signed)
LVM for patient to call back. ?

## 2020-01-29 DIAGNOSIS — E119 Type 2 diabetes mellitus without complications: Secondary | ICD-10-CM | POA: Diagnosis not present

## 2020-01-29 DIAGNOSIS — Z7982 Long term (current) use of aspirin: Secondary | ICD-10-CM | POA: Diagnosis not present

## 2020-01-29 DIAGNOSIS — I10 Essential (primary) hypertension: Secondary | ICD-10-CM | POA: Diagnosis not present

## 2020-01-29 DIAGNOSIS — E7800 Pure hypercholesterolemia, unspecified: Secondary | ICD-10-CM | POA: Diagnosis not present

## 2020-01-31 DIAGNOSIS — E7800 Pure hypercholesterolemia, unspecified: Secondary | ICD-10-CM | POA: Diagnosis not present

## 2020-01-31 DIAGNOSIS — E1165 Type 2 diabetes mellitus with hyperglycemia: Secondary | ICD-10-CM | POA: Diagnosis not present

## 2020-01-31 DIAGNOSIS — I10 Essential (primary) hypertension: Secondary | ICD-10-CM | POA: Diagnosis not present

## 2020-01-31 DIAGNOSIS — E119 Type 2 diabetes mellitus without complications: Secondary | ICD-10-CM | POA: Diagnosis not present

## 2020-03-21 DIAGNOSIS — L821 Other seborrheic keratosis: Secondary | ICD-10-CM | POA: Diagnosis not present

## 2020-03-21 DIAGNOSIS — D1801 Hemangioma of skin and subcutaneous tissue: Secondary | ICD-10-CM | POA: Diagnosis not present

## 2020-03-21 DIAGNOSIS — D692 Other nonthrombocytopenic purpura: Secondary | ICD-10-CM | POA: Diagnosis not present

## 2020-03-21 DIAGNOSIS — Z85828 Personal history of other malignant neoplasm of skin: Secondary | ICD-10-CM | POA: Diagnosis not present

## 2020-03-21 DIAGNOSIS — B078 Other viral warts: Secondary | ICD-10-CM | POA: Diagnosis not present

## 2020-03-21 DIAGNOSIS — D485 Neoplasm of uncertain behavior of skin: Secondary | ICD-10-CM | POA: Diagnosis not present

## 2020-04-29 DIAGNOSIS — M79671 Pain in right foot: Secondary | ICD-10-CM | POA: Diagnosis not present

## 2020-04-29 DIAGNOSIS — I10 Essential (primary) hypertension: Secondary | ICD-10-CM | POA: Diagnosis not present

## 2020-04-29 DIAGNOSIS — E119 Type 2 diabetes mellitus without complications: Secondary | ICD-10-CM | POA: Diagnosis not present

## 2020-04-29 DIAGNOSIS — E7800 Pure hypercholesterolemia, unspecified: Secondary | ICD-10-CM | POA: Diagnosis not present

## 2020-05-02 DIAGNOSIS — E7800 Pure hypercholesterolemia, unspecified: Secondary | ICD-10-CM | POA: Diagnosis not present

## 2020-05-02 DIAGNOSIS — M79671 Pain in right foot: Secondary | ICD-10-CM | POA: Diagnosis not present

## 2020-05-02 DIAGNOSIS — I10 Essential (primary) hypertension: Secondary | ICD-10-CM | POA: Diagnosis not present

## 2020-05-02 DIAGNOSIS — E119 Type 2 diabetes mellitus without complications: Secondary | ICD-10-CM | POA: Diagnosis not present

## 2020-06-21 ENCOUNTER — Other Ambulatory Visit: Payer: Self-pay | Admitting: Cardiology

## 2020-06-21 DIAGNOSIS — E7800 Pure hypercholesterolemia, unspecified: Secondary | ICD-10-CM

## 2020-06-25 DIAGNOSIS — N94819 Vulvodynia, unspecified: Secondary | ICD-10-CM | POA: Diagnosis not present

## 2020-06-25 DIAGNOSIS — Z853 Personal history of malignant neoplasm of breast: Secondary | ICD-10-CM | POA: Diagnosis not present

## 2020-07-29 NOTE — Progress Notes (Signed)
ID: Mallory Thomas   DOB: 12-25-1931  MR#: [id number]  PRF#:[id number]  Patient Care Team: Jani Gravel, MD as PCP - General (Internal Medicine) Terry Abila, Virgie Dad, MD (Hematology and Oncology) Martino Tompson, Virgie Dad, MD as Consulting Physician (Oncology) Clarene Essex, MD as Consulting Physician (Gastroenterology) Adrian Prows, MD as Consulting Physician (Cardiology) Sherren Mocha, MD as Consulting Physician (Cardiology) Carlyon Shadow, MD as Consulting Physician (Obstetrics and Gynecology)  OTHER MD:   CHIEF COMPLAINT: Right-sided breast cancer  CURRENT TREATMENT: Observation   INTERVAL HISTORY: Mallory Thomas returns today for follow-up of her remote estrogen receptor positive breast cancer. She continues under observation.  Since her last visit, she has not undergone any additional studies. Her most recent mammogram was on 07/12/2019 at [facility]   REVIEW OF SYSTEMS: Mallory Thomas [facility] living at the [facility], uses their facilities all the time, and swims most days.  She has had the Pfizer vaccine and tolerated it well.  She is having some vaginal labial burning and has been suggested she use coconut oil by Dr.Glasser.  A detailed review of systems today was otherwise stable.   PAST MEDICAL HISTORY: Past Medical History:  Diagnosis Date  . Aortic stenosis   . Breast cancer ([place]) 2004   right  . Diabetes mellitus   . Heart murmur   . Hyperlipidemia   . Hypertension   . 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 ABDOMINAL AORTOGRAM N/A 11/22/2018   Procedure: ABDOMINAL AORTOGRAM;  Surgeon: Adrian Prows, MD;  Location: [place];  Service: Cardiovascular;  Laterality: N/A;  . BILATERAL KNEE ARTHROSCOPY     . BREAST LUMPECTOMY Right 2004   radiation   . CARDIAC CATHETERIZATION     12/24/17  . INTRAVASCULAR ULTRASOUND/IVUS N/A 12/24/2017   Procedure: Intravascular Ultrasound/IVUS;  Surgeon: Adrian Prows, MD;  Location: [facility];  Service: Cardiovascular;  Laterality: N/A;  . LOWER EXTREMITY ANGIOGRAPHY Bilateral 11/22/2018   Procedure: LOWER EXTREMITY ANGIOGRAPHY;  Surgeon: Adrian Prows, MD;  Location: [place] [facility];  Service: Cardiovascular;  Laterality: Bilateral;  . lymph node removal right arm    . PERIPHERAL VASCULAR CATHETERIZATION N/A 11/10/2016   Procedure: Lower Extremity Angiography;  Surgeon: Adrian Prows, MD;  Location: [place];  Service: Cardiovascular;  Laterality: N/A;  . PERIPHERAL VASCULAR CATHETERIZATION Right 11/10/2016   Procedure: Peripheral Vascular Intervention;  Surgeon: Adrian Prows, MD;  Location: [facility];  Service: Cardiovascular;  Laterality: Right;  Rt Com Iliac  . TEE WITHOUT CARDIOVERSION N/A 02/15/2018   Procedure: TRANSESOPHAGEAL ECHOCARDIOGRAM (TEE);  Surgeon: Sherren Mocha, MD;  Location: [facility] [place];  Service: Open Heart Surgery;  Laterality: N/A;  . TONSILLECTOMY    . TRANSCATHETER AORTIC VALVE REPLACEMENT, TRANSFEMORAL N/A 02/15/2018   Procedure: TRANSCATHETER AORTIC VALVE REPLACEMENT, TRANSFEMORAL;  Surgeon: Sherren Mocha, MD;  Location: [place];  Service: Open Heart Surgery;  Laterality: N/A;    FAMILY HISTORY Family History  Problem Relation Age of Onset  . Heart attack Mother   . Heart attack Father   . Breast cancer Neg Hx   The patient's family were [employer].  Her father died at the age of 53 from a heart attack.  Her mother died at age 64, and she had  a breast cancer diagnosed at age 63.  She had a mastectomy and Tamoxifen at that time.    GYNECOLOGIC HISTORY: She is G3, F3, P0, A0, L3.  Menopause age 71.  She never took hormones.   SOCIAL HISTORY: (Updated 07/30/2020. 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  Her husband died in the year 1999-01-30.  He had lung cancer and was taken care of by Dr. Ralene Ok.  Her children are Heidi (who lives in [place] and sells medications to labs); Shanon Brow (who lives in [place] and is in [employer]); and Medical sales representative (who lives in [place] and is currently a homemaker.).  The patient has three grandsons: Barnabas Lister, 22, working for [employer],; Case, 21 a senior in college; and Hart Carwin; 97.  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     ADVANCED DIRECTIVES: In place   HEALTH MAINTENANCE: Social History   Tobacco Use  . Smoking status: Former Smoker    Years: 15.00    Types: Cigarettes    Quit date: 06/04/1968    Years since quitting: 52.1  . Smokeless tobacco: Never Used  Vaping Use  . Vaping Use: Never used  Substance Use Topics  . Alcohol use: Yes    Comment: occasional  . Drug use: No     Colonoscopy: 11/01/2015/ [facility]  PAP:  Bone density: Jan 29, 2013  Lipid panel:  Allergies  Allergen Reactions  . Fish Allergy Hives  . Iodine Hives    Allergic to ALL seafood, shrimp, crab etc.  . Shellfish Allergy Hives  . Olmesartan Other (See Comments)    Hyperkalemia  . Tape Hives  . Zetia [Ezetimibe] Diarrhea    Current Outpatient Medications  Medication Sig Dispense Refill  . amLODipine (NORVASC) 5 MG tablet Take 5 mg by mouth daily.    Marland Kitchen aspirin 81 MG tablet Take 81 mg by mouth at bedtime.     Marland Kitchen atorvastatin (LIPITOR) 10 MG tablet TAKE 1 TABLET BY MOUTH  DAILY 90 tablet 3  . colestipol (COLESTID) 1 g tablet Take 1 g by mouth 3 (three) times daily before meals.     . hydrochlorothiazide (MICROZIDE) 12.5 MG capsule Take 1 capsule (12.5 mg total) by mouth daily. 90 capsule 6  . linagliptin (TRADJENTA) 5 MG TABS tablet Take 5 mg by mouth daily.    Marland Kitchen REPATHA SURECLICK 099 MG/ML SOAJ INJECT $RemoveBef'140MG'[id number]$  SUBCUTANEOUSLY EVERY 2 WEEKS 6 mL 3   No current facility-administered medications for this visit.     OBJECTIVE: White woman who appears well  Vitals:   07/30/20 0948  BP: (!) 145/78  Pulse: 83  Resp: 18  Temp: 97.6 F (36.4 C)  SpO2: 100%     Body mass index is 28.5 kg/m.      Sclerae unicteric, EOMs intact Wearing a mask No cervical or supraclavicular adenopathy Lungs no rales or rhonchi Heart regular rate and rhythm Abd soft, nontender, positive bowel sounds MSK no focal spinal tenderness, no upper extremity lymphedema Neuro: nonfocal, well oriented, appropriate affect Breasts: The right breast is status post lumpectomy.  There is no evidence of local recurrence.  Left breast is benign.  Both axillae are benign.   LAB RESULTS: Lab Results  Component Value Date   WBC 4.5 07/30/2020   NEUTROABS 2.4 07/30/2020   HGB 12.4 07/30/2020   HCT 37.9 07/30/2020   MCV 97.2 07/30/2020   PLT 161 07/30/2020  Chemistry      Component Value Date/Time   NA 140 07/30/2020 0848   NA 143 12/08/2019 0907   NA 141 06/24/2017 1333   K 4.1 07/30/2020 0848   K 4.8 06/24/2017 1333   CL 103 07/30/2020 0848   CO2 30 07/30/2020 0848   CO2 26 06/24/2017 1333   BUN 26 (H) 07/30/2020 0848   BUN 25 12/08/2019 0907   BUN 25.6 06/24/2017 1333   CREATININE 1.08 (H) 07/30/2020 0848   CREATININE 1.3 (H) 06/24/2017 1333      Component Value Date/Time   CALCIUM 9.3 07/30/2020 0848   CALCIUM 9.1 06/24/2017 1333   ALKPHOS 41 07/30/2020 0848   ALKPHOS 50 06/24/2017 1333   AST 15 07/30/2020 0848   AST 15 06/24/2017 1333   ALT <6 07/30/2020 0848   ALT 7 06/24/2017 1333   BILITOT 0.5 07/30/2020 0848   BILITOT 0.4 12/08/2019 0907   BILITOT 0.43 06/24/2017 1333       Lab Results  Component Value Date   LABCA2 13 05/17/2008    No components found for: [id number]  No results for input(s): INR in the last 168 hours.  Urinalysis    Component Value Date/Time   COLORURINE STRAW (A) 02/11/2018 1208    STUDIES: No results found.   ASSESSMENT: 86 y.o. [place] woman status  post right lumpectomy and sentinel lymph node biopsy June 2004 for a 1.2 cm estrogen and progesterone receptor positive, HER2/neu negative, grade 2 invasive ductal carcinoma, lymph node negative,   (1) on anastrozole between July 2004 and July 2009 through the MA27 study, note that the study still is unblinded so we do not know if she took 5 or 10 years of aromatase inhibitors  (2) enrolled in the B42 study as of July 2009 and received either placebo or letrozole.   (3) normal bone density 09/13/2013 at [facility].  (4) CKD stage II-III   PLAN:  Mallory Thomas is now 17 years out from definitive surgery for breast cancer with no evidence of disease recurrence.  This is very favorable.  She is behind on mammography and we have ordered that to be done sometime later this month.  For her vaginal problems coconut oil has been suggested.  That is frequently used by my patients.  If that does not work I would not be adverse for her to receive local estrogen vaginally for 2 or 3 months until there is significant improvement after which she can go back to emollients  She will see me again in a year.  She knows to call for any other issue that may develop before then  Total encounter time 20 minutes.*     Cassidy Tabet, Virgie Dad, MD  07/30/20 10:14 AM Medical Oncology and Hematology [facility] [place], [place] [address] Tel. [phone]    Fax. [phone]   I, Wilburn Mylar, am acting as scribe for Dr. Virgie Dad. Sergio Zawislak.  I, Lurline Del MD, have reviewed the above documentation for accuracy and completeness, and I agree with the above.    *Total Encounter Time as defined by the Centers for Medicare and Medicaid Services includes, in addition to the face-to-face time of a patient visit (documented in the note above) non-face-to-face time: obtaining and reviewing outside history, ordering and reviewing medications, tests or procedures,  care coordination (communications with other health care professionals or caregivers) and documentation in the medical record.

## 2020-07-30 ENCOUNTER — Inpatient Hospital Stay: Payer: Medicare Other

## 2020-07-30 ENCOUNTER — Inpatient Hospital Stay: Payer: Medicare Other | Attending: Oncology | Admitting: Oncology

## 2020-07-30 ENCOUNTER — Other Ambulatory Visit: Payer: Self-pay

## 2020-07-30 VITALS — BP 145/78 | HR 83 | Temp 97.6°F | Resp 18 | Ht 62.0 in | Wt 155.8 lb

## 2020-07-30 DIAGNOSIS — C50811 Malignant neoplasm of overlapping sites of right female breast: Secondary | ICD-10-CM

## 2020-07-30 DIAGNOSIS — E1122 Type 2 diabetes mellitus with diabetic chronic kidney disease: Secondary | ICD-10-CM | POA: Diagnosis not present

## 2020-07-30 DIAGNOSIS — Z853 Personal history of malignant neoplasm of breast: Secondary | ICD-10-CM | POA: Diagnosis not present

## 2020-07-30 DIAGNOSIS — Z170 Estrogen receptor positive status [ER+]: Secondary | ICD-10-CM

## 2020-07-30 DIAGNOSIS — Z952 Presence of prosthetic heart valve: Secondary | ICD-10-CM | POA: Diagnosis not present

## 2020-07-30 DIAGNOSIS — Z7984 Long term (current) use of oral hypoglycemic drugs: Secondary | ICD-10-CM | POA: Insufficient documentation

## 2020-07-30 DIAGNOSIS — Z87891 Personal history of nicotine dependence: Secondary | ICD-10-CM | POA: Insufficient documentation

## 2020-07-30 DIAGNOSIS — Z08 Encounter for follow-up examination after completed treatment for malignant neoplasm: Secondary | ICD-10-CM | POA: Insufficient documentation

## 2020-07-30 DIAGNOSIS — E785 Hyperlipidemia, unspecified: Secondary | ICD-10-CM | POA: Diagnosis not present

## 2020-07-30 DIAGNOSIS — I129 Hypertensive chronic kidney disease with stage 1 through stage 4 chronic kidney disease, or unspecified chronic kidney disease: Secondary | ICD-10-CM | POA: Insufficient documentation

## 2020-07-30 DIAGNOSIS — N182 Chronic kidney disease, stage 2 (mild): Secondary | ICD-10-CM | POA: Diagnosis not present

## 2020-07-30 DIAGNOSIS — Z79899 Other long term (current) drug therapy: Secondary | ICD-10-CM | POA: Insufficient documentation

## 2020-07-30 DIAGNOSIS — Z7982 Long term (current) use of aspirin: Secondary | ICD-10-CM | POA: Diagnosis not present

## 2020-07-30 DIAGNOSIS — I350 Nonrheumatic aortic (valve) stenosis: Secondary | ICD-10-CM | POA: Insufficient documentation

## 2020-07-30 DIAGNOSIS — Z923 Personal history of irradiation: Secondary | ICD-10-CM | POA: Insufficient documentation

## 2020-07-30 LAB — CBC WITH DIFFERENTIAL/PLATELET
Abs Immature Granulocytes: 0.01 10*3/uL (ref 0.00–0.07)
Basophils Absolute: 0 10*3/uL (ref 0.0–0.1)
Basophils Relative: 1 %
Eosinophils Absolute: 0.2 10*3/uL (ref 0.0–0.5)
Eosinophils Relative: 4 %
HCT: 37.9 % (ref 36.0–46.0)
Hemoglobin: 12.4 g/dL (ref 12.0–15.0)
Immature Granulocytes: 0 %
Lymphocytes Relative: 32 %
Lymphs Abs: 1.4 10*3/uL (ref 0.7–4.0)
MCH: 31.8 pg (ref 26.0–34.0)
MCHC: 32.7 g/dL (ref 30.0–36.0)
MCV: 97.2 fL (ref 80.0–100.0)
Monocytes Absolute: 0.4 10*3/uL (ref 0.1–1.0)
Monocytes Relative: 10 %
Neutro Abs: 2.4 10*3/uL (ref 1.7–7.7)
Neutrophils Relative %: 53 %
Platelets: 161 10*3/uL (ref 150–400)
RBC: 3.9 MIL/uL (ref 3.87–5.11)
RDW: 13.6 % (ref 11.5–15.5)
WBC: 4.5 10*3/uL (ref 4.0–10.5)
nRBC: 0 % (ref 0.0–0.2)

## 2020-07-30 LAB — COMPREHENSIVE METABOLIC PANEL
ALT: <6 U/L (ref 0–44)
AST: 15 U/L (ref 15–41)
Albumin: 3.9 g/dL (ref 3.5–5.0)
Alkaline Phosphatase: 41 U/L (ref 38–126)
Anion gap: 7 (ref 5–15)
BUN: 26 mg/dL — ABNORMAL HIGH (ref 8–23)
CO2: 30 mmol/L (ref 22–32)
Calcium: 9.3 mg/dL (ref 8.9–10.3)
Chloride: 103 mmol/L (ref 98–111)
Creatinine, Ser: 1.08 mg/dL — ABNORMAL HIGH (ref 0.44–1.00)
GFR calc Af Amer: 54 mL/min — ABNORMAL LOW (ref >60)
GFR calc non Af Amer: 47 mL/min — ABNORMAL LOW (ref >60)
Glucose, Bld: 143 mg/dL — ABNORMAL HIGH (ref 70–99)
Potassium: 4.1 mmol/L (ref 3.5–5.1)
Sodium: 140 mmol/L (ref 135–145)
Total Bilirubin: 0.5 mg/dL (ref 0.3–1.2)
Total Protein: 6.8 g/dL (ref 6.5–8.1)

## 2020-08-14 ENCOUNTER — Ambulatory Visit
Admission: RE | Admit: 2020-08-14 | Discharge: 2020-08-14 | Disposition: A | Discharge status: Home / Self Care | Payer: Medicare Other | Source: Ambulatory Visit | Attending: Oncology | Admitting: Oncology

## 2020-08-14 ENCOUNTER — Other Ambulatory Visit: Payer: Self-pay

## 2020-08-14 DIAGNOSIS — Z170 Estrogen receptor positive status [ER+]: Secondary | ICD-10-CM

## 2020-08-14 DIAGNOSIS — C50811 Malignant neoplasm of overlapping sites of right female breast: Secondary | ICD-10-CM

## 2020-08-14 DIAGNOSIS — Z1231 Encounter for screening mammogram for malignant neoplasm of breast: Secondary | ICD-10-CM | POA: Diagnosis not present

## 2020-11-04 DIAGNOSIS — H524 Presbyopia: Secondary | ICD-10-CM | POA: Diagnosis not present

## 2020-11-04 DIAGNOSIS — H5203 Hypermetropia, bilateral: Secondary | ICD-10-CM | POA: Diagnosis not present

## 2020-11-04 DIAGNOSIS — Z961 Presence of intraocular lens: Secondary | ICD-10-CM | POA: Diagnosis not present

## 2020-11-04 DIAGNOSIS — H52203 Unspecified astigmatism, bilateral: Secondary | ICD-10-CM | POA: Diagnosis not present

## 2020-11-04 DIAGNOSIS — E119 Type 2 diabetes mellitus without complications: Secondary | ICD-10-CM | POA: Diagnosis not present

## 2020-12-05 ENCOUNTER — Ambulatory Visit: Payer: Medicare Other | Admitting: Cardiology

## 2020-12-05 ENCOUNTER — Encounter: Payer: Self-pay | Admitting: Cardiology

## 2020-12-05 ENCOUNTER — Other Ambulatory Visit: Payer: Self-pay

## 2020-12-05 VITALS — BP 170/80 | HR 101 | Temp 97.6°F | Resp 16 | Ht 62.0 in | Wt 155.0 lb

## 2020-12-05 DIAGNOSIS — Z952 Presence of prosthetic heart valve: Secondary | ICD-10-CM

## 2020-12-05 DIAGNOSIS — I10 Essential (primary) hypertension: Secondary | ICD-10-CM

## 2020-12-05 DIAGNOSIS — I2510 Atherosclerotic heart disease of native coronary artery without angina pectoris: Secondary | ICD-10-CM

## 2020-12-05 MED ORDER — AMLODIPINE BESYLATE 10 MG PO TABS: 10.0000 mg | ORAL_TABLET | Freq: Every day | ORAL | 3 refills | Status: DC

## 2020-12-05 NOTE — Progress Notes (Signed)
Primary Physician/Referring:  Jani Gravel, MD  Patient ID: Mallory Thomas, female    DOB: 06/19/1932, 87 y.o.   MRN: [id number]  Chief Complaint  Patient presents with  . Aortic Stenosis  . Hyperlipidemia  . Hypertension  . Coronary Artery Disease  . Follow-up    1 year   HPI:    Mallory Thomas  is a 87 y.o. Caucasian female  with history of breast cancer status post radiation therapy he and lumpectomy on the right breast in 2000 hypertension, diabetes mellitus, hyperlipidemia, peripheral arterial disease with right iliac artery stenting in 2017, patent by angiography on 11/29/2018, coronary artery disease with ostial RCA 70-80% stenosis by angiography on 12/24/2017, TAVR for critical AS on 02/15/2018 and presents here for follow-up.  Patient had severe myalgias and once progression of PAD was excluded, high-dose atorvastatin was discontinued and she was placed on 10 mg of atorvastatin.  She was started on Repatha but has discontinued this on her own.  Otherwise remains asymptomatic and continues to exercise regularly. No chest pain or dyspnea.   Past Medical History:  Diagnosis Date  . Aortic stenosis   . Breast cancer ([place]) 2004   right  . Diabetes mellitus   . Heart murmur   . Hyperlipidemia   . Hypertension   . S/P TAVR (transcatheter aortic valve replacement) 02/15/2018   23 mm Edwards Sapien 3 transcatheter heart valve placed via percutaneous right transfemoral approach    Past Surgical History:  Procedure Laterality Date  . ABDOMINAL AORTOGRAM N/A 11/22/2018   Procedure: ABDOMINAL AORTOGRAM;  Surgeon: Adrian Prows, MD;  Location: [facility];  Service: Cardiovascular;  Laterality: N/A;  . BILATERAL KNEE ARTHROSCOPY    . BREAST LUMPECTOMY Right 2004   radiation   . CARDIAC CATHETERIZATION     12/24/17  . INTRAVASCULAR ULTRASOUND/IVUS N/A 12/24/2017   Procedure: Intravascular Ultrasound/IVUS;  Surgeon: Adrian Prows, MD;  Location: [facility];  Service:  Cardiovascular;  Laterality: N/A;  . LOWER EXTREMITY ANGIOGRAPHY Bilateral 11/22/2018   Procedure: LOWER EXTREMITY ANGIOGRAPHY;  Surgeon: Adrian Prows, MD;  Location: [facility];  Service: Cardiovascular;  Laterality: Bilateral;  . lymph node removal right arm    . PERIPHERAL VASCULAR CATHETERIZATION N/A 11/10/2016   Procedure: Lower Extremity Angiography;  Surgeon: Adrian Prows, MD;  Location: [facility];  Service: Cardiovascular;  Laterality: N/A;  . PERIPHERAL VASCULAR CATHETERIZATION Right 11/10/2016   Procedure: Peripheral Vascular Intervention;  Surgeon: Adrian Prows, MD;  Location: [facility];  Service: Cardiovascular;  Laterality: Right;  Rt Com Iliac  . TEE WITHOUT CARDIOVERSION N/A 02/15/2018   Procedure: TRANSESOPHAGEAL ECHOCARDIOGRAM (TEE);  Surgeon: Sherren Mocha, MD;  Location: [facility];  Service: Open Heart Surgery;  Laterality: N/A;  . TONSILLECTOMY    . TRANSCATHETER AORTIC VALVE REPLACEMENT, TRANSFEMORAL N/A 02/15/2018   Procedure: TRANSCATHETER AORTIC VALVE REPLACEMENT, TRANSFEMORAL;  Surgeon: Sherren Mocha, MD;  Location: [facility];  Service: Open Heart Surgery;  Laterality: N/A;   Social History   Tobacco Use  . Smoking status: Former Smoker    Packs/day: 1.00    Years: 15.00    Pack years: 15.00    Types: Cigarettes    Quit date: 06/04/1968    Years since quitting: 52.5  . Smokeless tobacco: Never Used  Substance Use Topics  . Alcohol use: Yes    Comment: occasional   ROS  Review of Systems  Cardiovascular: Negative for chest pain, dyspnea on exertion and leg  swelling.  Gastrointestinal: Negative for melena.   Objective  Blood pressure (!) 170/80, pulse (!) 101, temperature 97.6 F (36.4 C), resp. rate 16, height $RemoveBe'5\' 2"'KXbuVNgmt$  (1.575 m), weight 155 lb (70.3 kg), SpO2 99 %.  Vitals with BMI 12/05/2020 07/30/2020 12/06/2019  Height $Remov'5\' 2"'hMkymg$  $Remove'5\' 2"'JqylpFu$  $RemoveB'5\' 2"'vSTQSkVm$   Weight 155 lbs 155 lbs 13 oz 158 lbs  BMI 28.34 41.66 06.30  Systolic 160 109 323  Diastolic 80 78 81  Pulse  101 83 85     Physical Exam Cardiovascular:     Rate and Rhythm: Normal rate and regular rhythm.     Pulses: Intact distal pulses.     Heart sounds: Normal heart sounds. No murmur heard. No gallop.      Comments: No leg edema, no JVD. Pulmonary:     Effort: Pulmonary effort is normal.     Breath sounds: Normal breath sounds.  Abdominal:     General: Bowel sounds are normal.     Palpations: Abdomen is soft.    Laboratory examination:   Recent Labs    12/08/19 0907 07/30/20 0848  NA 143 140  K 5.2 4.1  CL 105 103  CO2 24 30  GLUCOSE 123* 143*  BUN 25 26*  CREATININE 1.11* 1.08*  CALCIUM 8.6* 9.3  GFRNONAA 45* 47*  GFRAA 52* 54*   CrCl cannot be calculated (Patient's most recent lab result is older than the maximum 21 days allowed.).  CMP Latest Ref Rng & Units 07/30/2020 12/08/2019 06/23/2019  Glucose 70 - 99 mg/dL 143(H) 123(H) 136(H)  BUN 8 - 23 mg/dL 26(H) 25 25  Creatinine 0.44 - 1.00 mg/dL 1.08(H) 1.11(H) 1.10(H)  Sodium 135 - 145 mmol/L 140 143 142  Potassium 3.5 - 5.1 mmol/L 4.1 5.2 4.5  Chloride 98 - 111 mmol/L 103 105 106  CO2 22 - 32 mmol/L 30 24 19(L)  Calcium 8.9 - 10.3 mg/dL 9.3 8.6(L) 9.1  Total Protein 6.5 - 8.1 g/dL 6.8 6.9 -  Total Bilirubin 0.3 - 1.2 mg/dL 0.5 0.4 -  Alkaline Phos 38 - 126 U/L 41 55 -  AST 15 - 41 U/L 15 16 -  ALT 0 - 44 U/L <6 6 -   CBC Latest Ref Rng & Units 07/30/2020 06/30/2018 03/03/2018  WBC 4.0 - 10.5 K/uL 4.5 3.8(L) 5.5  Hemoglobin 12.0 - 15.0 g/dL 12.4 11.3(L) 11.3  Hematocrit 36.0 - 46.0 % 37.9 34.9 33.6(L)  Platelets 150 - 400 K/uL 161 128(L) 209   Lipid Panel     Component Value Date/Time   CHOL 219 (H) 12/08/2019 0907   TRIG 63 12/08/2019 0907   HDL 119 12/08/2019 0907   CHOLHDL 3.3 05/14/2009 0915   VLDL 13 05/14/2009 0915   LDLCALC 89 12/08/2019 0907   HEMOGLOBIN A1C Lab Results  Component Value Date   HGBA1C 6.7 (H) 02/11/2018   MPG 145.59 02/11/2018   A1C 7.200 % 10/25/2019  TSH No results for  input(s): TSH in the last 8760 hours. TSH 3.850 11/14/2018  11/15/2018: RBC 3.61, normal H&H, platelets minimally decreased at 147, CBC otherwise normal. Glucose 112, creatinine 1.13, EGFR 45/52, potassium 5.3, CMP otherwise normal. TSH normal at 3.8.   Cholesterol 176, triglycerides 74, HDL 76, LDL 85. Hemoglobin A1c 6.2%.  Medications and allergies   Allergies  Allergen Reactions  . Fish Allergy Hives  . Iodine Hives    Allergic to ALL seafood, shrimp, crab etc.  . Olmesartan Other (See Comments)    Hyperkalemia  .  Shellfish Allergy Hives  . Tape Hives  . Zetia [Ezetimibe] Diarrhea    Current Outpatient Medications on File Prior to Visit  Medication Sig Dispense Refill  . aspirin 81 MG tablet Take 81 mg by mouth at bedtime.    Marland Kitchen atorvastatin (LIPITOR) 10 MG tablet TAKE 1 TABLET BY MOUTH  DAILY 90 tablet 3  . hydrochlorothiazide (MICROZIDE) 12.5 MG capsule Take 1 capsule (12.5 mg total) by mouth daily. 90 capsule 6   No current facility-administered medications on file prior to visit.    Radiology:  No results found.  Cardiac Studies:   Coronary angiogram 12/24/2017:  Severe diffuse coronary calcification. RCA ostial 70-80% and mid 80% by IVUS. Mild disease Cx & LAD. EKG 10/05/2018: Normal sinus rhythm at rate of 67 bpm, normal axis. No evidence of ischemia, normal EKG.  Carotid artery duplex 02/80/2019: Minimal stenosis and bilateral ICA.  Less than 50% stenosis left common carotid artery.  TAVR 02/15/2018: Edwards Sapien 3 THV (size 23 mm, model # 9600TFX, serial # [id number])  Abdominal aortic duplex 10/27/2018: Normal abdominal aorta. No evidence of aneurysm. Peak systolic velocities in the mid and distal aorta are moderately increased to 245.72 cm/s. suggests > 50% stenosis. Peak systolic velocities in the right internal iliac artery are severely increased to 303.74 cm/s. Peak systolic velocities in the left internal iliac artery are moderately increased to 259.37 cm/s.  suggestive of >50% stenosis. Compared to 06/16/2017, previously normal velocity. Consider further work up if clinically indicated  Peripheral arteriogram 11/29/2018: Distal aorta diffuse 30% stenosis. Right renal superior pole 95% stenosis. Right CIA 80% to 0% with 10x30 mm self expanding Absolute Pro placed on 11/10/2018 widely patent. Left mild disease.  Echocardiogram 03/29/2019:  1. The left ventricle has normal systolic function with an ejection fraction of 60-65%. The cavity size was normal. There is mild concentric left ventricular hypertrophy. Left ventricular diastolic Doppler parameters are consistent with impaired  relaxation. Elevated mean left atrial pressure.  2. The right ventricle has normal systolic function. The cavity was normal. There is no increase in right ventricular wall thickness.  3. Left atrial size was moderately dilated.  4. Right atrial size was mildly dilated.  5. Aortic valve regurgitation is trivial by color flow Doppler. Moderate stenosis of the aortic valve.  6. - TAVR: 23 mm Edwards Sapien 3 TAVR bioprosthesis present. Mean gradient 22 mmHg, incrased from 15 mmHg 03/2018. Peak velocity 2.87 m/s.   EKG:    EKG 12/05/2020: Normal sinus rhythm at rate of 89 bpm, left atrial enlargement, normal axis.  No evidence of ischemia, normal EKG. no significant change from 12/06/2019.   Assessment     ICD-10-CM   1. Coronary artery disease involving native coronary artery of native heart without angina pectoris  I25.10 EKG 12-Lead  2. S/P TAVR (transcatheter aortic valve replacement)  Z95.2   3. Essential hypertension  I10 amLODipine (NORVASC) 10 MG tablet    Meds ordered this encounter  Medications  . amLODipine (NORVASC) 10 MG tablet    Sig: Take 1 tablet (10 mg total) by mouth daily.    Dispense:  90 tablet    Refill:  3    Medications Discontinued During This Encounter  Medication Reason  . REPATHA SURECLICK 742 MG/ML SOAJ Error  . linagliptin (TRADJENTA)  5 MG TABS tablet Error  . colestipol (COLESTID) 1 g tablet Error  . amLODipine (NORVASC) 5 MG tablet Reorder    Recommendations:   Mallory Thomas  is a 87  y.o. Caucasian female  with history of breast cancer status post radiation therapy he and lumpectomy on the right breast in 2000 hypertension, diabetes mellitus, hyperlipidemia, peripheral arterial disease with right iliac artery stenting in 2017, patent by angiography on 11/29/2018, coronary artery disease with ostial RCA 70-80% stenosis by angiography on 12/24/2017, TAVR for critical AS on 02/15/2018 and presents here for follow-up.  She is here on a six-month office visit and follow-up, remains asymptomatic and continues to exercise on a daily basis without angina pectoris.  No clinical evidence of heart failure, cardiac examination is otherwise unremarkable and normal.  Her blood pressure is elevated today, will increase amlodipine to 10 mg daily.  She will come back in 6 weeks for follow-up of hypertension.  Previously could not tolerate statins, LDL was not at goal, she was on Repatha which she has discontinued.  She is presently on Lipitor.  Continue the same for now.  If she remains stable, I will see her back in 6 months.   Adrian Prows, MD, [facility] 12/05/2020, 1:38 PM Office: [phone] Pager: [phone]

## 2021-01-05 ENCOUNTER — Other Ambulatory Visit: Payer: Self-pay | Admitting: Cardiology

## 2021-01-15 NOTE — Progress Notes (Signed)
Primary Physician/Referring:  Jani Gravel, MD  Patient ID: Mallory Thomas, female    DOB: 1932-06-08, 87 y.o.   MRN: [id number]  Chief Complaint  Patient presents with  . Hypertension  . Follow-up   HPI:    Mallory Thomas  is a 87 y.o. Caucasian female  with history of breast cancer status post radiation therapy he and lumpectomy on the right breast in 2000 hypertension, diabetes mellitus, hyperlipidemia, peripheral arterial disease with right iliac artery stenting in 2017, patent by angiography on 11/29/2018, coronary artery disease with ostial RCA 70-80% stenosis by angiography on 12/24/2017, TAVR for critical AS on 02/15/2018 and presents here for follow-up.  Patient had severe myalgias and once progression of PAD was excluded, high-dose atorvastatin was discontinued and she was placed on 10 mg of atorvastatin.  She was started on Repatha but has discontinued this on her own.   Patient presents for 6-week follow-up of hypertension.  At last visit increased amlodipine to 10 mg daily.  Patient is tolerating increased dose of amlodipine without issue.  Denies chest pain, dyspnea, syncope, near syncope, dizziness.  Denies headaches, palpitations, leg swelling, orthopnea, PND.  Although her blood pressure is elevated in the office today, she brings with her written log of home blood pressure readings which are under excellent control.  Past Medical History:  Diagnosis Date  . Aortic stenosis   . Breast cancer ([place]) 2004   right  . Diabetes mellitus   . Heart murmur   . Hyperlipidemia   . Hypertension   . S/P TAVR (transcatheter aortic valve replacement) 02/15/2018   23 mm Edwards Sapien 3 transcatheter heart valve placed via percutaneous right transfemoral approach    Past Surgical History:  Procedure Laterality Date  . ABDOMINAL AORTOGRAM N/A 11/22/2018   Procedure: ABDOMINAL AORTOGRAM;  Surgeon: Adrian Prows, MD;  Location: [facility];  Service: Cardiovascular;   Laterality: N/A;  . BILATERAL KNEE ARTHROSCOPY    . BREAST LUMPECTOMY Right 2004   radiation   . CARDIAC CATHETERIZATION     12/24/17  . INTRAVASCULAR ULTRASOUND/IVUS N/A 12/24/2017   Procedure: Intravascular Ultrasound/IVUS;  Surgeon: Adrian Prows, MD;  Location: [facility];  Service: Cardiovascular;  Laterality: N/A;  . LOWER EXTREMITY ANGIOGRAPHY Bilateral 11/22/2018   Procedure: LOWER EXTREMITY ANGIOGRAPHY;  Surgeon: Adrian Prows, MD;  Location: [facility];  Service: Cardiovascular;  Laterality: Bilateral;  . lymph node removal right arm    . PERIPHERAL VASCULAR CATHETERIZATION N/A 11/10/2016   Procedure: Lower Extremity Angiography;  Surgeon: Adrian Prows, MD;  Location: [facility];  Service: Cardiovascular;  Laterality: N/A;  . PERIPHERAL VASCULAR CATHETERIZATION Right 11/10/2016   Procedure: Peripheral Vascular Intervention;  Surgeon: Adrian Prows, MD;  Location: [facility];  Service: Cardiovascular;  Laterality: Right;  Rt Com Iliac  . TEE WITHOUT CARDIOVERSION N/A 02/15/2018   Procedure: TRANSESOPHAGEAL ECHOCARDIOGRAM (TEE);  Surgeon: Sherren Mocha, MD;  Location: [facility];  Service: Open Heart Surgery;  Laterality: N/A;  . TONSILLECTOMY    . TRANSCATHETER AORTIC VALVE REPLACEMENT, TRANSFEMORAL N/A 02/15/2018   Procedure: TRANSCATHETER AORTIC VALVE REPLACEMENT, TRANSFEMORAL;  Surgeon: Sherren Mocha, MD;  Location: [facility];  Service: Open Heart Surgery;  Laterality: N/A;   Social History   Tobacco Use  . Smoking status: Former Smoker    Packs/day: 1.00    Years: 15.00    Pack years: 15.00    Types: Cigarettes    Quit date: 06/04/1968    Years  since quitting: 52.6  . Smokeless tobacco: Never Used  Substance Use Topics  . Alcohol use: Yes    Comment: occasional   ROS  Review of Systems  Constitutional: Negative for malaise/fatigue and weight gain.  Cardiovascular: Negative for chest pain, claudication, dyspnea on exertion, leg swelling, near-syncope, orthopnea,  palpitations, paroxysmal nocturnal dyspnea and syncope.  Respiratory: Negative for shortness of breath.   Hematologic/Lymphatic: Does not bruise/bleed easily.  Gastrointestinal: Negative for melena.  Neurological: Negative for dizziness and weakness.   Objective  Blood pressure (!) 148/72, pulse 89, temperature 97.7 F (36.5 C), height _0  (1.575 m), weight 155 lb (70.3 kg), SpO2 99 %.  Vitals with BMI 01/16/2021 12/05/2020 07/30/2020  Height _1  _2  _3   Weight 155 lbs 155 lbs 155 lbs 13 oz  BMI 28.34 34.19 37.90  Systolic 240 973 532  Diastolic 72 80 78  Pulse 89 101 83     Physical Exam Cardiovascular:     Rate and Rhythm: Normal rate and regular rhythm.     Pulses: Intact distal pulses.     Heart sounds: Normal heart sounds. No murmur heard. No gallop.      Comments: No leg edema, no JVD. Pulmonary:     Effort: Pulmonary effort is normal.     Breath sounds: Normal breath sounds.  Musculoskeletal:     Right lower leg: No edema.     Left lower leg: No edema.  Skin:    General: Skin is warm and dry.    Laboratory examination:   Recent Labs    07/30/20 0848  NA 140  K 4.1  CL 103  CO2 30  GLUCOSE 143*  BUN 26*  CREATININE 1.08*  CALCIUM 9.3  GFRNONAA 47*  GFRAA 54*   CrCl cannot be calculated (Patient's most recent lab result is older than the maximum 21 days allowed.).  CMP Latest Ref Rng & Units 07/30/2020 12/08/2019 06/23/2019  Glucose 70 - 99 mg/dL 143(H) 123(H) 136(H)  BUN 8 - 23 mg/dL 26(H) 25 25  Creatinine 0.44 - 1.00 mg/dL 1.08(H) 1.11(H) 1.10(H)  Sodium 135 - 145 mmol/L 140 143 142  Potassium 3.5 - 5.1 mmol/L 4.1 5.2 4.5  Chloride 98 - 111 mmol/L 103 105 106  CO2 22 - 32 mmol/L 30 24 19(L)  Calcium 8.9 - 10.3 mg/dL 9.3 8.6(L) 9.1  Total Protein 6.5 - 8.1 g/dL 6.8 6.9 -  Total Bilirubin 0.3 - 1.2 mg/dL 0.5 0.4 -  Alkaline Phos 38 - 126 U/L 41 55 -  AST 15 - 41 U/L 15 16 -  ALT 0 - 44 U/L <6 6 -   CBC Latest Ref Rng & Units 07/30/2020 06/30/2018  03/03/2018  WBC 4.0 - 10.5 K/uL 4.5 3.8(L) 5.5  Hemoglobin 12.0 - 15.0 g/dL 12.4 11.3(L) 11.3  Hematocrit 36.0 - 46.0 % 37.9 34.9 33.6(L)  Platelets 150 - 400 K/uL 161 128(L) 209   Lipid Panel     Component Value Date/Time   CHOL 219 (H) 12/08/2019 0907   TRIG 63 12/08/2019 0907   HDL 119 12/08/2019 0907   CHOLHDL 3.3 05/14/2009 0915   VLDL 13 05/14/2009 0915   LDLCALC 89 12/08/2019 0907   HEMOGLOBIN A1C Lab Results  Component Value Date   HGBA1C 6.7 (H) 02/11/2018   MPG 145.59 02/11/2018   A1C 7.200 % 10/25/2019  TSH No results for input(s): TSH in the last 8760 hours. TSH 3.850 11/14/2018  11/15/2018: RBC 3.61, normal H&H, platelets minimally decreased  at 147, CBC otherwise normal. Glucose 112, creatinine 1.13, EGFR 45/52, potassium 5.3 , CMP otherwise normal. TSH normal at 3.8.   Cholesterol 176, triglycerides 74, HDL 76, LDL 85. Hemoglobin A1c 6.2%.  Medications and allergies   Allergies  Allergen Reactions  . Fish Allergy Hives  . Iodine Hives    Allergic to ALL seafood, shrimp, crab etc.  . Olmesartan Other (See Comments)    Hyperkalemia  . Shellfish Allergy Hives  . Tape Hives  . Zetia [Ezetimibe] Diarrhea    Current Outpatient Medications on File Prior to Visit  Medication Sig Dispense Refill  . amLODipine (NORVASC) 10 MG tablet Take 1 tablet (10 mg total) by mouth daily. 90 tablet 3  . aspirin 81 MG tablet Take 81 mg by mouth at bedtime.    Marland Kitchen atorvastatin (LIPITOR) 10 MG tablet TAKE 1 TABLET BY MOUTH  DAILY 90 tablet 3  . hydrochlorothiazide (MICROZIDE) 12.5 MG capsule Take 1 capsule (12.5 mg total) by mouth daily. 90 capsule 6  . OZEMPIC, 0.25 OR 0.5 MG/DOSE, 2 MG/1.5ML SOPN Inject 0.5 mLs into the skin daily.     No current facility-administered medications on file prior to visit.    Radiology:  No results found.  Cardiac Studies:   Coronary angiogram 12/24/2017:  Severe diffuse coronary calcification. RCA ostial 70-80% and mid 80% by IVUS. Mild  disease Cx & LAD. EKG 10/05/2018: Normal sinus rhythm at rate of 67 bpm, normal axis. No evidence of ischemia, normal EKG.  Carotid artery duplex 02/80/2019: Minimal stenosis and bilateral ICA.  Less than 50% stenosis left common carotid artery.  TAVR 02/15/2018: Edwards Sapien 3 THV (size 23 mm, model # 9600TFX, serial # [id number])  Abdominal aortic duplex 10/27/2018: Normal abdominal aorta. No evidence of aneurysm. Peak systolic velocities in the mid and distal aorta are moderately increased to 245.72 cm/s. suggests > 50% stenosis. Peak systolic velocities in the right internal iliac artery are severely increased to 303.74 cm/s. Peak systolic velocities in the left internal iliac artery are moderately increased to 259.37 cm/s. suggestive of >50% stenosis. Compared to 06/16/2017, previously normal velocity. Consider further work up if clinically indicated  Peripheral arteriogram 11/29/2018: Distal aorta diffuse 30% stenosis. Right renal superior pole 95% stenosis. Right CIA 80% to 0% with 10x30 mm self expanding Absolute Pro placed on 11/10/2018 widely patent. Left mild disease.  Echocardiogram 03/29/2019:  1. The left ventricle has normal systolic function with an ejection fraction of 60-65%. The cavity size was normal. There is mild concentric left ventricular hypertrophy. Left ventricular diastolic Doppler parameters are consistent with impaired  relaxation. Elevated mean left atrial pressure.  2. The right ventricle has normal systolic function. The cavity was normal. There is no increase in right ventricular wall thickness.  3. Left atrial size was moderately dilated.  4. Right atrial size was mildly dilated.  5. Aortic valve regurgitation is trivial by color flow Doppler. Moderate stenosis of the aortic valve.  6. - TAVR: 23 mm Edwards Sapien 3 TAVR bioprosthesis present. Mean gradient 22 mmHg, incrased from 15 mmHg 03/2018. Peak velocity 2.87 m/s.    EKG   EKG 12/05/2020: Normal sinus  rhythm at rate of 89 bpm, left atrial enlargement, normal axis.  No evidence of ischemia, normal EKG. no significant change from 12/06/2019.  Assessment     ICD-10-CM   1. Essential hypertension  I10   2. PAD (peripheral artery disease) (HCC)  I73.9   3. Coronary artery disease involving native coronary artery of native  heart without angina pectoris  I25.10   4. Hypercholesteremia  E78.00   5. S/P TAVR (transcatheter aortic valve replacement)  Z95.2     No orders of the defined types were placed in this encounter.   There are no discontinued medications.  Recommendations:   Mallory Thomas  is a 87 y.o. Caucasian female  with history of breast cancer status post radiation therapy he and lumpectomy on the right breast in 2000 hypertension, diabetes mellitus, hyperlipidemia, peripheral arterial disease with right iliac artery stenting in 2017, patent by angiography on 11/29/2018, coronary artery disease with ostial RCA 70-80% stenosis by angiography on 12/24/2017, TAVR for critical AS on 02/15/2018 and presents here for follow-up.  Patient presents for 6-week follow-up of hypertension, at last visit increased amlodipine to 10 mg daily.  Patient is tolerating present medications without issue.  Although her blood pressure is elevated in the office today, she brings with her written log of home blood pressure readings which are under excellent control.  Will not make changes to patient's cardiovascular medications at this time.  Patient remains asymptomatic and continues to exercise on a daily basis.  There are no clinical signs of heart failure.  She continues to tolerate Lipitor without issue.  Follow-up in 6 months, sooner if needed, for hypertension, hyperlipidemia, PAD, and CAD, as well as status post TAVR.   Alethia Berthold, PA-C 01/16/2021, 2:02 PM Office: [phone]

## 2021-01-16 ENCOUNTER — Ambulatory Visit: Payer: Medicare Other | Admitting: Student

## 2021-01-16 ENCOUNTER — Other Ambulatory Visit: Payer: Self-pay

## 2021-01-16 ENCOUNTER — Encounter: Payer: Self-pay | Admitting: Student

## 2021-01-16 VITALS — BP 148/72 | HR 89 | Temp 97.7°F | Ht 62.0 in | Wt 155.0 lb

## 2021-01-16 DIAGNOSIS — E7800 Pure hypercholesterolemia, unspecified: Secondary | ICD-10-CM | POA: Diagnosis not present

## 2021-01-16 DIAGNOSIS — I10 Essential (primary) hypertension: Secondary | ICD-10-CM | POA: Diagnosis not present

## 2021-01-16 DIAGNOSIS — Z952 Presence of prosthetic heart valve: Secondary | ICD-10-CM | POA: Diagnosis not present

## 2021-01-16 DIAGNOSIS — I2510 Atherosclerotic heart disease of native coronary artery without angina pectoris: Secondary | ICD-10-CM | POA: Diagnosis not present

## 2021-01-16 DIAGNOSIS — I739 Peripheral vascular disease, unspecified: Secondary | ICD-10-CM | POA: Diagnosis not present

## 2021-02-13 DIAGNOSIS — M2012 Hallux valgus (acquired), left foot: Secondary | ICD-10-CM | POA: Diagnosis not present

## 2021-02-13 DIAGNOSIS — T148XXA Other injury of unspecified body region, initial encounter: Secondary | ICD-10-CM | POA: Diagnosis not present

## 2021-02-13 DIAGNOSIS — W19XXXA Unspecified fall, initial encounter: Secondary | ICD-10-CM | POA: Diagnosis not present

## 2021-05-07 DIAGNOSIS — E1165 Type 2 diabetes mellitus with hyperglycemia: Secondary | ICD-10-CM | POA: Diagnosis not present

## 2021-05-07 DIAGNOSIS — I10 Essential (primary) hypertension: Secondary | ICD-10-CM | POA: Diagnosis not present

## 2021-05-07 DIAGNOSIS — E7800 Pure hypercholesterolemia, unspecified: Secondary | ICD-10-CM | POA: Diagnosis not present

## 2021-05-07 DIAGNOSIS — C50911 Malignant neoplasm of unspecified site of right female breast: Secondary | ICD-10-CM | POA: Diagnosis not present

## 2021-05-26 DIAGNOSIS — I10 Essential (primary) hypertension: Secondary | ICD-10-CM | POA: Diagnosis not present

## 2021-05-26 DIAGNOSIS — E1165 Type 2 diabetes mellitus with hyperglycemia: Secondary | ICD-10-CM | POA: Diagnosis not present

## 2021-05-26 DIAGNOSIS — I739 Peripheral vascular disease, unspecified: Secondary | ICD-10-CM | POA: Diagnosis not present

## 2021-05-26 DIAGNOSIS — Z0000 Encounter for general adult medical examination without abnormal findings: Secondary | ICD-10-CM | POA: Diagnosis not present

## 2021-06-02 DIAGNOSIS — Z853 Personal history of malignant neoplasm of breast: Secondary | ICD-10-CM | POA: Diagnosis not present

## 2021-06-02 DIAGNOSIS — I739 Peripheral vascular disease, unspecified: Secondary | ICD-10-CM | POA: Diagnosis not present

## 2021-06-02 DIAGNOSIS — I10 Essential (primary) hypertension: Secondary | ICD-10-CM | POA: Diagnosis not present

## 2021-06-02 DIAGNOSIS — N184 Chronic kidney disease, stage 4 (severe): Secondary | ICD-10-CM | POA: Diagnosis not present

## 2021-06-02 DIAGNOSIS — Z952 Presence of prosthetic heart valve: Secondary | ICD-10-CM | POA: Diagnosis not present

## 2021-06-02 DIAGNOSIS — E1165 Type 2 diabetes mellitus with hyperglycemia: Secondary | ICD-10-CM | POA: Diagnosis not present

## 2021-06-02 DIAGNOSIS — Z0000 Encounter for general adult medical examination without abnormal findings: Secondary | ICD-10-CM | POA: Diagnosis not present

## 2021-06-02 DIAGNOSIS — E7800 Pure hypercholesterolemia, unspecified: Secondary | ICD-10-CM | POA: Diagnosis not present

## 2021-06-04 DIAGNOSIS — L720 Epidermal cyst: Secondary | ICD-10-CM | POA: Diagnosis not present

## 2021-06-04 DIAGNOSIS — L821 Other seborrheic keratosis: Secondary | ICD-10-CM | POA: Diagnosis not present

## 2021-06-04 DIAGNOSIS — L570 Actinic keratosis: Secondary | ICD-10-CM | POA: Diagnosis not present

## 2021-06-04 DIAGNOSIS — D692 Other nonthrombocytopenic purpura: Secondary | ICD-10-CM | POA: Diagnosis not present

## 2021-06-04 DIAGNOSIS — Z85828 Personal history of other malignant neoplasm of skin: Secondary | ICD-10-CM | POA: Diagnosis not present

## 2021-06-27 ENCOUNTER — Encounter: Payer: Self-pay | Admitting: Gastroenterology

## 2021-07-22 ENCOUNTER — Other Ambulatory Visit: Payer: Self-pay | Admitting: Internal Medicine

## 2021-07-22 ENCOUNTER — Ambulatory Visit: Payer: Medicare Other | Admitting: Student

## 2021-07-22 DIAGNOSIS — Z13820 Encounter for screening for osteoporosis: Secondary | ICD-10-CM

## 2021-07-23 ENCOUNTER — Other Ambulatory Visit: Payer: Self-pay

## 2021-07-23 ENCOUNTER — Encounter: Payer: Self-pay | Admitting: Student

## 2021-07-23 ENCOUNTER — Ambulatory Visit: Payer: Medicare Other | Admitting: Student

## 2021-07-23 VITALS — BP 132/67 | HR 96 | Temp 97.1°F | Resp 16 | Ht 62.0 in | Wt 153.0 lb

## 2021-07-23 DIAGNOSIS — I2510 Atherosclerotic heart disease of native coronary artery without angina pectoris: Secondary | ICD-10-CM

## 2021-07-23 DIAGNOSIS — Z952 Presence of prosthetic heart valve: Secondary | ICD-10-CM

## 2021-07-23 DIAGNOSIS — E7800 Pure hypercholesterolemia, unspecified: Secondary | ICD-10-CM

## 2021-07-23 DIAGNOSIS — I739 Peripheral vascular disease, unspecified: Secondary | ICD-10-CM

## 2021-07-23 DIAGNOSIS — I10 Essential (primary) hypertension: Secondary | ICD-10-CM

## 2021-07-23 NOTE — Progress Notes (Signed)
Primary Physician/Referring:  Melida Quitter, MD  Patient ID: Mallory Thomas, female    DOB: 1932-09-05, 87 y.o.   MRN: [id number]  Chief Complaint  Patient presents with   Hypertension   Follow-up   HPI:    Mallory Thomas  is a 87 y.o. Caucasian female  with history of breast cancer status post radiation therapy he and lumpectomy on the right breast in 2000 hypertension, diabetes mellitus, hyperlipidemia, peripheral arterial disease with right iliac artery stenting in 2017, patent by angiography on 11/29/2018, coronary artery disease with ostial RCA 70-80% stenosis by angiography on 12/24/2017, TAVR for critical AS on 02/15/2018 and presents here for follow-up.  Patient had severe myalgias and once progression of PAD was excluded, high-dose atorvastatin was discontinued and she was placed on 10 mg of atorvastatin.  She was started on Repatha but has discontinued this on her own.   Patient presents for 3-month follow-up of hypertension, hyperlipidemia, PAD, CAD.  Patient remains asymptomatic without specific complaints today.  Denies chest pain, dyspnea, syncope, near syncope, dizziness.  Denies PND, leg swelling, orthopnea.  Patient remains physically active doing water aerobics and deep water aerobics 5 to 7 days/week for 1 to 1.5 hours without issue.  She has upcoming annual physical in December with planned lipid profile testing.  Past Medical History:  Diagnosis Date   Aortic stenosis    Breast cancer (HCC) 2004   right   Diabetes mellitus    Heart murmur    Hyperlipidemia    Hypertension    S/P TAVR (transcatheter aortic valve replacement) 02/15/2018   23 mm Edwards Sapien 3 transcatheter heart valve placed via percutaneous right transfemoral approach    Past Surgical History:  Procedure Laterality Date   ABDOMINAL AORTOGRAM N/A 11/22/2018   Procedure: ABDOMINAL AORTOGRAM;  Surgeon: Yates Decamp, MD;  Location: [facility]VASIVE [facility];  Service: Cardiovascular;  Laterality: N/A;    BILATERAL KNEE ARTHROSCOPY     BREAST LUMPECTOMY Right 2004   radiation    CARDIAC CATHETERIZATION     12/24/17   INTRAVASCULAR ULTRASOUND/IVUS N/A 12/24/2017   Procedure: Intravascular Ultrasound/IVUS;  Surgeon: Yates Decamp, MD;  Location: [facility];  Service: Cardiovascular;  Laterality: N/A;   LOWER EXTREMITY ANGIOGRAPHY Bilateral 11/22/2018   Procedure: LOWER EXTREMITY ANGIOGRAPHY;  Surgeon: Yates Decamp, MD;  Location: [facility];  Service: Cardiovascular;  Laterality: Bilateral;   lymph node removal right arm     PERIPHERAL VASCULAR CATHETERIZATION N/A 11/10/2016   Procedure: Lower Extremity Angiography;  Surgeon: Yates Decamp, MD;  Location: [facility] INVASIVE CV LAB;  Service: Cardiovascular;  Laterality: N/A;   PERIPHERAL VASCULAR CATHETERIZATION Right 11/10/2016   Procedure: Peripheral Vascular Intervention;  Surgeon: Yates Decamp, MD;  Location: [facility] INVASIVE CV LAB;  Service: Cardiovascular;  Laterality: Right;  Rt Com Iliac   TEE WITHOUT CARDIOVERSION N/A 02/15/2018   Procedure: TRANSESOPHAGEAL ECHOCARDIOGRAM (TEE);  Surgeon: Tonny Bollman, MD;  Location: [facility] OR;  Service: Open Heart Surgery;  Laterality: N/A;   TONSILLECTOMY     TRANSCATHETER AORTIC VALVE REPLACEMENT, TRANSFEMORAL N/A 02/15/2018   Procedure: TRANSCATHETER AORTIC VALVE REPLACEMENT, TRANSFEMORAL;  Surgeon: Tonny Bollman, MD;  Location: [facility] OR;  Service: Open Heart Surgery;  Laterality: N/A;   Family History  Problem Relation Age of Onset   Heart attack Mother    Heart attack Father    Breast cancer Neg Hx    Social History   Tobacco Use   Smoking status: Former    Packs/day:  1.00    Years: 15.00    Pack years: 15.00    Types: Cigarettes    Quit date: 06/04/1968    Years since quitting: 53.1   Smokeless tobacco: Never  Substance Use Topics   Alcohol use: Yes    Comment: occasional   ROS  Review of Systems  Constitutional: Negative for malaise/fatigue.  Cardiovascular:  Negative for chest pain,  claudication, dyspnea on exertion, leg swelling, near-syncope, orthopnea, palpitations, paroxysmal nocturnal dyspnea and syncope.  Respiratory:  Negative for shortness of breath.   Neurological:  Negative for dizziness.  Objective  Blood pressure 132/67, pulse 96, temperature (!) 97.1 F (36.2 C), temperature source Temporal, resp. rate 16, height 5\' 2"  (1.575 m), weight 153 lb (69.4 kg), SpO2 97 %.  Vitals with BMI 07/23/2021 01/16/2021 12/05/2020  Height 5\' 2"  5\' 2"  5\' 2"   Weight 153 lbs 155 lbs 155 lbs  BMI 27.98 28.34 28.34  Systolic 132 148 12/07/2020  Diastolic 67 72 80  Pulse 96 89 101     Physical Exam Vitals reviewed.  HENT:     Head: Normocephalic and atraumatic.  Cardiovascular:     Rate and Rhythm: Normal rate and regular rhythm.     Pulses: Intact distal pulses.     Heart sounds: Normal heart sounds, S1 normal and S2 normal. No murmur heard.   No gallop.     Comments: No JVD. Pulmonary:     Effort: Pulmonary effort is normal. No respiratory distress.     Breath sounds: Normal breath sounds. No wheezing, rhonchi or rales.  Musculoskeletal:     Right lower leg: No edema.     Left lower leg: No edema.  Skin:    General: Skin is warm and dry.  Neurological:     Mental Status: She is alert.   Laboratory examination:   Recent Labs    07/30/20 0848  NA 140  K 4.1  CL 103  CO2 30  GLUCOSE 143*  BUN 26*  CREATININE 1.08*  CALCIUM 9.3  GFRNONAA 47*  GFRAA 54*   CrCl cannot be calculated (Patient's most recent lab result is older than the maximum 21 days allowed.).  CMP Latest Ref Rng & Units 07/30/2020 12/08/2019 06/23/2019  Glucose 70 - 99 mg/dL 08/01/20) 08/01/2020) 12/10/2019)  BUN 8 - 23 mg/dL 08/23/2019) 25 25  Creatinine 0.44 - 1.00 mg/dL 519(S) 071(G) 538(Q)  Sodium 135 - 145 mmol/L 140 143 142  Potassium 3.5 - 5.1 mmol/L 4.1 5.2 4.5  Chloride 98 - 111 mmol/L 103 105 106  CO2 22 - 32 mmol/L 30 24 19(L)  Calcium 8.9 - 10.3 mg/dL 9.3 55(H) 9.1  Total Protein 6.5 - 8.1 g/dL 6.8  6.9 -  Total Bilirubin 0.3 - 1.2 mg/dL 0.5 0.4 -  Alkaline Phos 38 - 126 U/L 41 55 -  AST 15 - 41 U/L 15 16 -  ALT 0 - 44 U/L <6 6 -   CBC Latest Ref Rng & Units 07/30/2020 06/30/2018 03/03/2018  WBC 4.0 - 10.5 K/uL 4.5 3.8(L) 5.5  Hemoglobin 12.0 - 15.0 g/dL 6.5(P 11.3(L) 11.3  Hematocrit 36.0 - 46.0 % 37.9 34.9 33.6(L)  Platelets 150 - 400 K/uL 161 128(L) 209   Lipid Panel     Component Value Date/Time   CHOL 219 (H) 12/08/2019 0907   TRIG 63 12/08/2019 0907   HDL 119 12/08/2019 0907   CHOLHDL 3.3 05/14/2009 0915   VLDL 13 05/14/2009 0915   LDLCALC 89 12/08/2019 0907  HEMOGLOBIN A1C Lab Results  Component Value Date   HGBA1C 6.7 (H) 02/11/2018   MPG 145.59 02/11/2018   A1C 7.200 % 10/25/2019  TSH No results for input(s): TSH in the last 8760 hours. TSH 3.850 11/14/2018  11/15/2018: RBC 3.61, normal H&H, platelets minimally decreased at 147, CBC otherwise normal. Glucose 112, creatinine 1.13, EGFR 45/52, potassium 5.3 , CMP otherwise normal. TSH normal at 3.8.   Cholesterol 176, triglycerides 74, HDL 76, LDL 85. Hemoglobin A1c 6.2%.  Allergies   Allergies  Allergen Reactions   Fish Allergy Hives   Iodine Hives    Allergic to ALL seafood, shrimp, crab etc.   Olmesartan Other (See Comments)    Hyperkalemia   Shellfish Allergy Hives   Tape Hives   Zetia [Ezetimibe] Diarrhea    Medications Prior to Visit:   Outpatient Medications Prior to Visit  Medication Sig Dispense Refill   amLODipine (NORVASC) 10 MG tablet Take 1 tablet (10 mg total) by mouth daily. 90 tablet 3   aspirin 81 MG tablet Take 81 mg by mouth at bedtime.     atorvastatin (LIPITOR) 20 MG tablet Take 20 mg by mouth daily.     hydrochlorothiazide (MICROZIDE) 12.5 MG capsule Take 1 capsule (12.5 mg total) by mouth daily. 90 capsule 6   OZEMPIC, 0.25 OR 0.5 MG/DOSE, 2 MG/1.5ML SOPN Inject 0.5 mLs into the skin daily.     atorvastatin (LIPITOR) 10 MG tablet TAKE 1 TABLET BY MOUTH  DAILY 90 tablet 3    No facility-administered medications prior to visit.   Final Medications at End of Visit    Current Meds  Medication Sig   amLODipine (NORVASC) 10 MG tablet Take 1 tablet (10 mg total) by mouth daily.   aspirin 81 MG tablet Take 81 mg by mouth at bedtime.   atorvastatin (LIPITOR) 20 MG tablet Take 20 mg by mouth daily.   hydrochlorothiazide (MICROZIDE) 12.5 MG capsule Take 1 capsule (12.5 mg total) by mouth daily.   OZEMPIC, 0.25 OR 0.5 MG/DOSE, 2 MG/1.5ML SOPN Inject 0.5 mLs into the skin daily.    Radiology:  No results found.  Cardiac Studies:   Coronary angiogram 12/24/2017:  Severe diffuse coronary calcification. RCA ostial 70-80% and mid 80% by IVUS. Mild disease Cx & LAD. EKG 10/05/2018: Normal sinus rhythm at rate of 67 bpm, normal axis. No evidence of ischemia, normal EKG.  Carotid artery duplex 02/80/2019: Minimal stenosis and bilateral ICA.  Less than 50% stenosis left common carotid artery.  TAVR 02/15/2018: Edwards Sapien 3 THV (size 23 mm, model # 9600TFX, serial # [id number])  Abdominal aortic duplex 10/27/2018: Normal abdominal aorta. No evidence of aneurysm. Peak systolic velocities in the mid and distal aorta are moderately increased to 245.72 cm/s. suggests > 50% stenosis. Peak systolic velocities in the right internal iliac artery are severely increased to 303.74 cm/s. Peak systolic velocities in the left internal iliac artery are moderately increased to 259.37 cm/s. suggestive of >50% stenosis. Compared to 06/16/2017, previously normal velocity. Consider further work up if clinically indicated  Peripheral arteriogram 11/29/2018: Distal aorta diffuse 30% stenosis. Right renal superior pole 95% stenosis. Right CIA 80% to 0% with 10x30 mm self expanding Absolute Pro placed on 11/10/2018 widely patent. Left mild disease.  Echocardiogram 03/29/2019:  1. The left ventricle has normal systolic function with an ejection fraction of 60-65%. The cavity size was normal.  There is mild concentric left ventricular hypertrophy. Left ventricular diastolic Doppler parameters are consistent with impaired  relaxation. Elevated  mean left atrial pressure.  2. The right ventricle has normal systolic function. The cavity was normal. There is no increase in right ventricular wall thickness.  3. Left atrial size was moderately dilated.  4. Right atrial size was mildly dilated.  5. Aortic valve regurgitation is trivial by color flow Doppler. Moderate stenosis of the aortic valve.  6. - TAVR: 23 mm Edwards Sapien 3 TAVR bioprosthesis present. Mean gradient 22 mmHg, incrased from 15 mmHg 03/2018. Peak velocity 2.87 m/s.  EKG   07/23/2021: Normal sinus rhythm at a rate of 87 bpm.  LAE. Normal axis.  No evidence of ischemia or underlying injury pattern.  Compared to EKG 12/05/2020 no significant change.  Assessment     ICD-10-CM   1. Essential hypertension  I10     2. PAD (peripheral artery disease) (HCC)  I73.9 EKG 12-Lead    3. Coronary artery disease involving native coronary artery of native heart without angina pectoris  I25.10     4. Hypercholesteremia  E78.00     5. S/P TAVR (transcatheter aortic valve replacement)  Z95.2 PCV ECHOCARDIOGRAM COMPLETE      No orders of the defined types were placed in this encounter.   Medications Discontinued During This Encounter  Medication Reason   atorvastatin (LIPITOR) 10 MG tablet Error    Recommendations:   Mallory Thomas  is a 87 y.o. Caucasian female  with history of breast cancer status post radiation therapy he and lumpectomy on the right breast in 2000 hypertension, diabetes mellitus, hyperlipidemia, peripheral arterial disease with right iliac artery stenting in 2017, patent by angiography on 11/29/2018, coronary artery disease with ostial RCA 70-80% stenosis by angiography on 12/24/2017, TAVR for critical AS on 02/15/2018 and presents here for follow-up.  Patient presents for 75-month follow-up of hypertension,  hyperlipidemia, PAD, CAD.  Patient remains asymptomatic and quite active without issue.  Her blood pressure is well controlled.  There is no recent lipid profile testing for review, however patient has upcoming annual physical, will defer lipid profile testing to PCP.  We will continue Lipitor.  Patient has not had repeat echo in the last 2 years, will obtain echo prior to next office visit.  No changes are made to her medications at this time.  Follow-up in 1 year, sooner if needed, for hypertension, hyperlipidemia, PAD, CAD, s/p TAVR.    Mallory Berthold, PA-C 07/23/2021, 3:13 PM Office: [phone]

## 2021-07-29 ENCOUNTER — Other Ambulatory Visit: Payer: Self-pay | Admitting: *Deleted

## 2021-07-29 DIAGNOSIS — Z170 Estrogen receptor positive status [ER+]: Secondary | ICD-10-CM

## 2021-07-30 ENCOUNTER — Inpatient Hospital Stay: Payer: Medicare Other | Attending: Oncology | Admitting: Oncology

## 2021-07-30 ENCOUNTER — Inpatient Hospital Stay: Payer: Medicare Other

## 2021-07-30 ENCOUNTER — Other Ambulatory Visit: Payer: Self-pay | Admitting: Oncology

## 2021-07-30 ENCOUNTER — Other Ambulatory Visit: Payer: Self-pay

## 2021-07-30 VITALS — BP 145/61 | HR 83 | Temp 97.5°F | Resp 18 | Ht 62.0 in | Wt 149.7 lb

## 2021-07-30 DIAGNOSIS — N183 Chronic kidney disease, stage 3 unspecified: Secondary | ICD-10-CM | POA: Insufficient documentation

## 2021-07-30 DIAGNOSIS — Z7984 Long term (current) use of oral hypoglycemic drugs: Secondary | ICD-10-CM | POA: Diagnosis not present

## 2021-07-30 DIAGNOSIS — I350 Nonrheumatic aortic (valve) stenosis: Secondary | ICD-10-CM | POA: Insufficient documentation

## 2021-07-30 DIAGNOSIS — Z170 Estrogen receptor positive status [ER+]: Secondary | ICD-10-CM | POA: Diagnosis not present

## 2021-07-30 DIAGNOSIS — Z923 Personal history of irradiation: Secondary | ICD-10-CM | POA: Diagnosis not present

## 2021-07-30 DIAGNOSIS — Z79899 Other long term (current) drug therapy: Secondary | ICD-10-CM | POA: Insufficient documentation

## 2021-07-30 DIAGNOSIS — Z952 Presence of prosthetic heart valve: Secondary | ICD-10-CM | POA: Diagnosis not present

## 2021-07-30 DIAGNOSIS — C50811 Malignant neoplasm of overlapping sites of right female breast: Secondary | ICD-10-CM

## 2021-07-30 DIAGNOSIS — Z1231 Encounter for screening mammogram for malignant neoplasm of breast: Secondary | ICD-10-CM

## 2021-07-30 DIAGNOSIS — Z87891 Personal history of nicotine dependence: Secondary | ICD-10-CM | POA: Diagnosis not present

## 2021-07-30 DIAGNOSIS — E785 Hyperlipidemia, unspecified: Secondary | ICD-10-CM | POA: Diagnosis not present

## 2021-07-30 DIAGNOSIS — Z853 Personal history of malignant neoplasm of breast: Secondary | ICD-10-CM | POA: Diagnosis not present

## 2021-07-30 DIAGNOSIS — E1122 Type 2 diabetes mellitus with diabetic chronic kidney disease: Secondary | ICD-10-CM | POA: Diagnosis not present

## 2021-07-30 DIAGNOSIS — Z7982 Long term (current) use of aspirin: Secondary | ICD-10-CM | POA: Insufficient documentation

## 2021-07-30 DIAGNOSIS — I129 Hypertensive chronic kidney disease with stage 1 through stage 4 chronic kidney disease, or unspecified chronic kidney disease: Secondary | ICD-10-CM | POA: Diagnosis not present

## 2021-07-30 LAB — CMP (CANCER CENTER ONLY)
ALT: <6 U/L (ref 0–44)
AST: 14 U/L — ABNORMAL LOW (ref 15–41)
Albumin: 4.2 g/dL (ref 3.5–5.0)
Alkaline Phosphatase: 53 U/L (ref 38–126)
Anion gap: 12 (ref 5–15)
BUN: 38 mg/dL — ABNORMAL HIGH (ref 8–23)
CO2: 23 mmol/L (ref 22–32)
Calcium: 9.5 mg/dL (ref 8.9–10.3)
Chloride: 106 mmol/L (ref 98–111)
Creatinine: 1.86 mg/dL — ABNORMAL HIGH (ref 0.44–1.00)
GFR, Estimated: 26 mL/min — ABNORMAL LOW (ref >60)
Glucose, Bld: 120 mg/dL — ABNORMAL HIGH (ref 70–99)
Potassium: 4.4 mmol/L (ref 3.5–5.1)
Sodium: 141 mmol/L (ref 135–145)
Total Bilirubin: 0.4 mg/dL (ref 0.3–1.2)
Total Protein: 7.4 g/dL (ref 6.5–8.1)

## 2021-07-30 LAB — CBC WITH DIFFERENTIAL (CANCER CENTER ONLY)
Abs Immature Granulocytes: 0.01 10*3/uL (ref 0.00–0.07)
Basophils Absolute: 0 10*3/uL (ref 0.0–0.1)
Basophils Relative: 1 %
Eosinophils Absolute: 0.1 10*3/uL (ref 0.0–0.5)
Eosinophils Relative: 3 %
HCT: 34.8 % — ABNORMAL LOW (ref 36.0–46.0)
Hemoglobin: 11.5 g/dL — ABNORMAL LOW (ref 12.0–15.0)
Immature Granulocytes: 0 %
Lymphocytes Relative: 25 %
Lymphs Abs: 1.1 10*3/uL (ref 0.7–4.0)
MCH: 30.8 pg (ref 26.0–34.0)
MCHC: 33 g/dL (ref 30.0–36.0)
MCV: 93.3 fL (ref 80.0–100.0)
Monocytes Absolute: 0.4 10*3/uL (ref 0.1–1.0)
Monocytes Relative: 9 %
Neutro Abs: 2.8 10*3/uL (ref 1.7–7.7)
Neutrophils Relative %: 62 %
Platelet Count: 161 10*3/uL (ref 150–400)
RBC: 3.73 MIL/uL — ABNORMAL LOW (ref 3.87–5.11)
RDW: 13.7 % (ref 11.5–15.5)
WBC Count: 4.4 10*3/uL (ref 4.0–10.5)
nRBC: 0 % (ref 0.0–0.2)

## 2021-07-30 NOTE — Progress Notes (Signed)
ID: Mallory Thomas   DOB: 21-Jan-1932  MR#: [id number]  ZCH#:[id number]  Patient Care Team: Michael Boston, MD as PCP - General (Internal Medicine) Fallyn Munnerlyn, Virgie Dad, MD (Hematology and Oncology) Tavarus Poteete, Virgie Dad, MD as Consulting Physician (Oncology) Clarene Essex, MD as Consulting Physician (Gastroenterology) Adrian Prows, MD as Consulting Physician (Cardiology) Sherren Mocha, MD as Consulting Physician (Cardiology) Carlyon Shadow, MD as Consulting Physician (Obstetrics and Gynecology)  OTHER MD:   CHIEF COMPLAINT: Right-sided breast cancer  CURRENT TREATMENT: Observation   INTERVAL HISTORY: Mallory Thomas returns today for follow-up of her remote estrogen receptor positive breast cancer. She continues under observation.  Since her last visit, she underwent bilateral screening mammography with tomography at [facility] on 08/14/2020 showing: breast density category B; no evidence of malignancy in either breast.  She is scheduled for this year's annual mammogram on 09/02/2021. She is also scheduled for bone density screening on 01/08/2022.    REVIEW OF SYSTEMS: Mallory Thomas continues to be very active, going to water aerobics about 5 days a week.  She does all her housework cooking driving and living by herself is totally independent.  She is also doing some quilting.  A detailed review of systems was otherwise noncontributory    PAST MEDICAL HISTORY: Past Medical History:  Diagnosis Date   Aortic stenosis    Breast cancer ([place]) 2004   right   Diabetes mellitus    Heart murmur    Hyperlipidemia    Hypertension    S/P TAVR (transcatheter aortic valve replacement) 02/15/2018   23 mm Edwards Sapien 3 transcatheter heart valve placed via percutaneous right transfemoral approach   history cat scratch disease Squamous cell skin cancers (Dr Lindwood Coke)   PAST SURGICAL HISTORY: Past Surgical History:  Procedure Laterality Date   ABDOMINAL AORTOGRAM N/A 11/22/2018    Procedure: ABDOMINAL AORTOGRAM;  Surgeon: Adrian Prows, MD;  Location: [place] CV LAB;  Service: Cardiovascular;  Laterality: N/A;   BILATERAL KNEE ARTHROSCOPY     BREAST LUMPECTOMY Right 2004   radiation    CARDIAC CATHETERIZATION     12/24/17   INTRAVASCULAR ULTRASOUND/IVUS N/A 12/24/2017   Procedure: Intravascular Ultrasound/IVUS;  Surgeon: Adrian Prows, MD;  Location: [place] CV LAB;  Service: Cardiovascular;  Laterality: N/A;   LOWER EXTREMITY ANGIOGRAPHY Bilateral 11/22/2018   Procedure: LOWER EXTREMITY ANGIOGRAPHY;  Surgeon: Adrian Prows, MD;  Location: [place] CV LAB;  Service: Cardiovascular;  Laterality: Bilateral;   lymph node removal right arm     PERIPHERAL VASCULAR CATHETERIZATION N/A 11/10/2016   Procedure: Lower Extremity Angiography;  Surgeon: Adrian Prows, MD;  Location: [place] CV LAB;  Service: Cardiovascular;  Laterality: N/A;   PERIPHERAL VASCULAR CATHETERIZATION Right 11/10/2016   Procedure: Peripheral Vascular Intervention;  Surgeon: Adrian Prows, MD;  Location: [place] CV LAB;  Service: Cardiovascular;  Laterality: Right;  Rt Com Iliac   TEE WITHOUT CARDIOVERSION N/A 02/15/2018   Procedure: TRANSESOPHAGEAL ECHOCARDIOGRAM (TEE);  Surgeon: Sherren Mocha, MD;  Location: [place];  Service: Open Heart Surgery;  Laterality: N/A;   TONSILLECTOMY     TRANSCATHETER AORTIC VALVE REPLACEMENT, TRANSFEMORAL N/A 02/15/2018   Procedure: TRANSCATHETER AORTIC VALVE REPLACEMENT, TRANSFEMORAL;  Surgeon: Sherren Mocha, MD;  Location: [place];  Service: Open Heart Surgery;  Laterality: N/A;    FAMILY HISTORY Family History  Problem Relation Age of Onset   Heart attack Mother    Heart attack Father    Breast cancer Neg Hx   The patient's family were [place].  Her father died at the age of 40 from a heart attack.  Her mother died at age 52, and she had a breast cancer diagnosed at age 59.  She had a mastectomy and Tamoxifen at that time.    GYNECOLOGIC  HISTORY: She is G3, F3, P0, A0, L3.  Menopause age 50.  She never took hormones.   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  Her husband died in the year Jan 22, 1999.  He had lung cancer and was taken care of by Dr. Ralene Ok.  Her children are Heidi (who lives in [place] and sells medications to labs); Shanon Brow (who lives in [place] and is in [employer]); and Medical sales representative (who lives in [place] and is currently a homemaker.).  The patient has three grandsons: Barnabas Lister, 22, working for [employer]; Case, 21 a senior in college; and Hart Carwin; 14.  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     ADVANCED DIRECTIVES: In place   HEALTH MAINTENANCE: Social History   Tobacco Use   Smoking status: Former    Packs/day: 1.00    Years: 15.00    Pack years: 15.00    Types: Cigarettes    Quit date: 06/04/1968    Years since quitting: 53.1   Smokeless tobacco: Never  Vaping Use   Vaping Use: Never used  Substance Use Topics   Alcohol use: Yes    Comment: occasional   Drug use: No     Colonoscopy: 11/01/2015/ [facility]  PAP:  Bone density: 22-Jan-2013  Lipid panel:  Allergies  Allergen Reactions   Fish Allergy Hives   Iodine Hives    Allergic to ALL seafood, shrimp, crab etc.   Olmesartan Other (See Comments)    Hyperkalemia   Shellfish Allergy Hives   Tape Hives   Zetia [Ezetimibe] Diarrhea    Current Outpatient Medications  Medication Sig Dispense Refill   amLODipine (NORVASC) 10 MG tablet Take 1 tablet (10 mg total) by mouth daily. 90 tablet 3   aspirin 81 MG tablet Take 81 mg by mouth at bedtime.     atorvastatin (LIPITOR) 20 MG tablet Take 20 mg by mouth daily.     hydrochlorothiazide (MICROZIDE) 12.5 MG capsule Take 1 capsule (12.5 mg total) by mouth daily. 90 capsule 6   OZEMPIC, 0.25 OR 0.5 MG/DOSE, 2 MG/1.5ML SOPN Inject 0.5 mLs into the skin daily.     No current  facility-administered medications for this visit.    OBJECTIVE: White woman who appears well  There were no vitals filed for this visit.    There is no height or weight on file to calculate BMI.      Sclerae unicteric, EOMs intact Wearing a mask No cervical or supraclavicular adenopathy Lungs no rales or rhonchi Heart regular rate and rhythm Abd soft, nontender, positive bowel sounds MSK no focal spinal tenderness, no upper extremity lymphedema Neuro: nonfocal, well oriented, appropriate affect Breasts: Status post right lumpectomy with no evidence of disease recurrence.  The left breast and both axillae are benign.   LAB RESULTS: Lab Results  Component Value Date   WBC 4.5 07/30/2020   NEUTROABS 2.4 07/30/2020   HGB 12.4 07/30/2020   HCT 37.9 07/30/2020   MCV 97.2 07/30/2020   PLT 161 07/30/2020      Chemistry      Component Value Date/Time   NA 140 07/30/2020 0848  NA 143 12/08/2019 0907   NA 141 06/24/2017 1333   K 4.1 07/30/2020 0848   K 4.8 06/24/2017 1333   CL 103 07/30/2020 0848   CO2 30 07/30/2020 0848   CO2 26 06/24/2017 1333   BUN 26 (H) 07/30/2020 0848   BUN 25 12/08/2019 0907   BUN 25.6 06/24/2017 1333   CREATININE 1.08 (H) 07/30/2020 0848   CREATININE 1.3 (H) 06/24/2017 1333      Component Value Date/Time   CALCIUM 9.3 07/30/2020 0848   CALCIUM 9.1 06/24/2017 1333   ALKPHOS 41 07/30/2020 0848   ALKPHOS 50 06/24/2017 1333   AST 15 07/30/2020 0848   AST 15 06/24/2017 1333   ALT <6 07/30/2020 0848   ALT 7 06/24/2017 1333   BILITOT 0.5 07/30/2020 0848   BILITOT 0.4 12/08/2019 0907   BILITOT 0.43 06/24/2017 1333       Lab Results  Component Value Date   LABCA2 13 05/17/2008    No components found for: [id number]  No results for input(s): INR in the last 168 hours.  Urinalysis    Component Value Date/Time   COLORURINE STRAW (A) 02/11/2018 1208    STUDIES: No results found.   ASSESSMENT: 87 y.o. [place] woman status post right  lumpectomy and sentinel lymph node biopsy June 2004 for a 1.2 cm estrogen and progesterone receptor positive, HER2/neu negative, grade 2 invasive ductal carcinoma, lymph node negative,   (1) on anastrozole between July 2004 and July 2009 through the MA27 study, note that the study still is unblinded so we do not know if she took 5 or 10 years of aromatase inhibitors  (2) enrolled in the B42 study as of July 2009 and received either placebo or letrozole.   (3) normal bone density 09/13/2013 at [facility].  (4) CKD stage II-III   PLAN:  Mallory Thomas is now 18 years out from definitive surgery for her breast cancer with no evidence of disease recurrence.  This is very favorable.  She enrolled on various studies here although I believe all of them have "run out" at this time.  While I feel comfortable releasing her to her primary care physician and she tells me she derive significant benefit from seeing [place] on a once a year basis.  We can easily accommodate that.  Total encounter time 20 minutes.*   Mallory Thomas, Virgie Dad, MD  07/30/21 10:42 AM Medical Oncology and Hematology [facility] [place], [place] [address] Tel. [phone]    Fax. [phone]   I, Wilburn Mylar, am acting as scribe for Dr. Virgie Dad. Mallory Thomas.  I, Lurline Del MD, have reviewed the above documentation for accuracy and completeness, and I agree with the above.   *Total Encounter Time as defined by the Centers for Medicare and Medicaid Services includes, in addition to the face-to-face time of a patient visit (documented in the note above) non-face-to-face time: obtaining and reviewing outside history, ordering and reviewing medications, tests or procedures, care coordination (communications with other health care professionals or caregivers) and documentation in the medical record.

## 2021-09-02 ENCOUNTER — Ambulatory Visit: Payer: Medicare Other

## 2021-10-03 ENCOUNTER — Ambulatory Visit
Admission: RE | Admit: 2021-10-03 | Discharge: 2021-10-03 | Disposition: A | Discharge status: Home / Self Care | Payer: Medicare Other | Source: Ambulatory Visit | Attending: Oncology | Admitting: Oncology

## 2021-10-03 ENCOUNTER — Other Ambulatory Visit: Payer: Self-pay

## 2021-10-03 DIAGNOSIS — Z1231 Encounter for screening mammogram for malignant neoplasm of breast: Secondary | ICD-10-CM

## 2021-12-03 ENCOUNTER — Other Ambulatory Visit: Payer: Self-pay | Admitting: Cardiology

## 2021-12-03 DIAGNOSIS — I10 Essential (primary) hypertension: Secondary | ICD-10-CM

## 2022-01-08 ENCOUNTER — Ambulatory Visit
Admission: RE | Admit: 2022-01-08 | Discharge: 2022-01-08 | Disposition: A | Discharge status: Home / Self Care | Payer: Medicare Other | Source: Ambulatory Visit | Attending: Internal Medicine | Admitting: Internal Medicine

## 2022-01-08 DIAGNOSIS — Z13820 Encounter for screening for osteoporosis: Secondary | ICD-10-CM

## 2022-01-20 ENCOUNTER — Other Ambulatory Visit: Payer: Self-pay

## 2022-01-20 ENCOUNTER — Ambulatory Visit: Payer: Medicare Other

## 2022-01-20 DIAGNOSIS — Z952 Presence of prosthetic heart valve: Secondary | ICD-10-CM

## 2022-07-23 ENCOUNTER — Ambulatory Visit: Payer: Medicare Other | Admitting: Internal Medicine

## 2022-07-28 ENCOUNTER — Encounter: Payer: Self-pay | Admitting: Internal Medicine

## 2022-07-28 ENCOUNTER — Ambulatory Visit: Payer: Medicare Other | Admitting: Internal Medicine

## 2022-07-28 VITALS — BP 124/61 | HR 87 | Temp 98.7°F | Resp 16 | Ht 62.0 in | Wt 149.0 lb

## 2022-07-28 DIAGNOSIS — I739 Peripheral vascular disease, unspecified: Secondary | ICD-10-CM

## 2022-07-28 DIAGNOSIS — I2510 Atherosclerotic heart disease of native coronary artery without angina pectoris: Secondary | ICD-10-CM

## 2022-07-28 DIAGNOSIS — I10 Essential (primary) hypertension: Secondary | ICD-10-CM

## 2022-07-28 NOTE — Progress Notes (Signed)
Primary Physician/Referring:  Michael Boston, MD  Patient ID: Mallory Thomas, female    DOB: [birth year], 88 y.o.   MRN: [id number]  Chief Complaint  Patient presents with   TAVR   Coronary Artery Disease   Hypertension   Follow-up    1 year   HPI:    Mallory Thomas  is a 88 y.o. Caucasian female  with history of breast cancer status post radiation therapy he and lumpectomy on the right breast in 2000 hypertension, diabetes mellitus, hyperlipidemia, peripheral arterial disease with right iliac artery stenting in 2017, patent by angiography on 11/29/2018, coronary artery disease with ostial RCA 70-80% stenosis by angiography on 12/24/2017, TAVR for critical AS on 02/15/2018 and presents here for follow-up.  Patient had severe myalgias and once progression of PAD was excluded, she is or atorvastatin '20mg'$ .  She was previously started on Repatha but discontinued this on her own due to cost.   Patient presents for 1 year follow-up of hypertension, hyperlipidemia, PAD, CAD.  Patient remains asymptomatic without specific complaints today.  Denies chest pain, dyspnea, syncope, near syncope, dizziness.  Denies PND, leg swelling, orthopnea.  Patient remains physically active doing water aerobics and deep water aerobics 5 to 7 days/week for 1 to 1.5 hours without issue.  She has upcoming annual physical in December with planned lipid profile testing.  Past Medical History:  Diagnosis Date   Aortic stenosis    Breast cancer ([place]) 2004   right   Diabetes mellitus    Heart murmur    Hyperlipidemia    Hypertension    S/P TAVR (transcatheter aortic valve replacement) 02/15/2018   23 mm Edwards Sapien 3 transcatheter heart valve placed via percutaneous right transfemoral approach    Past Surgical History:  Procedure Laterality Date   ABDOMINAL AORTOGRAM N/A 11/22/2018   Procedure: ABDOMINAL AORTOGRAM;  Surgeon: Adrian Prows, MD;  Location: [facility];  Service: Cardiovascular;   Laterality: N/A;   BILATERAL KNEE ARTHROSCOPY     BREAST LUMPECTOMY Right 2004   radiation    CARDIAC CATHETERIZATION     12/24/17   INTRAVASCULAR ULTRASOUND/IVUS N/A 12/24/2017   Procedure: Intravascular Ultrasound/IVUS;  Surgeon: Adrian Prows, MD;  Location: [facility];  Service: Cardiovascular;  Laterality: N/A;   LOWER EXTREMITY ANGIOGRAPHY Bilateral 11/22/2018   Procedure: LOWER EXTREMITY ANGIOGRAPHY;  Surgeon: Adrian Prows, MD;  Location: [facility];  Service: Cardiovascular;  Laterality: Bilateral;   lymph node removal right arm     PERIPHERAL VASCULAR CATHETERIZATION N/A 11/10/2016   Procedure: Lower Extremity Angiography;  Surgeon: Adrian Prows, MD;  Location: [facility];  Service: Cardiovascular;  Laterality: N/A;   PERIPHERAL VASCULAR CATHETERIZATION Right 11/10/2016   Procedure: Peripheral Vascular Intervention;  Surgeon: Adrian Prows, MD;  Location: [facility];  Service: Cardiovascular;  Laterality: Right;  Rt Com Iliac   TEE WITHOUT CARDIOVERSION N/A 02/15/2018   Procedure: TRANSESOPHAGEAL ECHOCARDIOGRAM (TEE);  Surgeon: Sherren Mocha, MD;  Location: [facility];  Service: Open Heart Surgery;  Laterality: N/A;   TONSILLECTOMY     TRANSCATHETER AORTIC VALVE REPLACEMENT, TRANSFEMORAL N/A 02/15/2018   Procedure: TRANSCATHETER AORTIC VALVE REPLACEMENT, TRANSFEMORAL;  Surgeon: Sherren Mocha, MD;  Location: [facility];  Service: Open Heart Surgery;  Laterality: N/A;   Family History  Problem Relation Age of Onset   Heart attack Mother    Heart attack Father    Breast cancer Neg Hx    Social History   Tobacco Use  Smoking status: Former    Packs/day: 1.00    Years: 15.00    Total pack years: 15.00    Types: Cigarettes    Quit date: 06/04/1968    Years since quitting: 54.1   Smokeless tobacco: Never  Substance Use Topics   Alcohol use: Yes    Comment: occasional   ROS  Review of Systems  Constitutional: Negative for malaise/fatigue.  Cardiovascular:  Negative  for chest pain, claudication, dyspnea on exertion, leg swelling, near-syncope, orthopnea, palpitations, paroxysmal nocturnal dyspnea and syncope.  Respiratory:  Negative for shortness of breath.   Neurological:  Negative for dizziness.   Objective  Blood pressure 124/61, pulse 87, temperature 98.7 F (37.1 C), temperature source Temporal, resp. rate 16, height '5\' 2"'$  (1.575 m), weight 149 lb (67.6 kg), SpO2 96 %.     07/28/2022    1:54 PM 07/30/2021    2:56 PM 07/23/2021    2:38 PM  Vitals with BMI  Height '5\' 2"'$  '5\' 2"'$  '5\' 2"'$   Weight 149 lbs 149 lbs 11 oz 153 lbs  BMI 27.25 09.32 35.57  Systolic 322 025 427  Diastolic 61 61 67  Pulse 87 83 96     Physical Exam Vitals reviewed.  HENT:     Head: Normocephalic and atraumatic.  Cardiovascular:     Rate and Rhythm: Normal rate and regular rhythm.     Pulses: Intact distal pulses.     Heart sounds: Normal heart sounds, S1 normal and S2 normal. No murmur heard.    No gallop.     Comments: No JVD. Pulmonary:     Effort: Pulmonary effort is normal. No respiratory distress.     Breath sounds: Normal breath sounds. No wheezing, rhonchi or rales.  Musculoskeletal:     Right lower leg: No edema.     Left lower leg: No edema.  Skin:    General: Skin is warm and dry.  Neurological:     Mental Status: She is alert.    Laboratory examination:   Recent Labs    07/30/21 1350  NA 141  K 4.4  CL 106  CO2 23  GLUCOSE 120*  BUN 38*  CREATININE 1.86*  CALCIUM 9.5  GFRNONAA 26*    CrCl cannot be calculated (Patient's most recent lab result is older than the maximum 21 days allowed.).     Latest Ref Rng & Units 07/30/2021    1:50 PM 07/30/2020    8:48 AM 12/08/2019    9:07 AM  CMP  Glucose 70 - 99 mg/dL 120  143  123   BUN 8 - 23 mg/dL 38  26  25   Creatinine 0.44 - 1.00 mg/dL 1.86  1.08  1.11   Sodium 135 - 145 mmol/L 141  140  143   Potassium 3.5 - 5.1 mmol/L 4.4  4.1  5.2   Chloride 98 - 111 mmol/L 106  103  105   CO2 22 - 32  mmol/L '23  30  24   '$ Calcium 8.9 - 10.3 mg/dL 9.5  9.3  8.6   Total Protein 6.5 - 8.1 g/dL 7.4  6.8  6.9   Total Bilirubin 0.3 - 1.2 mg/dL 0.4  0.5  0.4   Alkaline Phos 38 - 126 U/L 53  41  55   AST 15 - 41 U/L '14  15  16   '$ ALT 0 - 44 U/L <6  <6  6       Latest Ref Rng &  Units 07/30/2021    1:50 PM 07/30/2020    8:48 AM 06/30/2018   12:49 PM  CBC  WBC 4.0 - 10.5 K/uL 4.4  4.5  3.8   Hemoglobin 12.0 - 15.0 g/dL 11.5  12.4  11.3   Hematocrit 36.0 - 46.0 % 34.8  37.9  34.9   Platelets 150 - 400 K/uL 161  161  128    Lipid Panel     Component Value Date/Time   CHOL 219 (H) 12/08/2019 0907   TRIG 63 12/08/2019 0907   HDL 119 12/08/2019 0907   CHOLHDL 3.3 05/14/2009 0915   VLDL 13 05/14/2009 0915   LDLCALC 89 12/08/2019 0907   HEMOGLOBIN A1C Lab Results  Component Value Date   HGBA1C 6.7 (H) 02/11/2018   MPG 145.59 02/11/2018   A1C 6.9 % 10/21/2021  TSH No results for input(s): "TSH" in the last 8760 hours. TSH 4.08 10/21/2021  External labs: 10/21/2021:  CHOLESTEROL 282    TRIGLYCERIDES 84 HDL 68  LDL 197  CHOL/HDL RATIO 4.   LDL/HDL RATIO 2.9   NON-HDL 214.0    Allergies   Allergies  Allergen Reactions   Fish Allergy Hives   Iodine Hives    Allergic to ALL seafood, shrimp, crab etc.   Olmesartan Other (See Comments)    Hyperkalemia   Shellfish Allergy Hives   Tape Hives   Zetia [Ezetimibe] Diarrhea    Medications Prior to Visit:   Outpatient Medications Prior to Visit  Medication Sig Dispense Refill   alendronate (FOSAMAX) 70 MG tablet Take 70 mg by mouth once a week. Take with a full glass of water on an empty stomach.     amLODipine (NORVASC) 10 MG tablet TAKE 1 TABLET BY MOUTH EVERY DAY 90 tablet 3   aspirin 81 MG tablet Take 81 mg by mouth at bedtime.     atorvastatin (LIPITOR) 20 MG tablet Take 20 mg by mouth daily.     dapagliflozin propanediol (FARXIGA) 5 MG TABS tablet 1 tablet Orally Once a day for diabetes     hydrochlorothiazide (MICROZIDE)  12.5 MG capsule Take 1 capsule (12.5 mg total) by mouth daily. 90 capsule 6   OZEMPIC, 0.25 OR 0.5 MG/DOSE, 2 MG/1.5ML SOPN Inject 0.5 mLs into the skin daily.     No facility-administered medications prior to visit.   Final Medications at End of Visit    Current Meds  Medication Sig   alendronate (FOSAMAX) 70 MG tablet Take 70 mg by mouth once a week. Take with a full glass of water on an empty stomach.   amLODipine (NORVASC) 10 MG tablet TAKE 1 TABLET BY MOUTH EVERY DAY   aspirin 81 MG tablet Take 81 mg by mouth at bedtime.   atorvastatin (LIPITOR) 20 MG tablet Take 20 mg by mouth daily.   dapagliflozin propanediol (FARXIGA) 5 MG TABS tablet 1 tablet Orally Once a day for diabetes   hydrochlorothiazide (MICROZIDE) 12.5 MG capsule Take 1 capsule (12.5 mg total) by mouth daily.    Radiology:  No results found.  Cardiac Studies:   Coronary angiogram 12/24/2017:  Severe diffuse coronary calcification. RCA ostial 70-80% and mid 80% by IVUS. Mild disease Cx & LAD. EKG 10/05/2018: Normal sinus rhythm at rate of 67 bpm, normal axis. No evidence of ischemia, normal EKG.  Carotid artery duplex 02/80/2019: Minimal stenosis and bilateral ICA.  Less than 50% stenosis left common carotid artery.  TAVR 02/15/2018: Edwards Sapien 3 THV (size 23 mm, model # 9600TFX,  serial # [id number])  Abdominal aortic duplex 10/27/2018: Normal abdominal aorta. No evidence of aneurysm. Peak systolic velocities in the mid and distal aorta are moderately increased to 245.72 cm/s. suggests > 50% stenosis. Peak systolic velocities in the right internal iliac artery are severely increased to 303.74 cm/s. Peak systolic velocities in the left internal iliac artery are moderately increased to 259.37 cm/s. suggestive of >50% stenosis. Compared to 06/16/2017, previously normal velocity. Consider further work up if clinically indicated  Peripheral arteriogram 11/29/2018: Distal aorta diffuse 30% stenosis. Right renal superior  pole 95% stenosis. Right CIA 80% to 0% with 10x30 mm self expanding Absolute Pro placed on 11/10/2018 widely patent. Left mild disease.  Echocardiogram 01/20/2022: Left ventricle cavity is normal in size. Moderate concentric hypertrophy  of the left ventricle. Normal global wall motion. Normal LV systolic  function with EF 66%. Doppler evidence of grade I (impaired) diastolic  dysfunction, normal LAP  Left atrial cavity is moderately dilated.  Bioprosthetic trileaflet aortic valve Edwards Sapien 3 THV (size 23 mm),  based on historical data. Valve not well visualized. No prosthetic valve  stenosis or regurgitation.  Mild (Grade I) mitral regurgitation.  Mild tricuspid regurgitation.  No evidence of pulmonary hypertension.  Compared to previous study in 2019, severe AS resolved, TAVR new,  diastolic dysfunction improved from grade II to grade I.   EKG   07/28/2022: Normal sinus rhythm.LAE. Normal axis.  No evidence of ischemia or underlying injury pattern.  Compared to EKG 07/23/2021 no significant change.  Assessment     ICD-10-CM   1. Essential hypertension  I10     2. PAD (peripheral artery disease) (HCC)  I73.9     3. Coronary artery disease involving native coronary artery of native heart without angina pectoris  I25.10 EKG 12-Lead      No orders of the defined types were placed in this encounter.   Medications Discontinued During This Encounter  Medication Reason   OZEMPIC, 0.25 OR 0.5 MG/DOSE, 2 MG/1.5ML SOPN     Recommendations:   Mallory Thomas  is a 88 y.o. Caucasian female  with history of breast cancer status post radiation therapy he and lumpectomy on the right breast in 2000 hypertension, diabetes mellitus, hyperlipidemia, peripheral arterial disease with right iliac artery stenting in 2017, patent by angiography on 11/29/2018, coronary artery disease with ostial RCA 70-80% stenosis by angiography on 12/24/2017, TAVR for critical AS on 02/15/2018 and presents here  for follow-up.  Patient presents 1 year follow-up of hypertension, hyperlipidemia, PAD, CAD.  Patient remains asymptomatic and quite active without issue.  She exercises 3 times a week at the [facility]. Her blood pressure is well controlled. No changes are made to her medications at this time.  Follow-up in 1 year, sooner if needed, for hypertension, hyperlipidemia, PAD, CAD, s/p TAVR.    Mallory Spell, PA-C 07/28/2022, 2:06 PM Office: [phone]

## 2022-08-23 NOTE — Progress Notes (Signed)
 Patient Care Team: Wile, Laura H, MD as PCP - General (Internal Medicine) Magrinat, Gustav C, MD (Inactive) (Hematology and Oncology) Magrinat, Gustav C, MD (Inactive) as Consulting Physician (Oncology) Magod, Marc, MD as Consulting Physician (Gastroenterology) Ganji, Jay, MD as Consulting Physician (Cardiology) Cooper, Michael, MD as Consulting Physician (Cardiology) Glasser, Timothy A, MD as Consulting Physician (Obstetrics and Gynecology)  DIAGNOSIS:  Encounter Diagnosis  Name Primary?   Malignant neoplasm of overlapping sites of right breast in female, estrogen receptor positive (HCC)      CHIEF COMPLIANT: Right-sided breast cancer/ Observation  Establish oncology care with Dr.    INTERVAL HISTORY: Mallory Thomas is a 89 y.o. with the above-mentioned right breast cancer on surveillance. She presents to the clinic for a follow-up. She reports to the clinic that she has been doing great. 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 Denies any pain or discomfort in breast.    ALLERGIES:  is allergic to fish allergy, iodine, olmesartan, shellfish allergy, tape, and zetia [ezetimibe].  MEDICATIONS:  Current Outpatient Medications  Medication Sig Dispense Refill   alendronate (FOSAMAX) 70 MG tablet Take 70 mg by mouth once a week. Take with a full glass of water on an empty stomach.     amLODipine (NORVASC) 10 MG tablet TAKE 1 TABLET BY MOUTH EVERY DAY 90 tablet 3   aspirin 81 MG tablet Take 81 mg by mouth at bedtime.     atorvastatin (LIPITOR) 20 MG tablet Take 20 mg by mouth daily.     dapagliflozin propanediol (FARXIGA) 5 MG TABS tablet 1 tablet Orally Once a day for diabetes     hydrochlorothiazide (MICROZIDE) 12.5 MG capsule Take 1 capsule (12.5 mg total) by mouth daily. 90 capsule 6   No current facility-administered medications for this visit.    PHYSICAL EXAMINATION: ECOG PERFORMANCE STATUS: 1 - Symptomatic but completely  ambulatory  Vitals:   08/27/22 1527  BP: (!) 128/59  Pulse: 89  Resp: 18  Temp: (!) 97.2 F (36.2 C)  SpO2: 98%   Filed Weights   08/27/22 1527  Weight: 152 lb 8 oz (69.2 kg)     BREAST: No palpable masses or nodules in either right or left breasts. No palpable axillary supraclavicular or infraclavicular adenopathy no breast tenderness or nipple discharge. (exam performed in the presence of a chaperone)  LABORATORY DATA:  I have reviewed the data as listed    Latest Ref Rng & Units 08/27/2022    3:21 PM 07/30/2021    1:50 PM 07/30/2020    8:48 AM  CMP  Glucose 70 - 99 mg/dL 264  120  143   BUN 8 - 23 mg/dL 43  38  26   Creatinine 0.44 - 1.00 mg/dL 1.90  1.86  1.08   Sodium 135 - 145 mmol/L 140  141  140   Potassium 3.5 - 5.1 mmol/L 4.5  4.4  4.1   Chloride 98 - 111 mmol/L 108  106  103   CO2 22 - 32 mmol/L 26  23  30   Calcium 8.9 - 10.3 mg/dL 8.7  9.5  9.3   Total Protein 6.5 - 8.1 g/dL 7.4  7.4  6.8   Total Bilirubin 0.3 - 1.2 mg/dL 0.3  0.4  0.5   Alkaline Phos 38 - 126 U/L 55  53  41   AST 15 - 41 U/L 13  14  15   ALT 0 - 44 U/L 6  <  6  <6     Lab Results  Component Value Date   WBC 5.4 08/27/2022   HGB 11.2 (L) 08/27/2022   HCT 34.9 (L) 08/27/2022   MCV 94.6 08/27/2022   PLT 223 08/27/2022   NEUTROABS 3.7 08/27/2022    ASSESSMENT & PLAN:  Malignant neoplasm of overlapping sites of right breast in female, estrogen receptor positive (HCC) Dr. Magrinat patient to establish oncology care 04/2003: Right lumpectomy: 1.2 cm ER/PR positive HER2 negative grade 2 IDC, lymph nodes negative 05/2003-05/2008: MA 27 study: On anastrozole 05/2008: NSABP B42 study placebo versus letrozole  Breast cancer surveillance: 1.  Breast exam 08/27/2022: Benign 2. mammogram: 10/06/2021: Benign breast density category B Bone density 01/08/2022: T score -2.1: Osteopenia  Return to clinic on an as-needed basis  No orders of the defined types were placed in this encounter.  The  patient has a good understanding of the overall plan. she agrees with it. she will call with any problems that may develop before the next visit here. Total time spent: 30 mins including face to face time and time spent for planning, charting and co-ordination of care   Viinay K , MD 08/27/22   I Deritra, Mcnairy am scribing for Dr.   I have reviewed the above documentation for accuracy and completeness, and I agree with the above.   

## 2022-08-26 ENCOUNTER — Other Ambulatory Visit: Payer: Self-pay | Admitting: *Deleted

## 2022-08-26 DIAGNOSIS — Z170 Estrogen receptor positive status [ER+]: Secondary | ICD-10-CM

## 2022-08-27 ENCOUNTER — Inpatient Hospital Stay (HOSPITAL_BASED_OUTPATIENT_CLINIC_OR_DEPARTMENT_OTHER): Payer: Medicare Other | Admitting: Hematology and Oncology

## 2022-08-27 ENCOUNTER — Other Ambulatory Visit: Payer: Self-pay

## 2022-08-27 ENCOUNTER — Inpatient Hospital Stay: Payer: Medicare Other | Attending: Hematology and Oncology

## 2022-08-27 DIAGNOSIS — N183 Chronic kidney disease, stage 3 unspecified: Secondary | ICD-10-CM | POA: Insufficient documentation

## 2022-08-27 DIAGNOSIS — Z87891 Personal history of nicotine dependence: Secondary | ICD-10-CM | POA: Diagnosis not present

## 2022-08-27 DIAGNOSIS — Z7982 Long term (current) use of aspirin: Secondary | ICD-10-CM | POA: Insufficient documentation

## 2022-08-27 DIAGNOSIS — Z853 Personal history of malignant neoplasm of breast: Secondary | ICD-10-CM | POA: Diagnosis present

## 2022-08-27 DIAGNOSIS — E785 Hyperlipidemia, unspecified: Secondary | ICD-10-CM | POA: Insufficient documentation

## 2022-08-27 DIAGNOSIS — I129 Hypertensive chronic kidney disease with stage 1 through stage 4 chronic kidney disease, or unspecified chronic kidney disease: Secondary | ICD-10-CM | POA: Insufficient documentation

## 2022-08-27 DIAGNOSIS — Z952 Presence of prosthetic heart valve: Secondary | ICD-10-CM | POA: Diagnosis not present

## 2022-08-27 DIAGNOSIS — I350 Nonrheumatic aortic (valve) stenosis: Secondary | ICD-10-CM | POA: Diagnosis not present

## 2022-08-27 DIAGNOSIS — C50811 Malignant neoplasm of overlapping sites of right female breast: Secondary | ICD-10-CM | POA: Diagnosis not present

## 2022-08-27 DIAGNOSIS — M8580 Other specified disorders of bone density and structure, unspecified site: Secondary | ICD-10-CM | POA: Insufficient documentation

## 2022-08-27 DIAGNOSIS — Z7984 Long term (current) use of oral hypoglycemic drugs: Secondary | ICD-10-CM | POA: Diagnosis not present

## 2022-08-27 DIAGNOSIS — E1122 Type 2 diabetes mellitus with diabetic chronic kidney disease: Secondary | ICD-10-CM | POA: Insufficient documentation

## 2022-08-27 DIAGNOSIS — Z79899 Other long term (current) drug therapy: Secondary | ICD-10-CM | POA: Diagnosis not present

## 2022-08-27 DIAGNOSIS — Z923 Personal history of irradiation: Secondary | ICD-10-CM | POA: Diagnosis not present

## 2022-08-27 DIAGNOSIS — Z170 Estrogen receptor positive status [ER+]: Secondary | ICD-10-CM

## 2022-08-27 LAB — CMP (CANCER CENTER ONLY)
ALT: 6 U/L (ref 0–44)
AST: 13 U/L — ABNORMAL LOW (ref 15–41)
Albumin: 4.1 g/dL (ref 3.5–5.0)
Alkaline Phosphatase: 55 U/L (ref 38–126)
Anion gap: 6 (ref 5–15)
BUN: 43 mg/dL — ABNORMAL HIGH (ref 8–23)
CO2: 26 mmol/L (ref 22–32)
Calcium: 8.7 mg/dL — ABNORMAL LOW (ref 8.9–10.3)
Chloride: 108 mmol/L (ref 98–111)
Creatinine: 1.9 mg/dL — ABNORMAL HIGH (ref 0.44–1.00)
GFR, Estimated: 25 mL/min — ABNORMAL LOW (ref >60)
Glucose, Bld: 264 mg/dL — ABNORMAL HIGH (ref 70–99)
Potassium: 4.5 mmol/L (ref 3.5–5.1)
Sodium: 140 mmol/L (ref 135–145)
Total Bilirubin: 0.3 mg/dL (ref 0.3–1.2)
Total Protein: 7.4 g/dL (ref 6.5–8.1)

## 2022-08-27 LAB — CBC WITH DIFFERENTIAL (CANCER CENTER ONLY)
Abs Immature Granulocytes: 0.01 10*3/uL (ref 0.00–0.07)
Basophils Absolute: 0 10*3/uL (ref 0.0–0.1)
Basophils Relative: 0 %
Eosinophils Absolute: 0.2 10*3/uL (ref 0.0–0.5)
Eosinophils Relative: 3 %
HCT: 34.9 % — ABNORMAL LOW (ref 36.0–46.0)
Hemoglobin: 11.2 g/dL — ABNORMAL LOW (ref 12.0–15.0)
Immature Granulocytes: 0 %
Lymphocytes Relative: 21 %
Lymphs Abs: 1.1 10*3/uL (ref 0.7–4.0)
MCH: 30.4 pg (ref 26.0–34.0)
MCHC: 32.1 g/dL (ref 30.0–36.0)
MCV: 94.6 fL (ref 80.0–100.0)
Monocytes Absolute: 0.4 10*3/uL (ref 0.1–1.0)
Monocytes Relative: 8 %
Neutro Abs: 3.7 10*3/uL (ref 1.7–7.7)
Neutrophils Relative %: 68 %
Platelet Count: 223 10*3/uL (ref 150–400)
RBC: 3.69 MIL/uL — ABNORMAL LOW (ref 3.87–5.11)
RDW: 14 % (ref 11.5–15.5)
WBC Count: 5.4 10*3/uL (ref 4.0–10.5)
nRBC: 0 % (ref 0.0–0.2)

## 2022-08-27 NOTE — Assessment & Plan Note (Signed)
Dr. Jana Hakim patient to establish oncology care 04/2003: Right lumpectomy: 1.2 cm ER/PR positive HER2 negative grade 2 IDC, lymph nodes negative 05/2003-05/2008: MA 27 study: On anastrozole 05/2008: NSABP B42 study placebo versus letrozole  Breast cancer surveillance: 1.  Breast exam 08/27/2022: Benign 2. mammogram: 10/06/2021: Benign breast density category B Bone density 01/08/2022: T score -2.1: Osteopenia  Return to clinic in 1 year for follow-up

## 2022-09-01 ENCOUNTER — Other Ambulatory Visit: Payer: Self-pay | Admitting: Internal Medicine

## 2022-09-01 DIAGNOSIS — Z1231 Encounter for screening mammogram for malignant neoplasm of breast: Secondary | ICD-10-CM

## 2022-09-30 ENCOUNTER — Encounter: Payer: Self-pay | Admitting: Podiatry

## 2022-09-30 ENCOUNTER — Ambulatory Visit: Payer: Medicare Other | Admitting: Podiatry

## 2022-09-30 DIAGNOSIS — B351 Tinea unguium: Secondary | ICD-10-CM

## 2022-09-30 DIAGNOSIS — M79675 Pain in left toe(s): Secondary | ICD-10-CM

## 2022-09-30 DIAGNOSIS — M79674 Pain in right toe(s): Secondary | ICD-10-CM | POA: Diagnosis not present

## 2022-09-30 NOTE — Progress Notes (Signed)
Subjective:   Patient ID: Mallory Thomas, female   DOB: 88 y.o.   MRN: [id number]   HPI Patient presents with nail disease with discomfort 1-5 both feet and on the right big toenail has developed a lot of discomfort that she wanted checked.  States does not remember injury to the beds that are just impossible for her to cut at this point and patient does not smoke likes to be active   Review of Systems  All other systems reviewed and are negative.       Objective:  Physical Exam Vitals and nursing note reviewed.  Constitutional:      Appearance: She is well-developed.  Pulmonary:     Effort: Pulmonary effort is normal.  Musculoskeletal:        General: Normal range of motion.  Skin:    General: Skin is warm.  Neurological:     Mental Status: She is alert.     Neurovascular status intact muscle strength adequate range of motion within normal limits with incurvated nail borders thick yellow brittle nail borders 1-5 both feet with nailbeds that are dystrophic.  Good digital perfusion well-oriented     Assessment:  Ingrown toenail with mycotic nail component 1-5 both feet painful     Plan:  H&P educated her on deformity and discussed possible permanent nail procedure in future but at this point organ to try conservative and I debrided nailbeds 1-5 both feet which can be done routinely and educated her on this and reappoint to recheck

## 2022-10-20 ENCOUNTER — Ambulatory Visit
Admission: RE | Admit: 2022-10-20 | Discharge: 2022-10-20 | Disposition: A | Discharge status: Home / Self Care | Payer: Medicare Other | Source: Ambulatory Visit | Attending: Internal Medicine | Admitting: Internal Medicine

## 2022-10-20 ENCOUNTER — Ambulatory Visit: Payer: Medicare Other

## 2022-10-20 DIAGNOSIS — Z1231 Encounter for screening mammogram for malignant neoplasm of breast: Secondary | ICD-10-CM

## 2022-11-19 DIAGNOSIS — I739 Peripheral vascular disease, unspecified: Secondary | ICD-10-CM | POA: Diagnosis not present

## 2022-11-19 DIAGNOSIS — Z1339 Encounter for screening examination for other mental health and behavioral disorders: Secondary | ICD-10-CM | POA: Diagnosis not present

## 2022-11-19 DIAGNOSIS — M85852 Other specified disorders of bone density and structure, left thigh: Secondary | ICD-10-CM | POA: Diagnosis not present

## 2022-11-19 DIAGNOSIS — I1310 Hypertensive heart and chronic kidney disease without heart failure, with stage 1 through stage 4 chronic kidney disease, or unspecified chronic kidney disease: Secondary | ICD-10-CM | POA: Diagnosis not present

## 2022-11-19 DIAGNOSIS — N1832 Chronic kidney disease, stage 3b: Secondary | ICD-10-CM | POA: Diagnosis not present

## 2022-11-19 DIAGNOSIS — E785 Hyperlipidemia, unspecified: Secondary | ICD-10-CM | POA: Diagnosis not present

## 2022-11-19 DIAGNOSIS — I2510 Atherosclerotic heart disease of native coronary artery without angina pectoris: Secondary | ICD-10-CM | POA: Diagnosis not present

## 2022-11-19 DIAGNOSIS — Z0000 Encounter for general adult medical examination without abnormal findings: Secondary | ICD-10-CM | POA: Diagnosis not present

## 2022-11-19 DIAGNOSIS — Z1331 Encounter for screening for depression: Secondary | ICD-10-CM | POA: Diagnosis not present

## 2022-11-19 DIAGNOSIS — E1159 Type 2 diabetes mellitus with other circulatory complications: Secondary | ICD-10-CM | POA: Diagnosis not present

## 2023-01-05 ENCOUNTER — Encounter: Payer: Self-pay | Admitting: Podiatry

## 2023-01-05 ENCOUNTER — Ambulatory Visit (INDEPENDENT_AMBULATORY_CARE_PROVIDER_SITE_OTHER): Payer: Medicare HMO | Admitting: Podiatry

## 2023-01-05 DIAGNOSIS — M79674 Pain in right toe(s): Secondary | ICD-10-CM

## 2023-01-05 DIAGNOSIS — B351 Tinea unguium: Secondary | ICD-10-CM

## 2023-01-05 DIAGNOSIS — M79675 Pain in left toe(s): Secondary | ICD-10-CM | POA: Diagnosis not present

## 2023-01-05 DIAGNOSIS — E1159 Type 2 diabetes mellitus with other circulatory complications: Secondary | ICD-10-CM

## 2023-01-05 NOTE — Progress Notes (Signed)
This patient returns to my office for at risk foot care.  This patient requires this care by a professional since this patient will be at risk due to having diabetes , pvd with claudication and CKD. This patient is unable to cut nails herself since the patient cannot reach her nails.These nails are painful walking and wearing shoes.  This patient presents for at risk foot care today.  General Appearance  Alert, conversant and in no acute stress.  Vascular  Dorsalis pedis and posterior tibial  pulses are  weakly palpable  bilaterally.  Capillary return is within normal limits  bilaterally. Temperature is within normal limits  bilaterally.  Neurologic  Senn-Weinstein monofilament wire test within normal limits  bilaterally. Muscle power within normal limits bilaterally.  Nails Thick disfigured discolored nails with subungual debris  from hallux to fifth toes bilaterally. No evidence of bacterial infection or drainage bilaterally.  Orthopedic  No limitations of motion  feet .  No crepitus or effusions noted.  No bony pathology or digital deformities noted.  Skin  normotropic skin with no porokeratosis noted bilaterally.  No signs of infections or ulcers noted.     Onychomycosis  Pain in right toes  Pain in left toes  Consent was obtained for treatment procedures.   Mechanical debridement of nails 1-5  bilaterally performed with a nail nipper.  Filed with dremel without incident.    Return office visit   3 months                   Told patient to return for periodic foot care and evaluation due to potential at risk complications.   Gardiner Barefoot DPM

## 2023-01-08 ENCOUNTER — Other Ambulatory Visit: Payer: Self-pay | Admitting: Cardiology

## 2023-01-08 DIAGNOSIS — I10 Essential (primary) hypertension: Secondary | ICD-10-CM

## 2023-01-19 DIAGNOSIS — Z961 Presence of intraocular lens: Secondary | ICD-10-CM | POA: Diagnosis not present

## 2023-01-19 DIAGNOSIS — H353131 Nonexudative age-related macular degeneration, bilateral, early dry stage: Secondary | ICD-10-CM | POA: Diagnosis not present

## 2023-01-19 DIAGNOSIS — E119 Type 2 diabetes mellitus without complications: Secondary | ICD-10-CM | POA: Diagnosis not present

## 2023-01-21 DIAGNOSIS — Z91040 Latex allergy status: Secondary | ICD-10-CM | POA: Diagnosis not present

## 2023-01-21 DIAGNOSIS — E119 Type 2 diabetes mellitus without complications: Secondary | ICD-10-CM | POA: Diagnosis not present

## 2023-01-21 DIAGNOSIS — Z85828 Personal history of other malignant neoplasm of skin: Secondary | ICD-10-CM | POA: Diagnosis not present

## 2023-01-21 DIAGNOSIS — I10 Essential (primary) hypertension: Secondary | ICD-10-CM | POA: Diagnosis not present

## 2023-01-21 DIAGNOSIS — Z7983 Long term (current) use of bisphosphonates: Secondary | ICD-10-CM | POA: Diagnosis not present

## 2023-01-21 DIAGNOSIS — Z7984 Long term (current) use of oral hypoglycemic drugs: Secondary | ICD-10-CM | POA: Diagnosis not present

## 2023-01-21 DIAGNOSIS — Z91013 Allergy to seafood: Secondary | ICD-10-CM | POA: Diagnosis not present

## 2023-01-21 DIAGNOSIS — M810 Age-related osteoporosis without current pathological fracture: Secondary | ICD-10-CM | POA: Diagnosis not present

## 2023-01-21 DIAGNOSIS — E785 Hyperlipidemia, unspecified: Secondary | ICD-10-CM | POA: Diagnosis not present

## 2023-01-21 DIAGNOSIS — Z87891 Personal history of nicotine dependence: Secondary | ICD-10-CM | POA: Diagnosis not present

## 2023-03-02 DIAGNOSIS — N184 Chronic kidney disease, stage 4 (severe): Secondary | ICD-10-CM | POA: Diagnosis not present

## 2023-03-02 DIAGNOSIS — I2510 Atherosclerotic heart disease of native coronary artery without angina pectoris: Secondary | ICD-10-CM | POA: Diagnosis not present

## 2023-03-02 DIAGNOSIS — I1310 Hypertensive heart and chronic kidney disease without heart failure, with stage 1 through stage 4 chronic kidney disease, or unspecified chronic kidney disease: Secondary | ICD-10-CM | POA: Diagnosis not present

## 2023-03-02 DIAGNOSIS — M85852 Other specified disorders of bone density and structure, left thigh: Secondary | ICD-10-CM | POA: Diagnosis not present

## 2023-03-02 DIAGNOSIS — I739 Peripheral vascular disease, unspecified: Secondary | ICD-10-CM | POA: Diagnosis not present

## 2023-03-02 DIAGNOSIS — E785 Hyperlipidemia, unspecified: Secondary | ICD-10-CM | POA: Diagnosis not present

## 2023-03-02 DIAGNOSIS — G729 Myopathy, unspecified: Secondary | ICD-10-CM | POA: Diagnosis not present

## 2023-03-02 DIAGNOSIS — Z853 Personal history of malignant neoplasm of breast: Secondary | ICD-10-CM | POA: Diagnosis not present

## 2023-03-02 DIAGNOSIS — E1159 Type 2 diabetes mellitus with other circulatory complications: Secondary | ICD-10-CM | POA: Diagnosis not present

## 2023-03-27 ENCOUNTER — Encounter (HOSPITAL_COMMUNITY): Payer: Self-pay

## 2023-03-27 ENCOUNTER — Inpatient Hospital Stay (HOSPITAL_COMMUNITY)
Admission: EM | Admit: 2023-03-27 | Discharge: 2023-03-29 | DRG: 054 | Disposition: A | Discharge status: Hospice | Payer: Medicare HMO | Attending: Family Medicine | Admitting: Family Medicine

## 2023-03-27 ENCOUNTER — Emergency Department (HOSPITAL_COMMUNITY): Payer: Medicare HMO

## 2023-03-27 DIAGNOSIS — Z66 Do not resuscitate: Secondary | ICD-10-CM | POA: Diagnosis not present

## 2023-03-27 DIAGNOSIS — Y92012 Bathroom of single-family (private) house as the place of occurrence of the external cause: Secondary | ICD-10-CM

## 2023-03-27 DIAGNOSIS — C7982 Secondary malignant neoplasm of genital organs: Secondary | ICD-10-CM | POA: Diagnosis present

## 2023-03-27 DIAGNOSIS — Z8249 Family history of ischemic heart disease and other diseases of the circulatory system: Secondary | ICD-10-CM

## 2023-03-27 DIAGNOSIS — I129 Hypertensive chronic kidney disease with stage 1 through stage 4 chronic kidney disease, or unspecified chronic kidney disease: Secondary | ICD-10-CM | POA: Diagnosis not present

## 2023-03-27 DIAGNOSIS — C7931 Secondary malignant neoplasm of brain: Principal | ICD-10-CM | POA: Diagnosis present

## 2023-03-27 DIAGNOSIS — S199XXA Unspecified injury of neck, initial encounter: Secondary | ICD-10-CM | POA: Diagnosis not present

## 2023-03-27 DIAGNOSIS — W19XXXA Unspecified fall, initial encounter: Secondary | ICD-10-CM | POA: Diagnosis not present

## 2023-03-27 DIAGNOSIS — Z952 Presence of prosthetic heart valve: Secondary | ICD-10-CM

## 2023-03-27 DIAGNOSIS — R000 Tachycardia, unspecified: Secondary | ICD-10-CM | POA: Diagnosis not present

## 2023-03-27 DIAGNOSIS — E1122 Type 2 diabetes mellitus with diabetic chronic kidney disease: Secondary | ICD-10-CM | POA: Diagnosis not present

## 2023-03-27 DIAGNOSIS — C775 Secondary and unspecified malignant neoplasm of intrapelvic lymph nodes: Secondary | ICD-10-CM | POA: Diagnosis present

## 2023-03-27 DIAGNOSIS — C7989 Secondary malignant neoplasm of other specified sites: Secondary | ICD-10-CM

## 2023-03-27 DIAGNOSIS — E119 Type 2 diabetes mellitus without complications: Secondary | ICD-10-CM

## 2023-03-27 DIAGNOSIS — C771 Secondary and unspecified malignant neoplasm of intrathoracic lymph nodes: Secondary | ICD-10-CM | POA: Diagnosis present

## 2023-03-27 DIAGNOSIS — I350 Nonrheumatic aortic (valve) stenosis: Secondary | ICD-10-CM | POA: Diagnosis present

## 2023-03-27 DIAGNOSIS — D631 Anemia in chronic kidney disease: Secondary | ICD-10-CM | POA: Diagnosis present

## 2023-03-27 DIAGNOSIS — Z888 Allergy status to other drugs, medicaments and biological substances status: Secondary | ICD-10-CM

## 2023-03-27 DIAGNOSIS — R410 Disorientation, unspecified: Secondary | ICD-10-CM

## 2023-03-27 DIAGNOSIS — T1490XA Injury, unspecified, initial encounter: Secondary | ICD-10-CM | POA: Diagnosis not present

## 2023-03-27 DIAGNOSIS — K920 Hematemesis: Secondary | ICD-10-CM | POA: Diagnosis not present

## 2023-03-27 DIAGNOSIS — Z87891 Personal history of nicotine dependence: Secondary | ICD-10-CM

## 2023-03-27 DIAGNOSIS — Z91048 Other nonmedicinal substance allergy status: Secondary | ICD-10-CM

## 2023-03-27 DIAGNOSIS — I614 Nontraumatic intracerebral hemorrhage in cerebellum: Principal | ICD-10-CM | POA: Diagnosis present

## 2023-03-27 DIAGNOSIS — C7800 Secondary malignant neoplasm of unspecified lung: Secondary | ICD-10-CM | POA: Diagnosis not present

## 2023-03-27 DIAGNOSIS — I10 Essential (primary) hypertension: Secondary | ICD-10-CM | POA: Diagnosis not present

## 2023-03-27 DIAGNOSIS — R739 Hyperglycemia, unspecified: Secondary | ICD-10-CM

## 2023-03-27 DIAGNOSIS — Z7984 Long term (current) use of oral hypoglycemic drugs: Secondary | ICD-10-CM

## 2023-03-27 DIAGNOSIS — Z7983 Long term (current) use of bisphosphonates: Secondary | ICD-10-CM | POA: Diagnosis not present

## 2023-03-27 DIAGNOSIS — I25118 Atherosclerotic heart disease of native coronary artery with other forms of angina pectoris: Secondary | ICD-10-CM | POA: Diagnosis present

## 2023-03-27 DIAGNOSIS — E872 Acidosis, unspecified: Secondary | ICD-10-CM | POA: Diagnosis not present

## 2023-03-27 DIAGNOSIS — R569 Unspecified convulsions: Secondary | ICD-10-CM | POA: Diagnosis not present

## 2023-03-27 DIAGNOSIS — Z515 Encounter for palliative care: Secondary | ICD-10-CM

## 2023-03-27 DIAGNOSIS — Z91013 Allergy to seafood: Secondary | ICD-10-CM

## 2023-03-27 DIAGNOSIS — C787 Secondary malignant neoplasm of liver and intrahepatic bile duct: Secondary | ICD-10-CM | POA: Diagnosis present

## 2023-03-27 DIAGNOSIS — Z789 Other specified health status: Secondary | ICD-10-CM | POA: Diagnosis not present

## 2023-03-27 DIAGNOSIS — Z91041 Radiographic dye allergy status: Secondary | ICD-10-CM

## 2023-03-27 DIAGNOSIS — Z853 Personal history of malignant neoplasm of breast: Secondary | ICD-10-CM

## 2023-03-27 DIAGNOSIS — E1165 Type 2 diabetes mellitus with hyperglycemia: Secondary | ICD-10-CM | POA: Diagnosis present

## 2023-03-27 DIAGNOSIS — C799 Secondary malignant neoplasm of unspecified site: Secondary | ICD-10-CM | POA: Diagnosis present

## 2023-03-27 DIAGNOSIS — D72829 Elevated white blood cell count, unspecified: Secondary | ICD-10-CM | POA: Diagnosis present

## 2023-03-27 DIAGNOSIS — R6510 Systemic inflammatory response syndrome (SIRS) of non-infectious origin without acute organ dysfunction: Secondary | ICD-10-CM | POA: Diagnosis present

## 2023-03-27 DIAGNOSIS — Z953 Presence of xenogenic heart valve: Secondary | ICD-10-CM

## 2023-03-27 DIAGNOSIS — K449 Diaphragmatic hernia without obstruction or gangrene: Secondary | ICD-10-CM | POA: Diagnosis present

## 2023-03-27 DIAGNOSIS — R918 Other nonspecific abnormal finding of lung field: Secondary | ICD-10-CM | POA: Diagnosis not present

## 2023-03-27 DIAGNOSIS — G9349 Other encephalopathy: Secondary | ICD-10-CM | POA: Diagnosis not present

## 2023-03-27 DIAGNOSIS — G936 Cerebral edema: Secondary | ICD-10-CM | POA: Diagnosis present

## 2023-03-27 DIAGNOSIS — R4182 Altered mental status, unspecified: Secondary | ICD-10-CM | POA: Diagnosis not present

## 2023-03-27 DIAGNOSIS — R682 Dry mouth, unspecified: Secondary | ICD-10-CM | POA: Diagnosis present

## 2023-03-27 DIAGNOSIS — N1330 Unspecified hydronephrosis: Secondary | ICD-10-CM | POA: Diagnosis not present

## 2023-03-27 DIAGNOSIS — G9389 Other specified disorders of brain: Secondary | ICD-10-CM | POA: Diagnosis not present

## 2023-03-27 DIAGNOSIS — E785 Hyperlipidemia, unspecified: Secondary | ICD-10-CM | POA: Diagnosis not present

## 2023-03-27 DIAGNOSIS — N184 Chronic kidney disease, stage 4 (severe): Secondary | ICD-10-CM | POA: Diagnosis present

## 2023-03-27 DIAGNOSIS — Z7189 Other specified counseling: Secondary | ICD-10-CM | POA: Diagnosis not present

## 2023-03-27 DIAGNOSIS — Z79899 Other long term (current) drug therapy: Secondary | ICD-10-CM

## 2023-03-27 DIAGNOSIS — Z91040 Latex allergy status: Secondary | ICD-10-CM

## 2023-03-27 DIAGNOSIS — N3289 Other specified disorders of bladder: Secondary | ICD-10-CM | POA: Diagnosis not present

## 2023-03-27 DIAGNOSIS — Z7982 Long term (current) use of aspirin: Secondary | ICD-10-CM

## 2023-03-27 LAB — I-STAT CHEM 8, ED
BUN: 52 mg/dL — ABNORMAL HIGH (ref 8–23)
Calcium, Ion: 1.08 mmol/L — ABNORMAL LOW (ref 1.15–1.40)
Chloride: 106 mmol/L (ref 98–111)
Creatinine, Ser: 1.8 mg/dL — ABNORMAL HIGH (ref 0.44–1.00)
Glucose, Bld: 399 mg/dL — ABNORMAL HIGH (ref 70–99)
HCT: 32 % — ABNORMAL LOW (ref 36.0–46.0)
Hemoglobin: 10.9 g/dL — ABNORMAL LOW (ref 12.0–15.0)
Potassium: 4.3 mmol/L (ref 3.5–5.1)
Sodium: 138 mmol/L (ref 135–145)
TCO2: 21 mmol/L — ABNORMAL LOW (ref 22–32)

## 2023-03-27 LAB — COMPREHENSIVE METABOLIC PANEL
ALT: 28 U/L (ref 0–44)
AST: 50 U/L — ABNORMAL HIGH (ref 15–41)
Albumin: 2.8 g/dL — ABNORMAL LOW (ref 3.5–5.0)
Alkaline Phosphatase: 162 U/L — ABNORMAL HIGH (ref 38–126)
Anion gap: 17 — ABNORMAL HIGH (ref 5–15)
BUN: 57 mg/dL — ABNORMAL HIGH (ref 8–23)
CO2: 19 mmol/L — ABNORMAL LOW (ref 22–32)
Calcium: 8.5 mg/dL — ABNORMAL LOW (ref 8.9–10.3)
Chloride: 102 mmol/L (ref 98–111)
Creatinine, Ser: 1.88 mg/dL — ABNORMAL HIGH (ref 0.44–1.00)
GFR, Estimated: 25 mL/min — ABNORMAL LOW (ref >60)
Glucose, Bld: 386 mg/dL — ABNORMAL HIGH (ref 70–99)
Potassium: 4.8 mmol/L (ref 3.5–5.1)
Sodium: 138 mmol/L (ref 135–145)
Total Bilirubin: 1 mg/dL (ref 0.3–1.2)
Total Protein: 7.2 g/dL (ref 6.5–8.1)

## 2023-03-27 LAB — CBC WITH DIFFERENTIAL/PLATELET
Abs Immature Granulocytes: 0.12 10*3/uL — ABNORMAL HIGH (ref 0.00–0.07)
Basophils Absolute: 0 10*3/uL (ref 0.0–0.1)
Basophils Relative: 0 %
Eosinophils Absolute: 0 10*3/uL (ref 0.0–0.5)
Eosinophils Relative: 0 %
HCT: 31.7 % — ABNORMAL LOW (ref 36.0–46.0)
Hemoglobin: 9.7 g/dL — ABNORMAL LOW (ref 12.0–15.0)
Immature Granulocytes: 1 %
Lymphocytes Relative: 3 %
Lymphs Abs: 0.4 10*3/uL — ABNORMAL LOW (ref 0.7–4.0)
MCH: 26.1 pg (ref 26.0–34.0)
MCHC: 30.6 g/dL (ref 30.0–36.0)
MCV: 85.4 fL (ref 80.0–100.0)
Monocytes Absolute: 0.8 10*3/uL (ref 0.1–1.0)
Monocytes Relative: 6 %
Neutro Abs: 13.3 10*3/uL — ABNORMAL HIGH (ref 1.7–7.7)
Neutrophils Relative %: 90 %
Platelets: 384 10*3/uL (ref 150–400)
RBC: 3.71 MIL/uL — ABNORMAL LOW (ref 3.87–5.11)
RDW: 15.9 % — ABNORMAL HIGH (ref 11.5–15.5)
WBC: 14.6 10*3/uL — ABNORMAL HIGH (ref 4.0–10.5)
nRBC: 0 % (ref 0.0–0.2)

## 2023-03-27 LAB — CK: Total CK: 681 U/L — ABNORMAL HIGH (ref 38–234)

## 2023-03-27 LAB — LACTIC ACID, PLASMA
Lactic Acid, Venous: 3.5 mmol/L (ref 0.5–1.9)
Lactic Acid, Venous: 2.1 mmol/L (ref 0.5–1.9)

## 2023-03-27 LAB — I-STAT VENOUS BLOOD GAS, ED
Acid-base deficit: 4 mmol/L — ABNORMAL HIGH (ref 0.0–2.0)
Bicarbonate: 20.7 mmol/L (ref 20.0–28.0)
Calcium, Ion: 1.09 mmol/L — ABNORMAL LOW (ref 1.15–1.40)
HCT: 32 % — ABNORMAL LOW (ref 36.0–46.0)
Hemoglobin: 10.9 g/dL — ABNORMAL LOW (ref 12.0–15.0)
O2 Saturation: 91 %
Potassium: 4.3 mmol/L (ref 3.5–5.1)
Sodium: 137 mmol/L (ref 135–145)
TCO2: 22 mmol/L (ref 22–32)
pCO2, Ven: 34.4 mmHg — ABNORMAL LOW (ref 44–60)
pH, Ven: 7.386 (ref 7.25–7.43)
pO2, Ven: 61 mmHg — ABNORMAL HIGH (ref 32–45)

## 2023-03-27 LAB — URINALYSIS, W/ REFLEX TO CULTURE (INFECTION SUSPECTED)
Bacteria, UA: NONE SEEN
Bilirubin Urine: NEGATIVE
Glucose, UA: >=500 mg/dL — AB
Ketones, ur: NEGATIVE mg/dL
Leukocytes,Ua: NEGATIVE
Nitrite: NEGATIVE
Protein, ur: 30 mg/dL — AB
Specific Gravity, Urine: 1.017 (ref 1.005–1.030)
pH: 5 (ref 5.0–8.0)

## 2023-03-27 LAB — OSMOLALITY: Osmolality: 324 mOsm/kg (ref 275–295)

## 2023-03-27 LAB — TROPONIN I (HIGH SENSITIVITY)
Troponin I (High Sensitivity): 43 ng/L — ABNORMAL HIGH (ref <18)
Troponin I (High Sensitivity): 55 ng/L — ABNORMAL HIGH (ref <18)

## 2023-03-27 LAB — TSH: TSH: 1.372 u[IU]/mL (ref 0.350–4.500)

## 2023-03-27 LAB — PROTIME-INR
INR: 1.2 (ref 0.8–1.2)
Prothrombin Time: 15 seconds (ref 11.4–15.2)

## 2023-03-27 LAB — AMMONIA: Ammonia: <10 umol/L (ref 9–35)

## 2023-03-27 LAB — MAGNESIUM: Magnesium: 1.9 mg/dL (ref 1.7–2.4)

## 2023-03-27 LAB — TYPE AND SCREEN
ABO/RH(D): A POS
Antibody Screen: NEGATIVE

## 2023-03-27 LAB — POC OCCULT BLOOD, ED: Fecal Occult Bld: NEGATIVE

## 2023-03-27 LAB — BETA-HYDROXYBUTYRIC ACID: Beta-Hydroxybutyric Acid: 0.74 mmol/L — ABNORMAL HIGH (ref 0.05–0.27)

## 2023-03-27 MED ORDER — ACETAMINOPHEN 325 MG PO TABS: 650.0000 mg | ORAL_TABLET | Freq: Four times a day (QID) | ORAL | Status: DC | PRN

## 2023-03-27 MED ORDER — DEXAMETHASONE SODIUM PHOSPHATE 10 MG/ML IJ SOLN
10.0000 mg | Freq: Once | INTRAMUSCULAR | Status: AC
  Administered 2023-03-27: 10 mg via INTRAVENOUS
  Filled 2023-03-27: qty 1

## 2023-03-27 MED ORDER — TRAZODONE HCL 50 MG PO TABS: 25.0000 mg | ORAL_TABLET | Freq: Every evening | ORAL | Status: DC | PRN

## 2023-03-27 MED ORDER — GLYCOPYRROLATE 1 MG PO TABS: 1.0000 mg | ORAL_TABLET | ORAL | Status: DC | PRN

## 2023-03-27 MED ORDER — LACTATED RINGERS IV SOLN: INTRAVENOUS | Status: DC

## 2023-03-27 MED ORDER — VANCOMYCIN HCL 1500 MG/300ML IV SOLN
1500.0000 mg | Freq: Once | INTRAVENOUS | Status: DC
  Filled 2023-03-27: qty 300

## 2023-03-27 MED ORDER — SODIUM CHLORIDE 0.9 % IV SOLN
2.0000 g | Freq: Once | INTRAVENOUS | Status: AC
  Administered 2023-03-27: 2 g via INTRAVENOUS
  Filled 2023-03-27: qty 12.5

## 2023-03-27 MED ORDER — LACTATED RINGERS IV BOLUS (SEPSIS)
1000.0000 mL | Freq: Once | INTRAVENOUS | Status: AC
  Administered 2023-03-27: 1000 mL via INTRAVENOUS

## 2023-03-27 MED ORDER — HALOPERIDOL LACTATE 2 MG/ML PO CONC: 0.5000 mg | ORAL | Status: DC | PRN

## 2023-03-27 MED ORDER — HALOPERIDOL LACTATE 5 MG/ML IJ SOLN: 0.5000 mg | INTRAMUSCULAR | Status: DC | PRN

## 2023-03-27 MED ORDER — LORAZEPAM 2 MG/ML PO CONC: 1.0000 mg | ORAL | Status: DC | PRN

## 2023-03-27 MED ORDER — VANCOMYCIN HCL 1250 MG/250ML IV SOLN
1250.0000 mg | Freq: Once | INTRAVENOUS | Status: DC
  Filled 2023-03-27: qty 250

## 2023-03-27 MED ORDER — SODIUM CHLORIDE 0.9 % IV SOLN: 2.0000 g | INTRAVENOUS | Status: DC

## 2023-03-27 MED ORDER — BIOTENE DRY MOUTH MT LIQD: 15.0000 mL | OROMUCOSAL | Status: DC | PRN

## 2023-03-27 MED ORDER — ONDANSETRON 4 MG PO TBDP: 4.0000 mg | ORAL_TABLET | Freq: Four times a day (QID) | ORAL | Status: DC | PRN

## 2023-03-27 MED ORDER — LEVETIRACETAM 500 MG PO TABS: 500.0000 mg | ORAL_TABLET | Freq: Two times a day (BID) | ORAL | Status: DC

## 2023-03-27 MED ORDER — OXYCODONE HCL 5 MG PO TABS: 5.0000 mg | ORAL_TABLET | ORAL | Status: DC | PRN

## 2023-03-27 MED ORDER — ONDANSETRON HCL 4 MG/2ML IJ SOLN
4.0000 mg | Freq: Once | INTRAMUSCULAR | Status: AC
  Administered 2023-03-27: 4 mg via INTRAVENOUS
  Filled 2023-03-27: qty 2

## 2023-03-27 MED ORDER — ACETAMINOPHEN 650 MG RE SUPP: 650.0000 mg | Freq: Four times a day (QID) | RECTAL | Status: DC | PRN

## 2023-03-27 MED ORDER — ONDANSETRON HCL 4 MG/2ML IJ SOLN: 4.0000 mg | Freq: Four times a day (QID) | INTRAMUSCULAR | Status: DC | PRN

## 2023-03-27 MED ORDER — VANCOMYCIN HCL IN DEXTROSE 1-5 GM/200ML-% IV SOLN: 1000.0000 mg | INTRAVENOUS | Status: DC

## 2023-03-27 MED ORDER — HYDROMORPHONE HCL 1 MG/ML IJ SOLN: 0.5000 mg | INTRAMUSCULAR | Status: DC | PRN

## 2023-03-27 MED ORDER — GADOBUTROL 1 MMOL/ML IV SOLN
7.0000 mL | Freq: Once | INTRAVENOUS | Status: AC | PRN
  Administered 2023-03-27: 7 mL via INTRAVENOUS

## 2023-03-27 MED ORDER — GLYCOPYRROLATE 0.2 MG/ML IJ SOLN: 0.2000 mg | INTRAMUSCULAR | Status: DC | PRN

## 2023-03-27 MED ORDER — HALOPERIDOL 0.5 MG PO TABS: 0.5000 mg | ORAL_TABLET | ORAL | Status: DC | PRN

## 2023-03-27 MED ORDER — LORAZEPAM 1 MG PO TABS: 1.0000 mg | ORAL_TABLET | ORAL | Status: DC | PRN

## 2023-03-27 MED ORDER — CHLORPROMAZINE HCL 25 MG PO TABS: 25.0000 mg | ORAL_TABLET | Freq: Four times a day (QID) | ORAL | Status: DC | PRN

## 2023-03-27 MED ORDER — DIPHENHYDRAMINE HCL 50 MG/ML IJ SOLN: 12.5000 mg | INTRAMUSCULAR | Status: DC | PRN

## 2023-03-27 MED ORDER — GLYCOPYRROLATE 0.2 MG/ML IJ SOLN
0.2000 mg | INTRAMUSCULAR | Status: DC | PRN
  Administered 2023-03-28: 0.2 mg via INTRAVENOUS
  Filled 2023-03-27: qty 1

## 2023-03-27 MED ORDER — DEXAMETHASONE SODIUM PHOSPHATE 4 MG/ML IJ SOLN
4.0000 mg | Freq: Four times a day (QID) | INTRAMUSCULAR | Status: DC
  Administered 2023-03-28 (×3): 4 mg via INTRAVENOUS
  Filled 2023-03-27 (×8): qty 1

## 2023-03-27 MED ORDER — VANCOMYCIN HCL IN DEXTROSE 1-5 GM/200ML-% IV SOLN: 1000.0000 mg | Freq: Once | INTRAVENOUS | Status: DC

## 2023-03-27 MED ORDER — LACTATED RINGERS IV BOLUS (SEPSIS): 250.0000 mL | Freq: Once | INTRAVENOUS | Status: DC

## 2023-03-27 MED ORDER — METRONIDAZOLE 500 MG/100ML IV SOLN
500.0000 mg | Freq: Once | INTRAVENOUS | Status: DC
  Filled 2023-03-27: qty 100

## 2023-03-27 MED ORDER — LEVETIRACETAM IN NACL 1500 MG/100ML IV SOLN
1500.0000 mg | Freq: Once | INTRAVENOUS | Status: AC
  Administered 2023-03-27: 1500 mg via INTRAVENOUS
  Filled 2023-03-27: qty 100

## 2023-03-27 MED ORDER — POLYVINYL ALCOHOL 1.4 % OP SOLN: 1.0000 [drp] | Freq: Four times a day (QID) | OPHTHALMIC | Status: DC | PRN

## 2023-03-27 MED ORDER — LORAZEPAM 2 MG/ML IJ SOLN
1.0000 mg | INTRAMUSCULAR | Status: DC | PRN
  Administered 2023-03-28: 1 mg via INTRAVENOUS
  Filled 2023-03-27: qty 1

## 2023-03-27 NOTE — ED Notes (Signed)
IV team and EEG at bedside, family waiting in the hall.

## 2023-03-27 NOTE — H&P (Signed)
History and Physical    Mallory Thomas WUJ:[id number] DOB: 10-25-1932 DOA: 03/27/2023  PCP: Melida Quitter, MD  Patient coming from: Home  I have personally briefly reviewed patient's old medical records in [facility]  Chief Complaint: Found down at home, coffee-ground emesis  HPI: Mallory Thomas is a 89 y.o. female with medical history significant for CKD stage IV, AAS s/p TAVR, T2DM, HTN, HLD, PAD s/p right iliac stent 2017, CAD, right breast cancer in remission who presented to the ED for evaluation after she was found down at home.  History is supplemented by family at bedside as she does not remember the events.  Patient was in her usual state of health yesterday.  She had lunch with family at a restaurant.  She has been fairly active physically and socially recently.  She mentioned not feeling like herself recently but no specific complaint and planned to see her doctor next week. She was last seen well at 5:30 PM yesterday.  Last night she stayed at a friend's house and oxidant while they were out of town.  Family tried to contact patient today but did not get an answer.  She was found down on the bathroom floor with coffee-ground emesis and dark stool when owner of the home returned.  She was brought to the ED for further evaluation.  Patient is currently resting in bed.  She appears comfortable and undergoing EEG.  She does not recall what happened today.  She denies any pain, headache, change in vision, chest pain, shortness of breath.  ED Course  Labs/Imaging on admission: I have personally reviewed following labs and imaging studies.  Initial eval showed BP 171/72, pulse 105, RR 20, temp 96.1 F rectally.  SpO2 100% on room air.  Labs show WBC 14.6, hemoglobin 9.7, platelets 384,000, sodium 138, potassium 4.8, bicarb 19, BUN 57, creatinine 1.88, serum glucose 386, AST 50 and ALT 28 (hemolysis may be present), alk phos 162, T. bili 1.0, troponin 43, CK6 181,  osmolality 324, ammonia <10, beta hydroxybutyrate 0.74, bicarb 19, anion gap 17, lactic acid 3.5.  Blood cultures in process.  FOBT negative.  UA negative for UTI.  Portable chest x-ray showed multiple bilateral pulmonary nodules and masses suspicious for metastatic disease.  CT head and cervical spine showed findings suggestive of metastatic malignancy involving the lungs and brain.  Bilateral lateral cerebral and cerebellar masses/metastases with superimposed hemorrhage not excluded.  Question right cerebellar SAH and right frontal calvarial convexity mass versus SAH.  No acute displaced fracture or traumatic listhesis of the C-spine seen.  CT chest/abdomen/pelvis showed left adnexal/left pelvic sidewall mass worrisome for primary ovarian neoplasm causing moderate left-sided hydroureteronephrosis.  Multiple pulmonary nodules and masses, mediastinal and left hilar lymphadenopathy, and hepatic lesions all concerning for metastatic disease.  Large hiatal hernia noted.  Patient was given IV vancomycin, cefepime, Flagyl, 2 L LR.  EDP discussed with on-call neurology who recommended Keppra, Decadron, and an MRI.  Patient was given IV Keppra 1500 mg, IV Decadron 10 mg to be followed by 4 mg every 6 hours.  MRI brain obtained, result pending.  The hospitalist service was consulted to admit for further evaluation and management.  Review of Systems:  All systems reviewed and are negative except as documented in history of present illness above.   Past Medical History:  Diagnosis Date   Aortic stenosis    Breast cancer (HCC) 2004   right   Diabetes mellitus    Heart murmur  Hyperlipidemia    Hypertension    S/P TAVR (transcatheter aortic valve replacement) 02/15/2018   23 mm Edwards Sapien 3 transcatheter heart valve placed via percutaneous right transfemoral approach     Past Surgical History:  Procedure Laterality Date   ABDOMINAL AORTOGRAM N/A 11/22/2018   Procedure: ABDOMINAL AORTOGRAM;   Surgeon: Yates Decamp, MD;  Location: [facility] INVASIVE CV LAB;  Service: Cardiovascular;  Laterality: N/A;   BILATERAL KNEE ARTHROSCOPY     BREAST LUMPECTOMY Right 2004   radiation    CARDIAC CATHETERIZATION     12/24/17   CORONARY ULTRASOUND/IVUS N/A 12/24/2017   Procedure: Intravascular Ultrasound/IVUS;  Surgeon: Yates Decamp, MD;  Location: [facility] INVASIVE CV LAB;  Service: Cardiovascular;  Laterality: N/A;   LOWER EXTREMITY ANGIOGRAPHY Bilateral 11/22/2018   Procedure: LOWER EXTREMITY ANGIOGRAPHY;  Surgeon: Yates Decamp, MD;  Location: [facility] INVASIVE CV LAB;  Service: Cardiovascular;  Laterality: Bilateral;   lymph node removal right arm     PERIPHERAL VASCULAR CATHETERIZATION N/A 11/10/2016   Procedure: Lower Extremity Angiography;  Surgeon: Yates Decamp, MD;  Location: [facility] INVASIVE CV LAB;  Service: Cardiovascular;  Laterality: N/A;   PERIPHERAL VASCULAR CATHETERIZATION Right 11/10/2016   Procedure: Peripheral Vascular Intervention;  Surgeon: Yates Decamp, MD;  Location: [facility] INVASIVE CV LAB;  Service: Cardiovascular;  Laterality: Right;  Rt Com Iliac   TEE WITHOUT CARDIOVERSION N/A 02/15/2018   Procedure: TRANSESOPHAGEAL ECHOCARDIOGRAM (TEE);  Surgeon: Tonny Bollman, MD;  Location: [facility] OR;  Service: Open Heart Surgery;  Laterality: N/A;   TONSILLECTOMY     TRANSCATHETER AORTIC VALVE REPLACEMENT, TRANSFEMORAL N/A 02/15/2018   Procedure: TRANSCATHETER AORTIC VALVE REPLACEMENT, TRANSFEMORAL;  Surgeon: Tonny Bollman, MD;  Location: [facility] OR;  Service: Open Heart Surgery;  Laterality: N/A;    Social History:  reports that she quit smoking about 54 years ago. Her smoking use included cigarettes. She has a 15.00 pack-year smoking history. She has never used smokeless tobacco. She reports current alcohol use. She reports that she does not use drugs.  Allergies  Allergen Reactions   Fish Allergy Hives   Iodine Hives    Allergic to ALL seafood, shrimp, crab etc.   Olmesartan Other (See Comments)    Hyperkalemia   Shellfish  Allergy Hives   Latex Hives   Tape Hives   Zetia [Ezetimibe] Diarrhea    Family History  Problem Relation Age of Onset   Heart attack Mother    Heart attack Father    Breast cancer Neg Hx      Prior to Admission medications   Medication Sig Start Date End Date Taking? Authorizing Provider  alendronate (FOSAMAX) 70 MG tablet Take 70 mg by mouth once a week. Take with a full glass of water on an empty stomach.    [provider]  amLODipine (NORVASC) 10 MG tablet TAKE 1 TABLET BY MOUTH EVERY DAY 01/08/23   Yates Decamp, MD  aspirin 81 MG tablet Take 81 mg by mouth at bedtime.    [provider]  atorvastatin (LIPITOR) 20 MG tablet Take 20 mg by mouth daily.    [provider]  dapagliflozin propanediol (FARXIGA) 5 MG TABS tablet 1 tablet Orally Once a day for diabetes 06/12/22   [provider]  hydrochlorothiazide (MICROZIDE) 12.5 MG capsule Take 1 capsule (12.5 mg total) by mouth daily. 07/07/18   Magrinat, Valentino Hue, MD    Physical Exam: Vitals:   03/27/23 1750 03/27/23 1830 03/27/23 1900 03/27/23 2230  BP: (!) 171/72 (!) 164/79 (!) 152/64  Pulse: (!) 105 (!) 102  96  Resp: 20 20 18    Temp: (!) 96.1 F (35.6 C) (!) 97.1 F (36.2 C) (!) 97.5 F (36.4 C) 98.4 F (36.9 C)  TempSrc: Rectal     SpO2: 100% 100%  100%   Constitutional: Resting in bed, NAD, calm, comfortable.  EEG in process. Eyes: EOMI, lids and conjunctivae normal ENMT: Mucous membranes are moist. Posterior pharynx clear of any exudate or lesions.Normal dentition.  Neck: normal, supple, no masses. Respiratory: clear to auscultation anteriorly. Normal respiratory effort. No accessory muscle use.  Cardiovascular: Regular rate and rhythm, no murmurs / rubs / gallops. No extremity edema. 2+ pedal pulses. Abdomen: no tenderness, no masses palpated.  Musculoskeletal: no clubbing / cyanosis. No joint deformity upper and lower extremities. Good ROM, no contractures. Normal muscle tone.   Skin: no rashes, lesions, ulcers. No induration Neurologic: Sensation intact. Strength equal bilaterally. Psychiatric: Alert and oriented to person but not place or situation.  EKG: Personally reviewed. Sinus tachycardia, rate 106, motion artifact present.  Rate is faster when compared to prior.  Assessment/Plan Principal Problem:   Metastatic disease (HCC) Active Problems:   Type 2 diabetes mellitus (HCC)   Coronary artery disease involving native coronary artery with other form of angina pectoris, unspecified whether native or transplanted heart (HCC)   S/P TAVR (transcatheter aortic valve replacement)   Comfort measures only status   CKD (chronic kidney disease), stage IV (HCC)   Mallory Thomas is a 89 y.o. female with medical history significant for CKD stage IV, AAS s/p TAVR, T2DM, HTN, HLD, PAD s/p right iliac stent 2017, CAD, right breast cancer in remission who presented after she was found down with coffee-ground emesis.  Workup revealing for widespread metastatic appearing disease.  Family have made decision to transition to comfort care measures. *** Assessment and Plan: Metastatic disease of unknown primary involving brain, lungs, liver, left adnexa/pelvic sidewall, and lymph nodes: ***  SIRS: ***  Coffee-ground emesis: ***  Type 2 diabetes with hyperglycemia: ***  CKD stage IV: ***  Normocytic anemia: ***  CAD/PAD: ***   DVT prophylaxis: None Code Status: DNR, comfort measures only.  Confirmed with patient and two daughters at bedside on admission. Family Communication: Daughters Child psychotherapist at bedside Disposition Plan: Anticipate discharge to inpatient versus home hospice Consults called: EDP discussed with neurology Severity of Illness: The appropriate patient status for this patient is INPATIENT. Inpatient status is judged to be reasonable and necessary in order to provide the required intensity of service to ensure the patient's safety. The  patient's presenting symptoms, physical exam findings, and initial radiographic and laboratory data in the context of their chronic comorbidities is felt to place them at high risk for further clinical deterioration. Furthermore, it is not anticipated that the patient will be medically stable for discharge from the hospital within 2 midnights of admission.   * I certify that at the point of admission it is my clinical judgment that the patient will require inpatient hospital care spanning beyond 2 midnights from the point of admission due to high intensity of service, high risk for further deterioration and high frequency of surveillance required.Darreld Mclean MD [facility]  If 7PM-7AM, please contact night-coverage [email or web address]  03/27/2023, 11:19 PM

## 2023-03-27 NOTE — ED Notes (Signed)
Per provider Allena Katz at bedside, plan of care is transitioning to comfort care and we will not continue to administer antibiotics.

## 2023-03-27 NOTE — Progress Notes (Signed)
Pharmacy Antibiotic Note  Mallory Thomas is a 89 y.o. female for which pharmacy has been consulted for cefepime and vancomycin dosing for sepsis.  Patient with a history of CKD, breast cancer, HTN, DM, HLD, CAD, aortic stenosis s/p TAVR. Patient presenting with AMS.  SCr 1.8 - near baseline WBC 14.6; LA 3.5; T 97.5; HR 102; RR 18  Plan: Metronidazole per MD Cefepime 2g q24hr  Vancomycin 1250 mg once then 1000 mg q48hr (eAUC 548) unless change in renal function Trend WBC, Fever, Renal function F/u cultures, clinical course, WBC De-escalate when able     Temp (24hrs), Avg:96.9 F (36.1 C), Min:96.1 F (35.6 C), Max:97.5 F (36.4 C)  Recent Labs  Lab 03/27/23 1731 03/27/23 1844  WBC 14.6*  --   CREATININE 1.88* 1.80*  LATICACIDVEN 3.5*  --     CrCl cannot be calculated (Unknown ideal weight.).    Allergies  Allergen Reactions   Fish Allergy Hives   Iodine Hives    Allergic to ALL seafood, shrimp, crab etc.   Olmesartan Other (See Comments)    Hyperkalemia   Shellfish Allergy Hives   Tape Hives   Zetia [Ezetimibe] Diarrhea   Microbiology results: Pending  Thank you for allowing pharmacy to be a part of this patient's care.  Delmar Landau, PharmD, BCPS 03/27/2023 7:52 PM ED Clinical Pharmacist -  [phone]

## 2023-03-27 NOTE — ED Notes (Signed)
Family at bedside with patient. Patient is comfortable and talking at this time. No distress noted.

## 2023-03-27 NOTE — ED Notes (Signed)
Patient's IV sites are functional but positional, IV team consult in place for better IV access.

## 2023-03-27 NOTE — Hospital Course (Signed)
Mallory Thomas is a 89 y.o. female with medical history significant for CKD stage IV, AAS s/p TAVR, T2DM, HTN, HLD, PAD s/p right iliac stent 2017, CAD, right breast cancer in remission who presented after she was found down with coffee-ground emesis.  Workup revealing for widespread metastatic appearing disease.  Family have made decision to transition to comfort care measures.

## 2023-03-27 NOTE — Progress Notes (Signed)
EEG complete - results pending 

## 2023-03-27 NOTE — ED Provider Notes (Signed)
[place] EMERGENCY DEPARTMENT AT [facility] Provider Note   CSN: [id number] Arrival date & time: 03/27/23  1722     History  Chief Complaint  Patient presents with   Altered Mental Status    Mallory Thomas is a 89 y.o. female.   Altered Mental Status Patient presents for altered mental status.  Medical history includes CKD, breast cancer, HTN, DM, HLD, CAD, aortic stenosis s/p TAVR.  She lives independently.  Last known well was yesterday afternoon.  Patient was found on the floor of her bathroom this evening.  EMS noted dark-colored emesis, melena, tachycardia, and hyperglycemia with blood sugar in the high 500s.  Patient is disoriented to time and president.  Patient currently denies any areas of pain.  History per daughter: Patient was last seen well at 5:30 PM yesterday.  Last night, she was staying at a friend's house to dog sit while they were out of town.  His family tried to contact the patient today, they were not able to get an answer.  Owner of the home ultimately came back to check and that is when patient was found on the bathroom floor.  There was evidence of damage to the toilet seat suggesting a fall.  Bed did look like it had been slept in which suggests a fall sometime late night or this morning.  Patient did not bring her medications and may not be taking her medications currently.  She did recently call her PCP to schedule appointment because she was not feeling well.     Home Medications Prior to Admission medications   Medication Sig Start Date End Date Taking? Authorizing Provider  alendronate (FOSAMAX) 70 MG tablet Take 70 mg by mouth once a week. Take with a full glass of water on an empty stomach.    [provider]  amLODipine (NORVASC) 10 MG tablet TAKE 1 TABLET BY MOUTH EVERY DAY 01/08/23   Yates Decamp, MD  aspirin 81 MG tablet Take 81 mg by mouth at bedtime.    [provider]  atorvastatin (LIPITOR) 20 MG tablet Take 20  mg by mouth daily.    [provider]  dapagliflozin propanediol (FARXIGA) 5 MG TABS tablet 1 tablet Orally Once a day for diabetes 06/12/22   [provider]  hydrochlorothiazide (MICROZIDE) 12.5 MG capsule Take 1 capsule (12.5 mg total) by mouth daily. 07/07/18   Magrinat, Valentino Hue, MD      Allergies    Fish allergy, Iodine, Olmesartan, Shellfish allergy, Tape, and Zetia [ezetimibe]    Review of Systems   Review of Systems  Unable to perform ROS: Mental status change    Physical Exam Updated Vital Signs BP (!) 152/64   Pulse (!) 102   Temp (!) 97.5 F (36.4 C)   Resp 18   SpO2 100%  Physical Exam Vitals and nursing note reviewed.  Constitutional:      General: She is not in acute distress.    Appearance: She is well-developed. She is ill-appearing. She is not toxic-appearing or diaphoretic.  HENT:     Head: Normocephalic and atraumatic.     Right Ear: External ear normal.     Left Ear: External ear normal.     Nose: Nose normal.     Mouth/Throat:     Mouth: Mucous membranes are moist.  Eyes:     Extraocular Movements: Extraocular movements intact.     Conjunctiva/sclera: Conjunctivae normal.  Cardiovascular:     Rate and Rhythm:  Regular rhythm. Tachycardia present.     Heart sounds: No murmur heard. Pulmonary:     Effort: Pulmonary effort is normal. No respiratory distress.     Breath sounds: Normal breath sounds. No wheezing, rhonchi or rales.  Chest:     Chest wall: No tenderness.  Abdominal:     General: There is distension.     Palpations: Abdomen is soft.     Tenderness: There is no abdominal tenderness.  Musculoskeletal:        General: No swelling. Normal range of motion.     Cervical back: Normal range of motion and neck supple.     Right lower leg: No edema.     Left lower leg: No edema.  Skin:    General: Skin is warm and dry.     Capillary Refill: Capillary refill takes less than 2 seconds.     Findings: Bruising present.   Neurological:     General: No focal deficit present.     Mental Status: She is alert. She is disoriented.  Psychiatric:        Mood and Affect: Mood normal.        Behavior: Behavior normal.     ED Results / Procedures / Treatments   Labs (all labs ordered are listed, but only abnormal results are displayed) Labs Reviewed  LACTIC ACID, PLASMA - Abnormal; Notable for the following components:      Result Value   Lactic Acid, Venous 3.5 (*)    All other components within normal limits  COMPREHENSIVE METABOLIC PANEL - Abnormal; Notable for the following components:   CO2 19 (*)    Glucose, Bld 386 (*)    BUN 57 (*)    Creatinine, Ser 1.88 (*)    Calcium 8.5 (*)    Albumin 2.8 (*)    AST 50 (*)    Alkaline Phosphatase 162 (*)    GFR, Estimated 25 (*)    Anion gap 17 (*)    All other components within normal limits  CBC WITH DIFFERENTIAL/PLATELET - Abnormal; Notable for the following components:   WBC 14.6 (*)    RBC 3.71 (*)    Hemoglobin 9.7 (*)    HCT 31.7 (*)    RDW 15.9 (*)    Neutro Abs 13.3 (*)    Lymphs Abs 0.4 (*)    Abs Immature Granulocytes 0.12 (*)    All other components within normal limits  URINALYSIS, W/ REFLEX TO CULTURE (INFECTION SUSPECTED) - Abnormal; Notable for the following components:   Glucose, UA >=500 (*)    Hgb urine dipstick MODERATE (*)    Protein, ur 30 (*)    All other components within normal limits  CK - Abnormal; Notable for the following components:   Total CK 681 (*)    All other components within normal limits  BETA-HYDROXYBUTYRIC ACID - Abnormal; Notable for the following components:   Beta-Hydroxybutyric Acid 0.74 (*)    All other components within normal limits  OSMOLALITY - Abnormal; Notable for the following components:   Osmolality 324 (*)    All other components within normal limits  I-STAT CHEM 8, ED - Abnormal; Notable for the following components:   BUN 52 (*)    Creatinine, Ser 1.80 (*)    Glucose, Bld 399 (*)     Calcium, Ion 1.08 (*)    TCO2 21 (*)    Hemoglobin 10.9 (*)    HCT 32.0 (*)    All other components within  normal limits  I-STAT VENOUS BLOOD GAS, ED - Abnormal; Notable for the following components:   pCO2, Ven 34.4 (*)    pO2, Ven 61 (*)    Acid-base deficit 4.0 (*)    Calcium, Ion 1.09 (*)    HCT 32.0 (*)    Hemoglobin 10.9 (*)    All other components within normal limits  TROPONIN I (HIGH SENSITIVITY) - Abnormal; Notable for the following components:   Troponin I (High Sensitivity) 43 (*)    All other components within normal limits  CULTURE, BLOOD (ROUTINE X 2)  CULTURE, BLOOD (ROUTINE X 2)  RESP PANEL BY RT-PCR (RSV, FLU A&B, COVID)  RVPGX2  PROTIME-INR  MAGNESIUM  AMMONIA  TSH  LACTIC ACID, PLASMA  POC OCCULT BLOOD, ED  TYPE AND SCREEN  TROPONIN I (HIGH SENSITIVITY)    EKG EKG Interpretation  Date/Time:  Saturday Mar 27 2023 17:30:01 EDT Ventricular Rate:  106 PR Interval:  167 QRS Duration: 98 QT Interval:  358 QTC Calculation: 476 R Axis:   77 Text Interpretation: Sinus tachycardia Prominent P waves, nondiagnostic Artifact in lead(s) I II III aVR aVL aVF V1 V2 V3 V4 V5 V6 Confirmed by Gloris Manchester [phone] on 03/27/2023 5:39:08 PM  Radiology CT CHEST ABDOMEN PELVIS WO CONTRAST  Result Date: 03/27/2023 CLINICAL DATA:  Polytrauma. EXAM: CT CHEST, ABDOMEN AND PELVIS WITHOUT CONTRAST TECHNIQUE: Multidetector CT imaging of the chest, abdomen and pelvis was performed following the standard protocol without IV contrast. RADIATION DOSE REDUCTION: This exam was performed according to the departmental dose-optimization program which includes automated exposure control, adjustment of the mA and/or kV according to patient size and/or use of iterative reconstruction technique. COMPARISON:  None Avai CT abdomen and pelvis lable. CTA chest abdomen and pelvis 01/10/2018. FINDINGS: CT CHEST FINDINGS Cardiovascular: Heart is mildly enlarged. Patient is status post aortic valve  replacement. There is no pericardial effusion. There are atherosclerotic calcifications of the aorta. There is no pericardial effusion. Mediastinum/Nodes: There is no evidence for mediastinal hematoma or pneumomediastinum. There are enlarged left hilar lymph nodes measuring up to 1.7 x 1.7 cm. There is an enlarged subcarinal lymph node measuring 12 mm short axis. There are enlarged paratracheal lymph nodes and precarinal lymph nodes measuring up to 11 mm. There is a large hiatal hernia containing the proximal stomach. There is a small amount of air in the esophagus. The visualized thyroid gland is within normal limits. Lungs/Pleura: There are innumerable bilateral pulmonary nodules and masses. The largest is a left apical mass abuts the pleura measuring 5.1 x 4.7 by 5.1 cm. Other prominent nodules/masses are in the left lower lobe measuring 4.0 by 3.4 cm image 4/91 and right middle lobe measuring 3.3 x 3.9 cm image 4/92. There is no pleural effusion or pneumothorax. Trachea and central airways are patent. Musculoskeletal: No acute fractures are identified. No suspicious focal osseous lesion. CT ABDOMEN PELVIS FINDINGS Hepatobiliary: Multiple hepatic masses are identified predominantly in the right liver. The largest is in the right lobe measuring 11 x 8.5 cm. There is gallbladder sludge versus small stones. No biliary ductal dilatation. Pancreas: Unremarkable. No pancreatic ductal dilatation or surrounding inflammatory changes. Spleen: Calcified granulomas are present. Otherwise within normal limits. Adrenals/Urinary Tract: The bladder is decompressed by Foley catheter. There is moderate left-sided hydroureteronephrosis to the level of the pelvis likely secondary to obstruction by left pelvic mass. There is no right-sided hydronephrosis. There is no urinary tract calculus. The adrenal glands are within normal limits. Stomach/Bowel: Large hiatal hernia  is present containing the proximal stomach. No evidence of bowel  wall thickening, distention, or inflammatory changes. There is sigmoid colon diverticulosis. The appendix is not visualized. Vascular/Lymphatic: Aorta and IVC are normal in size. There are severe atherosclerotic calcifications of the aorta. There is an enlarged left retroperitoneal lymph node the level of the kidney measuring 2.0 x 1.3 cm image 3/71. There is an enlarged left retroperitoneal lymph node the level of the mid abdominal aorta measuring 1.5 x 2.3 cm image 3/80. There is a left adnexal/left pelvic sidewall mass abutting left iliac vasculature. This is ill-defined due to lack of contrast. This mass measures proximally 6.1 x 4.8 x 5.9 cm. Reproductive: Uterus and right adnexa are within normal limits. There is a left adnexal mass as described above. Other: There is no ascites or focal abdominal wall hernia. Musculoskeletal: No acute fractures are identified. No focal osseous lesion. Degenerative changes affect the spine. IMPRESSION: 1. No acute posttraumatic sequelae in the chest, abdomen or pelvis. 2. Left adnexal/left pelvic sidewall mass worrisome for primary ovarian neoplasm. This causes moderate left-sided hydroureteronephrosis. 3. Multiple pulmonary nodules and masses most compatible with metastatic disease. 4. Mediastinal and left hilar lymphadenopathy worrisome for metastatic disease. 5. Hepatic metastatic disease. 6. Large hiatal hernia. 7. Cholelithiasis versus gallbladder sludge. Aortic Atherosclerosis (ICD10-I70.0). Electronically Signed   By: Darliss Cheney M.D.   On: 03/27/2023 20:49   CT Head Wo Contrast  Result Date: 03/27/2023 CLINICAL DATA:  Mental status change, unknown cause; Neck trauma (Age >= 65y) EXAM: CT HEAD WITHOUT CONTRAST CT CERVICAL SPINE WITHOUT CONTRAST TECHNIQUE: Multidetector CT imaging of the head and cervical spine was performed following the standard protocol without intravenous contrast. Multiplanar CT image reconstructions of the cervical spine were also generated.  RADIATION DOSE REDUCTION: This exam was performed according to the departmental dose-optimization program which includes automated exposure control, adjustment of the mA and/or kV according to patient size and/or use of iterative reconstruction technique. COMPARISON:  None Available. FINDINGS: CT HEAD FINDINGS Brain: No evidence of large-territorial acute infarction. There is a 2.3 x 1.9 cm right cerebellar masslike rounded hyperdensity with an adjacent 1.3 x 0.9 cm masslike hyperdensity. Surrounding vasogenic edema of the largest density. There is also a 0.8 x 0.6 cm left cerebellar round hyperdensity. A 1 cm x 0.7 cm right frontal hyperdense mass is noted (3:16). A 0.7 x 0.9 cm left occipital masslike lesion (3:28). Question associated right cerebellar subarachnoid hemorrhage. Question right frontal calvarial convexity mass versus subarachnoid hemorrhage(5:36). No mass effect or midline shift. No hydrocephalus. Basilar cisterns are patent. Vascular: No hyperdense vessel. Atherosclerotic calcifications are present within the cavernous internal carotid arteries. Skull: No acute fracture or focal lesion. Sinuses/Orbits: Paranasal sinuses and mastoid air cells are clear. Bilateral lens replacement. The orbits are unremarkable. Other: None. CT CERVICAL SPINE FINDINGS Alignment: Normal. Skull base and vertebrae: Moderate to severe multilevel degenerative changes of the spine. No associated severe osseous neural foraminal or central canal stenosis. No acute fracture. No aggressive appearing focal osseous lesion or focal pathologic process. Soft tissues and spinal canal: No prevertebral fluid or swelling. No visible canal hematoma. Upper chest: Left apex pulmonary mass poorly visualized. Innumerable scattered pulmonary nodules within bilateral lung apices. Other: Atherosclerotic plaque of the carotid arteries within the neck as well as main branches the aortic arch. IMPRESSION: 1. Findings suggestive of metastatic  malignancy involving the lungs and brain. Please see separately dictated CT chest, abdomen, pelvis 03/27/2023. 2. Bilateral cerebral and cerebellar masses/metastases with superimposed hemorrhage  not excluded. Recommend MRI brain with and without contrast for further evaluation. 3. Question right cerebellar subarachnoid hemorrhage. 4. Question right frontal calvarial convexity mass versus subarachnoid hemorrhage. 5. No acute displaced fracture or traumatic listhesis of the cervical spine. These results were called by telephone at the time of interpretation on 03/27/2023 at 8:38 pm to provider Gloris Manchester , who verbally acknowledged these results. Electronically Signed   By: Tish Frederickson M.D.   On: 03/27/2023 20:49   CT Cervical Spine Wo Contrast  Result Date: 03/27/2023 CLINICAL DATA:  Mental status change, unknown cause; Neck trauma (Age >= 65y) EXAM: CT HEAD WITHOUT CONTRAST CT CERVICAL SPINE WITHOUT CONTRAST TECHNIQUE: Multidetector CT imaging of the head and cervical spine was performed following the standard protocol without intravenous contrast. Multiplanar CT image reconstructions of the cervical spine were also generated. RADIATION DOSE REDUCTION: This exam was performed according to the departmental dose-optimization program which includes automated exposure control, adjustment of the mA and/or kV according to patient size and/or use of iterative reconstruction technique. COMPARISON:  None Available. FINDINGS: CT HEAD FINDINGS Brain: No evidence of large-territorial acute infarction. There is a 2.3 x 1.9 cm right cerebellar masslike rounded hyperdensity with an adjacent 1.3 x 0.9 cm masslike hyperdensity. Surrounding vasogenic edema of the largest density. There is also a 0.8 x 0.6 cm left cerebellar round hyperdensity. A 1 cm x 0.7 cm right frontal hyperdense mass is noted (3:16). A 0.7 x 0.9 cm left occipital masslike lesion (3:28). Question associated right cerebellar subarachnoid hemorrhage.  Question right frontal calvarial convexity mass versus subarachnoid hemorrhage(5:36). No mass effect or midline shift. No hydrocephalus. Basilar cisterns are patent. Vascular: No hyperdense vessel. Atherosclerotic calcifications are present within the cavernous internal carotid arteries. Skull: No acute fracture or focal lesion. Sinuses/Orbits: Paranasal sinuses and mastoid air cells are clear. Bilateral lens replacement. The orbits are unremarkable. Other: None. CT CERVICAL SPINE FINDINGS Alignment: Normal. Skull base and vertebrae: Moderate to severe multilevel degenerative changes of the spine. No associated severe osseous neural foraminal or central canal stenosis. No acute fracture. No aggressive appearing focal osseous lesion or focal pathologic process. Soft tissues and spinal canal: No prevertebral fluid or swelling. No visible canal hematoma. Upper chest: Left apex pulmonary mass poorly visualized. Innumerable scattered pulmonary nodules within bilateral lung apices. Other: Atherosclerotic plaque of the carotid arteries within the neck as well as main branches the aortic arch. IMPRESSION: 1. Findings suggestive of metastatic malignancy involving the lungs and brain. Please see separately dictated CT chest, abdomen, pelvis 03/27/2023. 2. Bilateral cerebral and cerebellar masses/metastases with superimposed hemorrhage not excluded. Recommend MRI brain with and without contrast for further evaluation. 3. Question right cerebellar subarachnoid hemorrhage. 4. Question right frontal calvarial convexity mass versus subarachnoid hemorrhage. 5. No acute displaced fracture or traumatic listhesis of the cervical spine. These results were called by telephone at the time of interpretation on 03/27/2023 at 8:38 pm to provider Gloris Manchester , who verbally acknowledged these results. Electronically Signed   By: Tish Frederickson M.D.   On: 03/27/2023 20:49   DG Chest Port 1 View  Result Date: 03/27/2023 CLINICAL DATA:   Questionable sepsis EXAM: PORTABLE CHEST 1 VIEW COMPARISON:  Chest x-ray 02/15/2018. FINDINGS: There are multiple new bilateral pulmonary nodules and masses. The largest is in the left lung apex measuring 6.6 cm. There is focal triangular shaped opacity in the lower right hemithorax which may represent atelectasis, airspace disease or pleural effusion. There is no pneumothorax. The heart is enlarged. Patient  is status post TAVR. No acute fractures are seen. IMPRESSION: 1. Multiple new bilateral pulmonary nodules and masses, highly suspicious for metastatic disease. 2. Focal triangular shaped opacity in the lower right hemithorax which may represent atelectasis, airspace disease or pleural effusion. Electronically Signed   By: Darliss Cheney M.D.   On: 03/27/2023 19:14    Procedures Procedures    Medications Ordered in ED Medications  lactated ringers infusion (has no administration in time range)  lactated ringers bolus 1,000 mL (1,000 mLs Intravenous New Bag/Given 03/27/23 1944)    And  lactated ringers bolus 250 mL (has no administration in time range)  metroNIDAZOLE (FLAGYL) IVPB 500 mg (has no administration in time range)  levETIRAcetam (KEPPRA) IVPB 1500 mg/ 100 mL premix (has no administration in time range)  dexamethasone (DECADRON) injection 10 mg (has no administration in time range)  dexamethasone (DECADRON) injection 4 mg (has no administration in time range)  ondansetron (ZOFRAN) injection 4 mg (has no administration in time range)  ceFEPIme (MAXIPIME) 2 g in sodium chloride 0.9 % 100 mL IVPB (has no administration in time range)  vancomycin (VANCOREADY) IVPB 1250 mg/250 mL (has no administration in time range)    Followed by  vancomycin (VANCOCIN) IVPB 1000 mg/200 mL premix (has no administration in time range)  lactated ringers bolus 1,000 mL (0 mLs Intravenous Stopped 03/27/23 1935)  ceFEPIme (MAXIPIME) 2 g in sodium chloride 0.9 % 100 mL IVPB (2 g Intravenous New Bag/Given 03/27/23  2042)    ED Course/ Medical Decision Making/ A&P                             Medical Decision Making Amount and/or Complexity of Data Reviewed Labs: ordered. Radiology: ordered. ECG/medicine tests: ordered.  Risk Prescription drug management.   This patient presents to the ED for concern of altered mental status, this involves an extensive number of treatment options, and is a complaint that carries with it a high risk of complications and morbidity.  The differential diagnosis includes head trauma, infection, dehydration, metabolic derangements, uremia, seizure, CVA, intoxication, withdrawal   Co morbidities that complicate the patient evaluation  CKD, breast cancer, HTN, DM, HLD, CAD, aortic stenosis s/p TAVR   Additional history obtained:  Additional history obtained from EMS, patient's daughter External records from outside source obtained and reviewed including EMR   Lab Tests:  I Ordered, and personally interpreted labs.  The pertinent results include: Leukocytosis and lactic acidosis concerning for sepsis; acute on chronic anemia is present; CK is moderately elevated.  Troponin is mildly elevated.  Baseline CKD is present.  Hyperglycemia is present with mildly elevated serum beta hydroxybutyrate.   Imaging Studies ordered:  I ordered imaging studies including chest x-ray, CT of head, C-spine, chest, abdomen, pelvis; MRI brain I independently visualized and interpreted imaging which showed multiple findings concerning for severe metastatic disease: Bilateral cerebellar masses with superimposed hemorrhage; cerebellar SAH; right frontal mass versus SAH; multiple pulmonary nodules and masses; mediastinal and left hilar lymphadenopathy; hepatic metastatic disease; left adnexal mass worrisome for ovarian neoplasm I agree with the radiologist interpretation   Cardiac Monitoring: / EKG:  The patient was maintained on a cardiac monitor.  I personally viewed and interpreted  the cardiac monitored which showed an underlying rhythm of: Sinus rhythm   Consultations Obtained:  I requested consultation with the neurologist, Dr. Derry Lory,  and discussed lab and imaging findings as well as pertinent plan - they recommend:  MRI, Keppra, Decadron.  Neurology will follow in consult.   Problem List / ED Course / Critical interventions / Medication management  Patient presenting for altered mental status.  Last known well was yesterday.  She was found on the floor of her bathroom today.  On arrival in the ED, she is awake and alert.  She is disoriented.  She has no focal deficits.  Although her abdomen is seems mildly distended, she denies any current abdominal pain or tenderness.  On bedside ultrasound, she does have a large distended bladder.  Foley catheter was ordered.  EMS noted CBG in the high 500s.  She received only 100 cc of IVF prior to arrival.  Additional IV fluids were ordered.  Workup was initiated.  Daughter joined her at bedside and was able to provide further history.  Daughter confirms that last known well was 5:30 PM yesterday.  Patient was actually at a friend's house overnight and daughter believes that she ended up on the bathroom floor sometime last night or this morning given that the bed appeared to have been slept in.  Daughter reports that patient would be able to get up off of the floor on her own power on a normal day.  Patient currently continues to deny any areas of discomfort.  Initial lab work was notable for leukocytosis and lactic acidosis.  Patient was treated empirically for sepsis with IV fluids and broad-spectrum antibiotics.  Chest x-ray showed findings concerning for metastatic lung disease.  On CT imaging, these findings were redemonstrated.  Patient has masses and nodules throughout her lungs.  She was also found to have hilar, hepatic, and adnexal masses.  There were some areas of bleeding on CT head which is also concerning for metastatic  disease.  I spoke with neurologist on-call, Dr. Derry Lory, who will see the patient in consult.  He recommends Keppra for seizure prophylaxis, Decadron for metastatic brain disease, and MRI for further evaluation.  Patient and daughter were updated on all findings.  It is unclear of how much patient comprehends at this point.  Daughter expressed understanding.  Patient was admitted for further management. I ordered medication including IV fluids and broad-spectrum antibiotics for empiric treatment of sepsis; Keppra for seizure prophylaxis; Decadron for metastatic brain disease; Zofran for nausea Reevaluation of the patient after these medicines showed that the patient improved I have reviewed the patients home medicines and have made adjustments as needed   Social Determinants of Health:  Lives independently  CRITICAL CARE Performed by: Gloris Manchester   Total critical care time: 35 minutes  Critical care time was exclusive of separately billable procedures and treating other patients.  Critical care was necessary to treat or prevent imminent or life-threatening deterioration.  Critical care was time spent personally by me on the following activities: development of treatment plan with patient and/or surrogate as well as nursing, discussions with consultants, evaluation of patient's response to treatment, examination of patient, obtaining history from patient or surrogate, ordering and performing treatments and interventions, ordering and review of laboratory studies, ordering and review of radiographic studies, pulse oximetry and re-evaluation of patient's condition.         Final Clinical Impression(s) / ED Diagnoses Final diagnoses:  Nontraumatic intracerebral hemorrhage of cerebellum, unspecified laterality [facility])  Disorientation  Pulmonary mass  Hyperglycemia    Rx / DC Orders ED Discharge Orders     None         Gloris Manchester, MD 03/27/23 2124

## 2023-03-27 NOTE — ED Triage Notes (Signed)
Patient BIB [facility] from home after family found patient in the bathroom floor with coffee ground emesis and dark stool per EMS. Patient is incontinent with a GCS of 14. CBG 594. Patient is hypertensive, coffee ground emesis noted on face. Multiple bruises noted on patient's body. No hx of thinner. Patient lives independently per EMS.

## 2023-03-28 DIAGNOSIS — R569 Unspecified convulsions: Secondary | ICD-10-CM

## 2023-03-28 DIAGNOSIS — C7989 Secondary malignant neoplasm of other specified sites: Secondary | ICD-10-CM | POA: Diagnosis not present

## 2023-03-28 LAB — CULTURE, BLOOD (ROUTINE X 2)

## 2023-03-28 MED ORDER — HYDROMORPHONE HCL 1 MG/ML IJ SOLN: 0.5000 mg | INTRAMUSCULAR | Status: DC | PRN

## 2023-03-28 MED ORDER — LACTATED RINGERS IV SOLN: INTRAVENOUS | Status: DC

## 2023-03-28 MED ORDER — LEVETIRACETAM IN NACL 500 MG/100ML IV SOLN
500.0000 mg | Freq: Two times a day (BID) | INTRAVENOUS | Status: DC
  Administered 2023-03-28: 500 mg via INTRAVENOUS
  Filled 2023-03-28 (×4): qty 100

## 2023-03-28 MED ORDER — LORAZEPAM 2 MG/ML PO CONC: 1.0000 mg | ORAL | Status: DC | PRN

## 2023-03-28 MED ORDER — LORAZEPAM 1 MG PO TABS: 1.0000 mg | ORAL_TABLET | ORAL | Status: DC | PRN

## 2023-03-28 MED ORDER — LORAZEPAM 2 MG/ML IJ SOLN
1.0000 mg | INTRAMUSCULAR | Status: DC | PRN
  Administered 2023-03-28: 1 mg via INTRAVENOUS
  Filled 2023-03-28: qty 1

## 2023-03-28 NOTE — Consult Note (Signed)
Consultation Note Date: 03/28/2023   Patient Name: Mallory Thomas  DOB: 1931/12/24  MRN: [id number]  Age / Sex: 89 y.o., female  PCP: Melida Quitter, MD Referring Physician: Narda Bonds, MD  Reason for Consultation: "Newly diagnosed widespread metastatic disease.  Family and patient wish for comfort measures only.  They are requesting hospice evaluation, preferably hospice at home."  HPI/Patient Profile: 89 y.o. female  with past medical history of CKD stage IV, AAS s/p TAVR, T2DM, HTN, HLD, PAD s/p right iliac stent 2017, CAD, right breast cancer in remission presented to the ED on 03/27/23 from home after unwitnessed fall now with AMS. CT head showed findings suggestive of metastatic malignancy involving lungs and brain.  CT chest/abdomen/pelvis was worrisome for primary ovarian neoplasm.  MRI showed numerous hemorrhagic lesions. 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  After goals of care discussions with admitting provider, family has opted for patient's transition to full comfort measures.  Clinical Assessment and Goals of Care: I have reviewed medical records including EPIC notes, labs, and imaging. Received report from primary RN -no acute concerns.  Went to visit patient at bedside -daughter/Leanne, son/Dave, and 2 other visitors present. Patient was lying in bed asleep -she does not wake to voice or gentle touch. No signs or non-verbal gestures of pain or discomfort noted. No respiratory distress, increased work of breathing, or secretions noted.   Confirmed goal is for full comfort measures.  Merita Norton request to meet with PMT when patient's other daughter/Heidi arrives at 1 PM today.  Patient has 3 children and they are all involved in decision making. Scheduled meeting for 1:15  PM.  Comfort medications adjusted.  Discussed Keppra dosing with pharmacy.  1:15 PM  Met with daughter/Leanne, son/Dave, HCPOA/daughter/Heidi  to discuss diagnosis, prognosis, GOC, EOL wishes, disposition, and options.  I introduced Palliative Medicine as specialized medical care for people living with serious illness. It focuses on providing relief from the symptoms and stress of a serious illness. The goal is to improve quality of life for both the patient and the family.  Heidi confirms goal is for full comfort measures.  Family have a clear understanding of patient's current acute medical situation. Prognosis reviewed.  Provided education and counseling at length on the philosophy and benefits of hospice care. Discussed that it offers a holistic approach to care in the setting of end-stage illness, and is about supporting the patient where they are allowing nature to take it's course. Discussed the hospice team includes RNs, physicians, social workers, and chaplains. They can provide personal care, support for the family, and help keep patient out of the hospital as well as assist with DME needs for home hospice. Education provided on the difference between home vs residential hospice. Provided reassurance that residential hospice referral could be cancelled (would anticipate hospital death) at any time if patient's condition changed and it was felt they were too unstable for transfer. Allowed space and time for family to discuss information - they are most interested in her transfer to residential hospice, requesting [facility].  Answered questions regarding: transportation to hospice facility, patient's desire for [facility] donation with [facility], recent PRN medications given and indication, natural trajectory at EOL.  Visit also consisted of discussions dealing with the complex and emotionally intense issues of symptom management for patient at end of life.  Discussed with family the  importance of continued  conversation with each other and the medical providers regarding overall plan of care and treatment options, ensuring decisions are within the context of the patient's values and GOCs.    Questions and concerns were addressed. The patient/family was encouraged to call with questions and/or concerns. PMT card was provided.   Primary Decision Maker: HCPOA - daughter/Heidi Priestly    SUMMARY OF RECOMMENDATIONS   Continue full comfort measures Continue DNR/DNI as previously documented - durable DNR form completed and given to primary RN. Copy was made and will be scanned into Vynca/ACP tab Family request [facility] transfer - TOC consult placed; TOC and hospice liaison notified Added orders for EOL symptom management and to reflect full comfort measures, as well as discontinued orders that were not focused on comfort Unrestricted visitation orders were placed per current [facility] EOL visitation policy  Nursing to provide frequent assessments and administer PRN medications as clinically necessary to ensure EOL comfort PMT will continue to follow and support holistically  Symptom Management Continue Decadron and Keppra for edema and seizure prophylaxis Adjusted PO Keppra to IV - patient not able to tolerate POs Dilaudid PRN pain/dyspnea/increased work of breathing/RR>25 Tylenol PRN pain/fever Biotin PRN dry mouth Benadryl PRN itching Robinul PRN secretions Haldol PRN agitation/delirium Ativan PRN anxiety/seizure/sleep/distress Zofran PRN nausea/vomiting Liquifilm Tears PRN dry eye   Code Status/Advance Care Planning: DNR  Palliative Prophylaxis:  Aspiration, Delirium Protocol, Frequent Pain Assessment, Oral Care, and Turn Reposition  Additional Recommendations (Limitations, Scope, Preferences): Full Comfort Care  Psycho-social/Spiritual:  Desire for further Chaplaincy support:no Created space and opportunity for patient and family to express thoughts  and feelings regarding patient's current medical situation.  Emotional support and therapeutic listening provided.  Prognosis:  < 2 weeks  Discharge Planning: Hospice facility      Primary Diagnoses: Present on Admission:  Metastatic disease (HCC)  Coronary artery disease involving native coronary artery with other form of angina pectoris, unspecified whether native or transplanted heart (HCC)  CKD (chronic kidney disease), stage IV (HCC)   I have reviewed the medical record, interviewed the patient and family, and examined the patient. The following aspects are pertinent.  Past Medical History:  Diagnosis Date   Aortic stenosis    Breast cancer (HCC) 2004   right   Diabetes mellitus    Heart murmur    Hyperlipidemia    Hypertension    S/P TAVR (transcatheter aortic valve replacement) 02/15/2018   23 mm Edwards Sapien 3 transcatheter heart valve placed via percutaneous right transfemoral approach    Social History   Socioeconomic History   Marital status: Widowed    Spouse name: Not on file   Number of children: 3   Years of education: Not on file   Highest education level: Not on file  Occupational History   Not on file  Tobacco Use   Smoking status: Former    Packs/day: 1.00    Years: 15.00    Additional pack years: 0.00    Total pack years: 15.00    Types: Cigarettes    Quit date: 06/04/1968    Years since quitting: 54.8   Smokeless tobacco: Never  Vaping Use   Vaping Use: Never used  Substance and Sexual Activity   Alcohol use: Yes    Comment: occasional   Drug use: No   Sexual activity: Not on file  Other Topics Concern   Not on file  Social History Narrative   Not on file   Social Determinants of Health  Financial Resource Strain: Not on file  Food Insecurity: Not on file  Transportation Needs: Not on file  Physical Activity: Not on file  Stress: Not on file  Social Connections: Not on file   Family History  Problem Relation Age of Onset    Heart attack Mother    Heart attack Father    Breast cancer Neg Hx    Scheduled Meds:  dexamethasone (DECADRON) injection  4 mg Intravenous Q6H   levETIRAcetam  500 mg Oral BID   Continuous Infusions:  lactated ringers 75 mL/hr at 03/28/23 0420   PRN Meds:.acetaminophen **OR** acetaminophen, antiseptic oral rinse, chlorproMAZINE, diphenhydrAMINE, glycopyrrolate **OR** glycopyrrolate **OR** glycopyrrolate, haloperidol **OR** haloperidol **OR** haloperidol lactate, HYDROmorphone (DILAUDID) injection, LORazepam **OR** LORazepam **OR** LORazepam, ondansetron **OR** ondansetron (ZOFRAN) IV, oxyCODONE, polyvinyl alcohol, traZODone Medications Prior to Admission:  Prior to Admission medications   Medication Sig Start Date End Date Taking? Authorizing Provider  amLODipine (NORVASC) 10 MG tablet TAKE 1 TABLET BY MOUTH EVERY DAY Patient taking differently: Take 10 mg by mouth daily. 01/08/23  Yes Yates Decamp, MD  aspirin 81 MG tablet Take 81 mg by mouth at bedtime.   Yes [provider]  atorvastatin (LIPITOR) 20 MG tablet Take 20 mg by mouth daily.   Yes [provider]  dapagliflozin propanediol (FARXIGA) 10 MG TABS tablet Take 10 mg by mouth daily. 06/12/22  Yes [provider]  metFORMIN (GLUCOPHAGE-XR) 500 MG 24 hr tablet Take 500 mg by mouth every evening. 03/02/23  Yes [provider]  alendronate (FOSAMAX) 70 MG tablet Take 70 mg by mouth once a week. Take with a full glass of water on an empty stomach.    [provider]  hydrochlorothiazide (MICROZIDE) 12.5 MG capsule Take 1 capsule (12.5 mg total) by mouth daily. 07/07/18   Magrinat, Valentino Hue, MD   Allergies  Allergen Reactions   Fish Allergy Hives   Iodine Hives    Allergic to ALL seafood, shrimp, crab etc.   Olmesartan Other (See Comments)    Hyperkalemia   Shellfish Allergy Hives   Latex Hives   Tape Hives   Zetia [Ezetimibe] Diarrhea   Review of Systems  Unable to perform ROS: Acuity  of condition    Physical Exam Vitals and nursing note reviewed.  Constitutional:      General: She is not in acute distress.    Appearance: She is ill-appearing.  Pulmonary:     Effort: No respiratory distress.  Skin:    General: Skin is warm and dry.  Neurological:     Mental Status: She is unresponsive.     Motor: Weakness present.  Psychiatric:        Speech: She is noncommunicative.     Vital Signs: BP 131/63 (BP Location: Right Arm)   Pulse 85   Temp 98.8 F (37.1 C) (Oral)   Resp 16   Ht 5\' 2"  (1.575 m)   Wt 69.2 kg   SpO2 92%   BMI 27.90 kg/m  Pain Scale: 0-10   Pain Score: 0-No pain   SpO2: SpO2: 92 % O2 Device:SpO2: 92 % O2 Flow Rate: .O2 Flow Rate (L/min): 2 L/min  IO: Intake/output summary:  Intake/Output Summary (Last 24 hours) at 03/28/2023 1151 Last data filed at 03/27/2023 2312 Gross per 24 hour  Intake 100 ml  Output --  Net 100 ml    LBM: Last BM Date : 03/27/23 Baseline Weight: Weight: 69.2 kg Most recent weight: Weight: 69.2 kg  Palliative Assessment/Data: PPS 10-20%     Time In-Out: 1150-1220/1315-1415 Time Total: 90 minutes Greater than 50%  of this time was spent counseling and coordinating care related to the above assessment and plan.  Signed by: Haskel Khan, NP   Please contact Palliative Medicine Team phone at [phone] for questions and concerns.  For individual provider: See Amion  *Portions of this note are a verbal dictation therefore any spelling and/or grammatical errors are due to the "[employer] One" system interpretation.

## 2023-03-28 NOTE — Progress Notes (Signed)
I visited Mallory Thomas man and her family.  I know them well.   Spoke to the patient. They are thinking about [facility] vs Home Hospice.   Offered them help in coordinating the transfer of care. Gave them my cell phone number.   Left message with RN to call me as well.   JG

## 2023-03-28 NOTE — Procedures (Addendum)
Patient Name: Mallory Thomas  MRN: [id number]  Epilepsy Attending: Charlsie Quest  Referring Physician/Provider: Erick Blinks, MD  Date: 03/27/2023 Duration: 27.54 mins  Patient history:  89 y.o. female with medical history significant for CKD stage IV, AAS s/p TAVR, T2DM, HTN, HLD, PAD s/p right iliac stent 2017, CAD, right breast cancer in remission who presented after she was found down with coffee-ground emesis.  Workup revealing for widespread metastatic appearing disease. EEG to evaluate for seizure.  Level of alertness: Awake  AEDs during EEG study: LEV  Technical aspects: This EEG study was done with scalp electrodes positioned according to the 10-20 International system of electrode placement. Electrical activity was reviewed with band pass filter of 1-70Hz , sensitivity of 7 uV/mm, display speed of 57mm/sec with a 60Hz  notched filter applied as appropriate. EEG data were recorded continuously and digitally stored.  Video monitoring was available and reviewed as appropriate.  Description: The posterior dominant rhythm consists of 7 Hz activity of moderate voltage (25-35 uV) seen predominantly in posterior head regions, symmetric and reactive to eye opening and eye closing. EEG showed continuous generalized predominantly 5-6 Hz theta slowing admixed with 2-3Hz  delta slowing. Hyperventilation and photic stimulation were not performed.      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 No seizures or epileptiform discharges were seen throughout the recording.   Please note lack of epileptiform activity interictal EEG does not exclude the diagnosis of epilepsy.   Timea Breed Annabelle Harman

## 2023-03-28 NOTE — ED Notes (Signed)
ED TO INPATIENT HANDOFF REPORT  ED Nurse Name and Phone #: [phone]  S Name/Age/Gender Mallory Thomas 89 y.o. female Room/Bed: 009C/009C  Code Status   Code Status: DNR  Home/SNF/Other Home Patient oriented to: self Is this baseline? Yes   Triage Complete: Triage complete  Chief Complaint Metastatic disease [facility]) [C79.9]  Triage Note Patient BIB [facility] from home after family found patient in the bathroom floor with coffee ground emesis and dark stool per EMS. Patient is incontinent with a GCS of 14. CBG 594. Patient is hypertensive, coffee ground emesis noted on face. Multiple bruises noted on patient's body. No hx of thinner. Patient lives independently per EMS.    Allergies Allergies  Allergen Reactions   Fish Allergy Hives   Iodine Hives    Allergic to ALL seafood, shrimp, crab etc.   Olmesartan Other (See Comments)    Hyperkalemia   Shellfish Allergy Hives   Latex Hives   Tape Hives   Zetia [Ezetimibe] Diarrhea    Level of Care/Admitting Diagnosis ED Disposition     ED Disposition  Admit   Condition  --   Comment  Hospital [facility] [[id number]]  Level of Care: Palliative Care [15]  May admit patient to [facility] or [facility] if equivalent level of care is available:: No  Covid Evaluation: Asymptomatic - no recent exposure (last 10 days) testing not required  Diagnosis: Metastatic disease 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   23 mm Edwards Sapien 3 transcatheter heart valve placed via  percutaneous right transfemoral approach    Past Surgical History:  Procedure Laterality Date   ABDOMINAL AORTOGRAM N/A 11/22/2018   Procedure: ABDOMINAL AORTOGRAM;  Surgeon: Yates Decamp, MD;  [facility]: [facility] INVASIVE CV LAB;  Service: Cardiovascular;  Laterality: N/A;   BILATERAL KNEE ARTHROSCOPY     BREAST LUMPECTOMY Right 2004   radiation    CARDIAC CATHETERIZATION     12/24/17   CORONARY ULTRASOUND/IVUS N/A 12/24/2017   Procedure: Intravascular Ultrasound/IVUS;  Surgeon: Yates Decamp, MD;  Location: [facility] INVASIVE CV LAB;  Service: Cardiovascular;  Laterality: N/A;   LOWER EXTREMITY ANGIOGRAPHY Bilateral 11/22/2018   Procedure: LOWER EXTREMITY ANGIOGRAPHY;  Surgeon: Yates Decamp, MD;  Location: [facility] INVASIVE CV LAB;  Service: Cardiovascular;  Laterality: Bilateral;   lymph node removal right arm     PERIPHERAL VASCULAR CATHETERIZATION N/A 11/10/2016   Procedure: Lower Extremity Angiography;  Surgeon: Yates Decamp, MD;  Location: [facility] INVASIVE CV LAB;  Service: Cardiovascular;  Laterality: N/A;   PERIPHERAL VASCULAR CATHETERIZATION Right 11/10/2016   Procedure: Peripheral Vascular Intervention;  Surgeon: Yates Decamp, MD;  Location: [facility] INVASIVE CV LAB;  Service: Cardiovascular;  Laterality: Right;  Rt Com Iliac   TEE WITHOUT CARDIOVERSION N/A 02/15/2018   Procedure: TRANSESOPHAGEAL ECHOCARDIOGRAM (TEE);  Surgeon: Tonny Bollman, MD;  Location: [facility] OR;  Service: Open Heart Surgery;  Laterality: N/A;   TONSILLECTOMY     TRANSCATHETER AORTIC VALVE REPLACEMENT, TRANSFEMORAL N/A 02/15/2018   Procedure: TRANSCATHETER AORTIC VALVE REPLACEMENT, TRANSFEMORAL;  Surgeon: Tonny Bollman, MD;  Location: [facility] OR;  Service: Open Heart Surgery;  Laterality: N/A;     A IV Location/Drains/Wounds Patient Lines/Drains/Airways Status     Active Line/Drains/Airways     Name Placement date Placement time Site Days   Peripheral IV 03/27/23 20 G 1.88" Anterior;Left Forearm 03/27/23  2232  Forearm  1   Incision (Closed) 02/15/18 Groin  Right 02/15/18  1412  -- 1867   Incision (Closed) 02/15/18 Groin Left 02/15/18  1412  -- 1867            Intake/Output Last 24 hours  Intake/Output Summary (Last 24 hours) at 03/28/2023 1224 Last data filed at 03/27/2023 2312 Gross per 24 hour  Intake 100 ml  Output --  Net 100 ml    Labs/Imaging Results for orders placed or performed during the hospital encounter of 03/27/23 (from the past 48 hour(s))  Lactic acid, plasma     Status: Abnormal   Collection Time: 03/27/23  5:31 PM  Result Value Ref Range   Lactic Acid, Venous 3.5 (HH) 0.5 - 1.9 mmol/L    Comment: CRITICAL RESULT CALLED TO, READ BACK BY AND VERIFIED WITH P.BLANCHARD RN @1932  03/27/23 E,BENTON Performed at [facility] Lab, [address] [place], [place] [address]   Comprehensive metabolic panel     Status: Abnormal   Collection Time: 03/27/23  5:31 PM  Result Value Ref Range   Sodium 138 135 - 145 mmol/L   Potassium 4.8 3.5 - 5.1 mmol/L    Comment: HEMOLYSIS AT THIS LEVEL MAY AFFECT RESULT   Chloride 102 98 - 111 mmol/L   CO2 19 (L) 22 - 32 mmol/L   Glucose, Bld 386 (H) 70 - 99 mg/dL    Comment: Glucose reference range applies only to samples taken after fasting for at least 8 hours.   BUN 57 (H) 8 - 23 mg/dL   Creatinine, Ser 6.04 (H) 0.44 - 1.00 mg/dL   Calcium 8.5 (L) 8.9 - 10.3 mg/dL   Total Protein 7.2 6.5 - 8.1 g/dL   Albumin 2.8 (L) 3.5 - 5.0 g/dL   AST 50 (H) 15 - 41 U/L    Comment: HEMOLYSIS AT THIS LEVEL MAY AFFECT RESULT   ALT 28 0 - 44 U/L    Comment: HEMOLYSIS AT THIS LEVEL MAY AFFECT RESULT   Alkaline Phosphatase 162 (H) 38 - 126 U/L   Total Bilirubin 1.0 0.3 - 1.2 mg/dL    Comment: HEMOLYSIS AT THIS LEVEL MAY AFFECT RESULT   GFR, Estimated 25 (L) >60 mL/min    Comment: (NOTE) Calculated using the CKD-EPI Creatinine Equation (2021)    Anion gap 17 (H) 5 - 15    Comment: Performed at [facility] Lab, [address] [place], [place] [address]  CBC with Differential     Status:  Abnormal   Collection Time: 03/27/23  5:31 PM  Result Value Ref Range   WBC 14.6 (H) 4.0 - 10.5 K/uL   RBC 3.71 (L) 3.87 - 5.11 MIL/uL   Hemoglobin 9.7 (L) 12.0 - 15.0 g/dL   HCT 11.9 (L) 14.7 - 82.9 %   MCV 85.4 80.0 - 100.0 fL   MCH 26.1 26.0 - 34.0 pg   MCHC 30.6 30.0 - 36.0 g/dL   RDW 56.2 (H) 13.0 - 86.5 %   Platelets 384 150 - 400 K/uL   nRBC 0.0 0.0 - 0.2 %   Neutrophils Relative % 90 %   Neutro Abs 13.3 (H)  1.7 - 7.7 K/uL   Lymphocytes Relative 3 %   Lymphs Abs 0.4 (L) 0.7 - 4.0 K/uL   Monocytes Relative 6 %   Monocytes Absolute 0.8 0.1 - 1.0 K/uL   Eosinophils Relative 0 %   Eosinophils Absolute 0.0 0.0 - 0.5 K/uL   Basophils Relative 0 %   Basophils Absolute 0.0 0.0 - 0.1 K/uL   Immature Granulocytes 1 %   Abs Immature Granulocytes 0.12 (H) 0.00 - 0.07 K/uL    Comment: Performed at [facility] Lab, [address], [place], [place] [address]  Protime-INR     Status: None   Collection Time: 03/27/23  5:31 PM  Result Value Ref Range   Prothrombin Time 15.0 11.4 - 15.2 seconds   INR 1.2 0.8 - 1.2    Comment: (NOTE) INR goal varies based on device and disease states. Performed at [facility] Lab, [address] [place], [place] [address]   Urinalysis, w/ Reflex to Culture (Infection Suspected) -Urine, Clean Catch     Status: Abnormal   Collection Time: 03/27/23  5:31 PM  Result Value Ref Range   Specimen Source URINE, CLEAN CATCH    Color, Urine YELLOW YELLOW   APPearance CLEAR CLEAR   Specific Gravity, Urine 1.017 1.005 - 1.030   pH 5.0 5.0 - 8.0   Glucose, UA >=500 (A) NEGATIVE mg/dL   Hgb urine dipstick MODERATE (A) NEGATIVE   Bilirubin Urine NEGATIVE NEGATIVE   Ketones, ur NEGATIVE NEGATIVE mg/dL   Protein, ur 30 (A) NEGATIVE mg/dL   Nitrite NEGATIVE NEGATIVE   Leukocytes,Ua NEGATIVE NEGATIVE   RBC / HPF 6-10 0 - 5 RBC/hpf   WBC, UA 0-5 0 - 5 WBC/hpf    Comment:        Reflex urine culture not performed if WBC <=10, OR if Squamous  epithelial cells >5. If Squamous epithelial cells >5 suggest recollection.    Bacteria, UA NONE SEEN NONE SEEN   Squamous Epithelial / HPF 0-5 0 - 5 /HPF    Comment: Performed at [facility] Lab, [address], [place], [place] [address]  Troponin I (High Sensitivity)     Status: Abnormal   Collection Time: 03/27/23  5:31 PM  Result Value Ref Range   Troponin I (High Sensitivity) 43 (H) <18 ng/L    Comment: (NOTE) Elevated high sensitivity troponin I (hsTnI) values and significant  changes across serial measurements may suggest ACS but many other  chronic and acute conditions are known to elevate hsTnI results.  Refer to the "Links" section for chest pain algorithms and additional  guidance. Performed at [facility] Lab, [address], [place], [place] [address]   Magnesium     Status: None   Collection Time: 03/27/23  5:31 PM  Result Value Ref Range   Magnesium 1.9 1.7 - 2.4 mg/dL    Comment: Performed at [facility] Lab, [address], [place], [place] [address]  CK     Status: Abnormal   Collection Time: 03/27/23  5:31 PM  Result Value Ref Range   Total CK 681 (H) 38 - 234 U/L    Comment: HEMOLYSIS AT THIS LEVEL MAY AFFECT RESULT Performed at [facility] Lab, [address], [place], [place] [address]   Osmolality     Status: Abnormal   Collection Time: 03/27/23  5:31 PM  Result Value Ref Range   Osmolality 324 (HH) 275 - 295 mOsm/kg    Comment: REPEATED  TO VERIFY CRITICAL RESULT CALLED TO, READ BACK BY AND VERIFIED WITH: Called to Alric Quan, RN @2000  03/27/2023 by S.Stanley Performed at [facility] Lab, [address], [place], [place] [address]   Ammonia     Status: None   Collection Time: 03/27/23  5:32 PM  Result Value Ref Range   Ammonia <10 9 - 35 umol/L    Comment: Performed at [facility] Lab, [address] [place], [place] [address]  TSH     Status: None   Collection Time: 03/27/23  5:33 PM  Result Value Ref Range   TSH 1.372  0.350 - 4.500 uIU/mL    Comment: Performed by a 3rd Generation assay with a functional sensitivity of <=0.01 uIU/mL. Performed at [facility] Lab, [address] [place], [place] [address]   Beta-hydroxybutyric acid     Status: Abnormal   Collection Time: 03/27/23  5:33 PM  Result Value Ref Range   Beta-Hydroxybutyric Acid 0.74 (H) 0.05 - 0.27 mmol/L    Comment: Performed at [facility] Lab, [address] [place], [place] [address]  Type and screen [facility]     Status: None   Collection Time: 03/27/23  6:28 PM  Result Value Ref Range   ABO/RH(D) A POS    Antibody Screen NEG    Sample Expiration      03/30/2023,2359 Performed at [facility] Lab, [address], [place], [place] [address]   POC occult blood, ED     Status: None   Collection Time: 03/27/23  6:35 PM  Result Value Ref Range   Fecal Occult Bld NEGATIVE NEGATIVE  I-Stat Chem 8, ED     Status: Abnormal   Collection Time: 03/27/23  6:44 PM  Result Value Ref Range   Sodium 138 135 - 145 mmol/L   Potassium 4.3 3.5 - 5.1 mmol/L   Chloride 106 98 - 111 mmol/L   BUN 52 (H) 8 - 23 mg/dL   Creatinine, Ser 1.30 (H) 0.44 - 1.00 mg/dL   Glucose, Bld 865 (H) 70 - 99 mg/dL    Comment: Glucose reference range applies only to samples taken after fasting for at least 8 hours.   Calcium, Ion 1.08 (L) 1.15 - 1.40 mmol/L   TCO2 21 (L) 22 - 32 mmol/L   Hemoglobin 10.9 (L) 12.0 - 15.0 g/dL   HCT 78.4 (L) 69.6 - 29.5 %  I-Stat venous blood gas, ED ([facility])     Status: Abnormal   Collection Time: 03/27/23  6:44 PM  Result Value Ref Range   pH, Ven 7.386 7.25 - 7.43   pCO2, Ven 34.4 (L) 44 - 60 mmHg   pO2, Ven 61 (H) 32 - 45 mmHg   Bicarbonate 20.7 20.0 - 28.0 mmol/L   TCO2 22 22 - 32 mmol/L   O2 Saturation 91 %   Acid-base deficit 4.0 (H) 0.0 - 2.0 mmol/L   Sodium 137 135 - 145 mmol/L   Potassium 4.3 3.5 - 5.1 mmol/L   Calcium, Ion 1.09 (L) 1.15 - 1.40 mmol/L   HCT 32.0 (L) 36.0 - 46.0 %    Hemoglobin 10.9 (L) 12.0 - 15.0 g/dL   Sample type VENOUS   Troponin I (High Sensitivity)     Status: Abnormal   Collection Time: 03/27/23 10:19 PM  Result Value Ref Range   Troponin I (High Sensitivity) 55 (H) <18 ng/L    Comment: (NOTE) Elevated high sensitivity troponin I (hsTnI) values  and significant  changes across serial measurements may suggest ACS but many other  chronic and acute conditions are known to elevate hsTnI results.  Refer to the "Links" section for chest pain algorithms and additional  guidance. Performed at [facility] Lab, [address], [place], [place] [address]   Lactic acid, plasma     Status: Abnormal   Collection Time: 03/27/23 10:20 PM  Result Value Ref Range   Lactic Acid, Venous 2.1 (HH) 0.5 - 1.9 mmol/L    Comment: CRITICAL VALUE NOTED. VALUE IS CONSISTENT WITH PREVIOUSLY REPORTED/CALLED VALUE Performed at [facility] Lab, [address] [place], [place] [address]    EEG adult  Result Date: 03/28/2023 Charlsie Quest, MD     03/28/2023  7:33 AM Patient Name: Mallory Thomas MRN: [id number] Epilepsy Attending: Charlsie Quest Referring Physician/Provider: Erick Blinks, MD Date: 03/27/2023 Duration: 27.54 mins Patient history:  89 y.o. female with medical history significant for CKD stage IV, AAS s/p TAVR, T2DM, HTN, HLD, PAD s/p right iliac stent 2017, CAD, right breast cancer in remission who presented after she was found down with coffee-ground emesis.  Workup revealing for widespread metastatic appearing disease. EEG to evaluate for seizure. Level of alertness: Awake AEDs during EEG study: LEV Technical aspects: This EEG study was done with scalp electrodes positioned according to the 10-20 International system of electrode placement. Electrical activity was reviewed with band pass filter of 1-70Hz , sensitivity of 7 uV/mm, display speed of 40mm/sec with a 60Hz  notched filter applied as appropriate. EEG data were recorded continuously and  digitally stored.  Video monitoring was available and reviewed as appropriate. Description: The posterior dominant rhythm consists of 7 Hz activity of moderate voltage (25-35 uV) seen predominantly in posterior head regions, symmetric and reactive to eye opening and eye closing. EEG showed continuous generalized predominantly 5-6 Hz theta slowing admixed with 2-3Hz  delta slowing. Hyperventilation and photic stimulation were not performed.    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 No seizures or epileptiform discharges were seen throughout the recording.  Please note lack of epileptiform activity interictal EEG does not exclude the diagnosis of epilepsy.  Charlsie Quest    MR Brain W and Wo Contrast  Result Date: 03/27/2023 CLINICAL DATA:  Brain metastases.  Intracranial hemorrhage. EXAM: MRI HEAD WITHOUT AND WITH CONTRAST TECHNIQUE: Multiplanar, multiecho pulse sequences of the brain and surrounding structures were obtained without and with intravenous contrast. CONTRAST:  7mL GADAVIST GADOBUTROL 1 MMOL/ML IV SOLN COMPARISON:  Head CT 03/27/2023 FINDINGS: Brain: There are numerous hemorrhagic lesions scattered throughout both cerebral and cerebellar hemispheres. The worst is in the right cerebellar hemisphere where there is a large amount of vasogenic edema causing leftward midline shift of the fourth ventricle. The lesions show minimal contrast enhancement and are most evident on the FLAIR sequence. The most conspicuous lesions are located the right frontal lobe, left parietal lobe, left occipital lobe and right cerebellum. The right cerebellar lesion is the largest and measures approximately 3.5 x 1.7 cm. Vascular: Normal flow voids. Skull and upper cervical spine: Normal marrow signal. Sinuses/Orbits: Negative. Other: None. IMPRESSION: 1. Numerous hemorrhagic  lesions scattered throughout both cerebral and cerebellar hemispheres, consistent with metastatic disease. The worst is in the right cerebellar hemisphere where there is a large amount of vasogenic edema causing leftward midline shift of the fourth ventricle. 2. Allowing for different modality,  unchanged size of intraparenchymal hematoma in the medial right cerebellum. Electronically Signed   By: Deatra Robinson M.D.   On: 03/27/2023 21:58   CT CHEST ABDOMEN PELVIS WO CONTRAST  Result Date: 03/27/2023 CLINICAL DATA:  Polytrauma. EXAM: CT CHEST, ABDOMEN AND PELVIS WITHOUT CONTRAST TECHNIQUE: Multidetector CT imaging of the chest, abdomen and pelvis was performed following the standard protocol without IV contrast. RADIATION DOSE REDUCTION: This exam was performed according to the departmental dose-optimization program which includes automated exposure control, adjustment of the mA and/or kV according to patient size and/or use of iterative reconstruction technique. COMPARISON:  None Avai CT abdomen and pelvis lable. CTA chest abdomen and pelvis 01/10/2018. FINDINGS: CT CHEST FINDINGS Cardiovascular: Heart is mildly enlarged. Patient is status post aortic valve replacement. There is no pericardial effusion. There are atherosclerotic calcifications of the aorta. There is no pericardial effusion. Mediastinum/Nodes: There is no evidence for mediastinal hematoma or pneumomediastinum. There are enlarged left hilar lymph nodes measuring up to 1.7 x 1.7 cm. There is an enlarged subcarinal lymph node measuring 12 mm short axis. There are enlarged paratracheal lymph nodes and precarinal lymph nodes measuring up to 11 mm. There is a large hiatal hernia containing the proximal stomach. There is a small amount of air in the esophagus. The visualized thyroid gland is within normal limits. Lungs/Pleura: There are innumerable bilateral pulmonary nodules and masses. The largest is a left apical mass abuts the pleura measuring 5.1 x  4.7 by 5.1 cm. Other prominent nodules/masses are in the left lower lobe measuring 4.0 by 3.4 cm image 4/91 and right middle lobe measuring 3.3 x 3.9 cm image 4/92. There is no pleural effusion or pneumothorax. Trachea and central airways are patent. Musculoskeletal: No acute fractures are identified. No suspicious focal osseous lesion. CT ABDOMEN PELVIS FINDINGS Hepatobiliary: Multiple hepatic masses are identified predominantly in the right liver. The largest is in the right lobe measuring 11 x 8.5 cm. There is gallbladder sludge versus small stones. No biliary ductal dilatation. Pancreas: Unremarkable. No pancreatic ductal dilatation or surrounding inflammatory changes. Spleen: Calcified granulomas are present. Otherwise within normal limits. Adrenals/Urinary Tract: The bladder is decompressed by Foley catheter. There is moderate left-sided hydroureteronephrosis to the level of the pelvis likely secondary to obstruction by left pelvic mass. There is no right-sided hydronephrosis. There is no urinary tract calculus. The adrenal glands are within normal limits. Stomach/Bowel: Large hiatal hernia is present containing the proximal stomach. No evidence of bowel wall thickening, distention, or inflammatory changes. There is sigmoid colon diverticulosis. The appendix is not visualized. Vascular/Lymphatic: Aorta and IVC are normal in size. There are severe atherosclerotic calcifications of the aorta. There is an enlarged left retroperitoneal lymph node the level of the kidney measuring 2.0 x 1.3 cm image 3/71. There is an enlarged left retroperitoneal lymph node the level of the mid abdominal aorta measuring 1.5 x 2.3 cm image 3/80. There is a left adnexal/left pelvic sidewall mass abutting left iliac vasculature. This is ill-defined due to lack of contrast. This mass measures proximally 6.1 x 4.8 x 5.9 cm. Reproductive: Uterus and right adnexa are within normal limits. There is a left adnexal mass as described above.  Other: There is no ascites or focal abdominal wall hernia. Musculoskeletal: No acute fractures are identified. No focal osseous lesion. Degenerative changes affect the spine. IMPRESSION: 1. No acute posttraumatic sequelae in the chest, abdomen or pelvis. 2. Left adnexal/left pelvic sidewall mass worrisome for primary ovarian neoplasm. This causes moderate left-sided hydroureteronephrosis. 3.  Multiple pulmonary nodules and masses most compatible with metastatic disease. 4. Mediastinal and left hilar lymphadenopathy worrisome for metastatic disease. 5. Hepatic metastatic disease. 6. Large hiatal hernia. 7. Cholelithiasis versus gallbladder sludge. Aortic Atherosclerosis (ICD10-I70.0). Electronically Signed   By: Darliss Cheney M.D.   On: 03/27/2023 20:49   CT Head Wo Contrast  Result Date: 03/27/2023 CLINICAL DATA:  Mental status change, unknown cause; Neck trauma (Age >= 65y) EXAM: CT HEAD WITHOUT CONTRAST CT CERVICAL SPINE WITHOUT CONTRAST TECHNIQUE: Multidetector CT imaging of the head and cervical spine was performed following the standard protocol without intravenous contrast. Multiplanar CT image reconstructions of the cervical spine were also generated. RADIATION DOSE REDUCTION: This exam was performed according to the departmental dose-optimization program which includes automated exposure control, adjustment of the mA and/or kV according to patient size and/or use of iterative reconstruction technique. COMPARISON:  None Available. FINDINGS: CT HEAD FINDINGS Brain: No evidence of large-territorial acute infarction. There is a 2.3 x 1.9 cm right cerebellar masslike rounded hyperdensity with an adjacent 1.3 x 0.9 cm masslike hyperdensity. Surrounding vasogenic edema of the largest density. There is also a 0.8 x 0.6 cm left cerebellar round hyperdensity. A 1 cm x 0.7 cm right frontal hyperdense mass is noted (3:16). A 0.7 x 0.9 cm left occipital masslike lesion (3:28). Question associated right cerebellar  subarachnoid hemorrhage. Question right frontal calvarial convexity mass versus subarachnoid hemorrhage(5:36). No mass effect or midline shift. No hydrocephalus. Basilar cisterns are patent. Vascular: No hyperdense vessel. Atherosclerotic calcifications are present within the cavernous internal carotid arteries. Skull: No acute fracture or focal lesion. Sinuses/Orbits: Paranasal sinuses and mastoid air cells are clear. Bilateral lens replacement. The orbits are unremarkable. Other: None. CT CERVICAL SPINE FINDINGS Alignment: Normal. Skull base and vertebrae: Moderate to severe multilevel degenerative changes of the spine. No associated severe osseous neural foraminal or central canal stenosis. No acute fracture. No aggressive appearing focal osseous lesion or focal pathologic process. Soft tissues and spinal canal: No prevertebral fluid or swelling. No visible canal hematoma. Upper chest: Left apex pulmonary mass poorly visualized. Innumerable scattered pulmonary nodules within bilateral lung apices. Other: Atherosclerotic plaque of the carotid arteries within the neck as well as main branches the aortic arch. IMPRESSION: 1. Findings suggestive of metastatic malignancy involving the lungs and brain. Please see separately dictated CT chest, abdomen, pelvis 03/27/2023. 2. Bilateral cerebral and cerebellar masses/metastases with superimposed hemorrhage not excluded. Recommend MRI brain with and without contrast for further evaluation. 3. Question right cerebellar subarachnoid hemorrhage. 4. Question right frontal calvarial convexity mass versus subarachnoid hemorrhage. 5. No acute displaced fracture or traumatic listhesis of the cervical spine. These results were called by telephone at the time of interpretation on 03/27/2023 at 8:38 pm to provider Gloris Manchester , who verbally acknowledged these results. Electronically Signed   By: Tish Frederickson M.D.   On: 03/27/2023 20:49   CT Cervical Spine Wo Contrast  Result  Date: 03/27/2023 CLINICAL DATA:  Mental status change, unknown cause; Neck trauma (Age >= 65y) EXAM: CT HEAD WITHOUT CONTRAST CT CERVICAL SPINE WITHOUT CONTRAST TECHNIQUE: Multidetector CT imaging of the head and cervical spine was performed following the standard protocol without intravenous contrast. Multiplanar CT image reconstructions of the cervical spine were also generated. RADIATION DOSE REDUCTION: This exam was performed according to the departmental dose-optimization program which includes automated exposure control, adjustment of the mA and/or kV according to patient size and/or use of iterative reconstruction technique. COMPARISON:  None Available. FINDINGS: CT HEAD FINDINGS Brain:  No evidence of large-territorial acute infarction. There is a 2.3 x 1.9 cm right cerebellar masslike rounded hyperdensity with an adjacent 1.3 x 0.9 cm masslike hyperdensity. Surrounding vasogenic edema of the largest density. There is also a 0.8 x 0.6 cm left cerebellar round hyperdensity. A 1 cm x 0.7 cm right frontal hyperdense mass is noted (3:16). A 0.7 x 0.9 cm left occipital masslike lesion (3:28). Question associated right cerebellar subarachnoid hemorrhage. Question right frontal calvarial convexity mass versus subarachnoid hemorrhage(5:36). No mass effect or midline shift. No hydrocephalus. Basilar cisterns are patent. Vascular: No hyperdense vessel. Atherosclerotic calcifications are present within the cavernous internal carotid arteries. Skull: No acute fracture or focal lesion. Sinuses/Orbits: Paranasal sinuses and mastoid air cells are clear. Bilateral lens replacement. The orbits are unremarkable. Other: None. CT CERVICAL SPINE FINDINGS Alignment: Normal. Skull base and vertebrae: Moderate to severe multilevel degenerative changes of the spine. No associated severe osseous neural foraminal or central canal stenosis. No acute fracture. No aggressive appearing focal osseous lesion or focal pathologic process. Soft  tissues and spinal canal: No prevertebral fluid or swelling. No visible canal hematoma. Upper chest: Left apex pulmonary mass poorly visualized. Innumerable scattered pulmonary nodules within bilateral lung apices. Other: Atherosclerotic plaque of the carotid arteries within the neck as well as main branches the aortic arch. IMPRESSION: 1. Findings suggestive of metastatic malignancy involving the lungs and brain. Please see separately dictated CT chest, abdomen, pelvis 03/27/2023. 2. Bilateral cerebral and cerebellar masses/metastases with superimposed hemorrhage not excluded. Recommend MRI brain with and without contrast for further evaluation. 3. Question right cerebellar subarachnoid hemorrhage. 4. Question right frontal calvarial convexity mass versus subarachnoid hemorrhage. 5. No acute displaced fracture or traumatic listhesis of the cervical spine. These results were called by telephone at the time of interpretation on 03/27/2023 at 8:38 pm to provider Gloris Manchester , who verbally acknowledged these results. Electronically Signed   By: Tish Frederickson M.D.   On: 03/27/2023 20:49   DG Chest Port 1 View  Result Date: 03/27/2023 CLINICAL DATA:  Questionable sepsis EXAM: PORTABLE CHEST 1 VIEW COMPARISON:  Chest x-ray 02/15/2018. FINDINGS: There are multiple new bilateral pulmonary nodules and masses. The largest is in the left lung apex measuring 6.6 cm. There is focal triangular shaped opacity in the lower right hemithorax which may represent atelectasis, airspace disease or pleural effusion. There is no pneumothorax. The heart is enlarged. Patient is status post TAVR. No acute fractures are seen. IMPRESSION: 1. Multiple new bilateral pulmonary nodules and masses, highly suspicious for metastatic disease. 2. Focal triangular shaped opacity in the lower right hemithorax which may represent atelectasis, airspace disease or pleural effusion. Electronically Signed   By: Darliss Cheney M.D.   On: 03/27/2023 19:14     Pending Labs Unresulted Labs (From admission, onward)     Start     Ordered   03/27/23 1731  Blood Culture (routine x 2)  (Undifferentiated presentation (screening labs and basic nursing orders))  BLOOD CULTURE X 2,   STAT,   Status:  Canceled      03/27/23 1732            Vitals/Pain Today's Vitals   03/28/23 0615 03/28/23 0616 03/28/23 0812 03/28/23 0813  BP: 131/63 131/63    Pulse: 85 85    Resp:  16    Temp: 98.8 F (37.1 C) 98.8 F (37.1 C)    TempSrc:  Oral    SpO2: 91% 91%  92%  Weight:  69.2 kg  Height:  5\' 2"  (1.575 m)    PainSc:  0-No pain 0-No pain     Isolation Precautions No active isolations  Medications Medications  dexamethasone (DECADRON) injection 4 mg (4 mg Intravenous Given 03/28/23 0615)  acetaminophen (TYLENOL) tablet 650 mg (has no administration in time range)    Or  acetaminophen (TYLENOL) suppository 650 mg (has no administration in time range)  haloperidol (HALDOL) tablet 0.5 mg (has no administration in time range)    Or  haloperidol (HALDOL) 2 MG/ML solution 0.5 mg (has no administration in time range)    Or  haloperidol lactate (HALDOL) injection 0.5 mg (has no administration in time range)  ondansetron (ZOFRAN-ODT) disintegrating tablet 4 mg (has no administration in time range)    Or  ondansetron (ZOFRAN) injection 4 mg (has no administration in time range)  glycopyrrolate (ROBINUL) tablet 1 mg ( Oral See Alternative 03/28/23 0147)    Or  glycopyrrolate (ROBINUL) injection 0.2 mg ( Subcutaneous See Alternative 03/28/23 0147)    Or  glycopyrrolate (ROBINUL) injection 0.2 mg (0.2 mg Intravenous Given 03/28/23 0147)  antiseptic oral rinse (BIOTENE) solution 15 mL (has no administration in time range)  polyvinyl alcohol (LIQUIFILM TEARS) 1.4 % ophthalmic solution 1 drop (has no administration in time range)  diphenhydrAMINE (BENADRYL) injection 12.5 mg (has no administration in time range)  HYDROmorphone (DILAUDID) injection 0.5 mg  (has no administration in time range)  LORazepam (ATIVAN) tablet 1 mg (has no administration in time range)    Or  LORazepam (ATIVAN) 2 MG/ML concentrated solution 1 mg (has no administration in time range)    Or  LORazepam (ATIVAN) injection 1 mg (has no administration in time range)  levETIRAcetam (KEPPRA) IVPB 500 mg/100 mL premix (has no administration in time range)  lactated ringers bolus 1,000 mL (0 mLs Intravenous Stopped 03/27/23 1935)  lactated ringers bolus 1,000 mL (0 mLs Intravenous Stopped 03/27/23 2247)  ceFEPIme (MAXIPIME) 2 g in sodium chloride 0.9 % 100 mL IVPB (0 g Intravenous Stopped 03/27/23 2247)  levETIRAcetam (KEPPRA) IVPB 1500 mg/ 100 mL premix (0 mg Intravenous Stopped 03/27/23 2312)  dexamethasone (DECADRON) injection 10 mg (10 mg Intravenous Given 03/27/23 2050)  ondansetron (ZOFRAN) injection 4 mg (4 mg Intravenous Given 03/27/23 2050)  gadobutrol (GADAVIST) 1 MMOL/ML injection 7 mL (7 mLs Intravenous Contrast Given 03/27/23 2131)    Mobility Pt is not ambulating     Focused Assessments     R Recommendations: See Admitting Provider Note  Report given to:   Additional Notes: Patient recognize her family somewhat, families in the room. Son and daughter said she will squeeze their hands. Ann Maki said she is out. MD change Keppra from po to IV, because I felt she was not alert enough to swallow.

## 2023-03-28 NOTE — Progress Notes (Signed)
   CC: Altered Mental Status   Subjective:  Patient reports feeling "awesome." She reports no concerns at this time.  Objective:  BP 131/63 (BP Location: Right Arm)   Pulse 85   Temp 98.8 F (37.1 C) (Oral)   Resp 16   Ht 5\' 2"  (1.575 m)   Wt 69.2 kg   SpO2 92%   BMI 27.90 kg/m   General exam: Appears calm and comfortable in bed. No distress or signs to suggests pain. Respiratory system: Clear to auscultation. Respiratory effort normal. Cardiovascular system: S1 & S2 heard, RRR. Gastrointestinal system: Abdomen is nondistended, soft and nontender Normal bowel sounds heard. Central nervous system: Alert and oriented to self. Musculoskeletal: No calf tenderness Skin: No cyanosis. 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 Patient without history of symptoms correlated to findings. Brain lesions with evidence of hemorrhage in addition to vasogenic edema and midline shift. Decision from family to focus on comfort measures and plan for hospice care on discharge. Patient started on Decadron for symptom management and Keppra  for seizure prophylaxis. Palliative care consulted as well.  Coffee-ground emesis Occurred prior to admission. Patient with a slightly low but stable hemoglobin. At this time, patient is comfort measures. No further evaluation.  SIRS without sepsis Diabetes mellitus unsure if controlled Primary hypertension CKD stage IV Chronic anemia CAD  Jacquelin Hawking, MD [facility] 03/28/2023, 12:10 PM

## 2023-03-28 NOTE — Progress Notes (Addendum)
Civil engineer, contracting [facility]) Hospital Liaison Note    Received request from PMT provider/A. Katrinka Blazing, NP (transitions of Care Manager Debby Bud aware) for family interest in [facility]. Visited patient at bedside and spoke with daughter/Heidi to confirm interest and explain services.   Approval for [employer] [employer] is determined by [facility] MD. Once [facility] MD has determined [facility] eligibility, [facility] will update hospital staff and family  Please do not hesitate to call with any hospice related questions    Thank you for the opportunity to participate in this patient's care.  Addendum 5:16 pm-- Patient has been approved for [employer]. At this time, plan is for patient to transfer to [facility] on 5.13 via [facility] once consents are complete.  Eugenie Birks, MSW  [facility] Liaison  [phone]

## 2023-03-29 DIAGNOSIS — I614 Nontraumatic intracerebral hemorrhage in cerebellum: Secondary | ICD-10-CM

## 2023-03-29 DIAGNOSIS — Z515 Encounter for palliative care: Secondary | ICD-10-CM | POA: Diagnosis not present

## 2023-03-29 DIAGNOSIS — Z7189 Other specified counseling: Secondary | ICD-10-CM | POA: Diagnosis not present

## 2023-03-29 DIAGNOSIS — Z66 Do not resuscitate: Secondary | ICD-10-CM | POA: Diagnosis not present

## 2023-03-29 DIAGNOSIS — R918 Other nonspecific abnormal finding of lung field: Secondary | ICD-10-CM | POA: Diagnosis not present

## 2023-03-29 DIAGNOSIS — R739 Hyperglycemia, unspecified: Secondary | ICD-10-CM | POA: Diagnosis not present

## 2023-03-29 DIAGNOSIS — Z789 Other specified health status: Secondary | ICD-10-CM | POA: Diagnosis not present

## 2023-03-29 DIAGNOSIS — C7989 Secondary malignant neoplasm of other specified sites: Secondary | ICD-10-CM | POA: Diagnosis not present

## 2023-03-29 DIAGNOSIS — R531 Weakness: Secondary | ICD-10-CM | POA: Diagnosis not present

## 2023-03-29 DIAGNOSIS — Z7401 Bed confinement status: Secondary | ICD-10-CM | POA: Diagnosis not present

## 2023-03-29 DIAGNOSIS — R410 Disorientation, unspecified: Secondary | ICD-10-CM

## 2023-03-29 LAB — CULTURE, BLOOD (ROUTINE X 2)

## 2023-03-29 MED ORDER — DEXAMETHASONE SODIUM PHOSPHATE 4 MG/ML IJ SOLN: 4.0000 mg | Freq: Four times a day (QID) | INTRAMUSCULAR | Status: DC

## 2023-03-29 MED ORDER — LEVETIRACETAM IN NACL 500 MG/100ML IV SOLN: 500.0000 mg | Freq: Two times a day (BID) | INTRAVENOUS | Status: DC

## 2023-03-29 NOTE — Progress Notes (Signed)
Pt discharged to [facility] this pm ?

## 2023-03-29 NOTE — Progress Notes (Signed)
Civil engineer, contracting [facility]) Hospital Liaison Note  Plan is to transfer to [facility] today. Unit RN please call report to [phone] prior to patient leaving the unit. Please send signed DNR and paperwork with patient.   Please leave all IV access in place.   Please call with any questions or concerns. Thank you  Dionicio Stall, [facility] Liaison [phone]

## 2023-03-29 NOTE — Discharge Summary (Signed)
Physician Discharge Summary   Patient: Mallory Thomas MRN: [id number] DOB: [birth year]  Admit date:     03/27/2023  Discharge date: 03/29/23  Discharge Physician: Jacquelin Hawking, MD   PCP: Melida Quitter, MD   Recommendations at discharge:  Hospice/comfort measures  Discharge Diagnoses: Principal Problem:   Metastatic disease 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 *  Hospital Course: Mallory Thomas is a 89 y.o. female with medical history significant for CKD stage IV, AAS s/p TAVR, T2DM, HTN, HLD, PAD s/p right iliac stent 2017, CAD, right breast cancer in remission who presented after she was found down with coffee-ground emesis.  Workup revealing for widespread metastatic appearing disease.  Family have made decision to transition to comfort care measures.  Assessment and Plan:  Metastatic disease Unknown primary but with lesions involving the brain, lungs, liver, left adnexa, pelvic sidewall and lymph nodes. Patient without history of symptoms correlated to findings. Brain lesions with evidence of hemorrhage in addition to vasogenic edema and midline shift. Decision from family to focus on comfort measures and plan for hospice care on discharge. Patient started on Decadron for symptom management and Keppra for seizure prophylaxis. Palliative care consulted as well and decision made to transition to hospice facility.   Coffee-ground emesis Occurred prior to admission. Patient with a slightly low but stable hemoglobin. At this time, patient is comfort measures. No further evaluation.   SIRS without sepsis Diabetes mellitus  unsure if controlled Primary hypertension CKD stage IV Chronic anemia CAD Comfort measures  Consultants: Palliative care medicine Procedures performed: None  Disposition: Hospice care Diet recommendation: As tolerated   DISCHARGE MEDICATION: Allergies as of 03/29/2023       Reactions   Fish Allergy Hives   Iodine Hives   Allergic to ALL seafood, shrimp, crab etc.   Olmesartan Other (See Comments)   Hyperkalemia   Shellfish Allergy Hives   Latex Hives   Tape Hives   Zetia [ezetimibe] Diarrhea        Medication List     STOP taking these medications    alendronate 70 MG tablet Commonly known as: FOSAMAX   amLODipine 10 MG tablet Commonly known as: NORVASC   aspirin 81 MG tablet   atorvastatin 20 MG tablet Commonly known as: LIPITOR   dapagliflozin propanediol 10 MG Tabs tablet Commonly known as: FARXIGA   hydrochlorothiazide 12.5 MG capsule Commonly known as: Microzide   metFORMIN 500 MG 24 hr tablet Commonly known as: GLUCOPHAGE-XR       TAKE these medications    dexamethasone 4 MG/ML injection Commonly known as: DECADRON Inject 1 mL (4 mg total) into the vein every 6 (six) hours.   levETIRAcetam 500 MG/100ML Soln Commonly known as: KEPRRA Inject 100 mLs (500 mg total) into the vein 2 (two) times daily.        Discharge Exam: BP (!) 153/65 (BP Location: Right Arm)   Pulse 91   Temp 98.7 F (37.1 C) (Axillary)   Resp 16   Ht 5\' 2"  (1.575 m)   Wt 69.2 kg   SpO2 93%   BMI 27.90 kg/m   General: No distress. Comfortable, laying  in bed.  Condition at discharge:  Comfort measures  The results of significant diagnostics from this hospitalization (including imaging, microbiology, ancillary and laboratory) are listed below for reference.   Imaging Studies: EEG adult  Result Date: 03-Apr-2023 Charlsie Quest, MD     04/03/2023  7:33 AM Patient Name: Mallory Thomas MRN: [id number] Epilepsy Attending: Charlsie Quest Referring  Physician/Provider: Erick Blinks, MD Date: 03/27/2023 Duration: 27.54 mins Patient history:  89 y.o. female with medical history significant for CKD stage IV, AAS s/p TAVR, T2DM, HTN, HLD, PAD s/p right iliac stent 2017, CAD, right breast cancer in remission who presented after she was found down with coffee-ground emesis.  Workup revealing for widespread metastatic appearing disease. EEG to evaluate for seizure. Level of alertness: Awake AEDs during EEG study: LEV Technical aspects: This EEG study was done with scalp electrodes positioned according to the 10-20 International system of electrode placement. Electrical activity was reviewed with band pass filter of 1-70Hz , sensitivity of 7 uV/mm, display speed of 22mm/sec with a 60Hz  notched filter applied as appropriate. EEG data were recorded continuously and digitally stored.  Video monitoring was available and reviewed as appropriate. Description: The posterior dominant rhythm consists of 7 Hz activity of moderate voltage (25-35 uV) seen predominantly in posterior head regions, symmetric and reactive to eye opening and eye closing. EEG showed continuous generalized predominantly 5-6 Hz theta slowing admixed with 2-3Hz  delta slowing. Hyperventilation and photic stimulation were not performed.    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 No seizures or epileptiform discharges were seen throughout the recording.  Please note lack of epileptiform activity interictal EEG does not exclude the diagnosis of epilepsy.  Charlsie Quest    MR Brain W and Wo Contrast  Result Date: 03/27/2023 CLINICAL DATA:  Brain metastases.  Intracranial hemorrhage. EXAM: MRI HEAD WITHOUT AND WITH CONTRAST TECHNIQUE: Multiplanar, multiecho pulse sequences of the brain and surrounding structures were obtained without and with intravenous  contrast. CONTRAST:  7mL GADAVIST GADOBUTROL 1 MMOL/ML IV SOLN COMPARISON:  Head CT 03/27/2023 FINDINGS: Brain: There are numerous hemorrhagic lesions scattered throughout both cerebral and cerebellar hemispheres. The worst is in the right cerebellar hemisphere where there is a large amount of vasogenic edema causing leftward midline shift of the fourth ventricle. The lesions show minimal contrast enhancement and are most evident on the FLAIR sequence. The most conspicuous lesions are located the right frontal lobe, left parietal lobe, left occipital lobe and right cerebellum. The right cerebellar lesion is the largest and measures approximately 3.5 x 1.7 cm. Vascular: Normal flow voids. Skull and upper cervical spine: Normal marrow signal. Sinuses/Orbits: Negative. Other: None. IMPRESSION: 1. Numerous hemorrhagic lesions scattered throughout both cerebral and cerebellar hemispheres, consistent with metastatic disease. The worst is in the right cerebellar hemisphere where there is a large amount of vasogenic edema causing leftward midline shift of the fourth ventricle. 2. Allowing for different modality, unchanged size of intraparenchymal hematoma in the medial right cerebellum. Electronically Signed   By: Deatra Robinson M.D.   On: 03/27/2023 21:58   CT CHEST ABDOMEN PELVIS WO CONTRAST  Result Date: 03/27/2023 CLINICAL DATA:  Polytrauma. EXAM: CT CHEST, ABDOMEN AND PELVIS WITHOUT CONTRAST TECHNIQUE: Multidetector CT imaging of the chest, abdomen and pelvis was performed following the standard protocol without IV contrast. RADIATION DOSE REDUCTION: This exam was performed according to the departmental dose-optimization program which includes  automated exposure control, adjustment of the mA and/or kV according to patient size and/or use of iterative reconstruction technique. COMPARISON:  None Avai CT abdomen and pelvis lable. CTA chest abdomen and pelvis 01/10/2018. FINDINGS: CT CHEST FINDINGS Cardiovascular: Heart  is mildly enlarged. Patient is status post aortic valve replacement. There is no pericardial effusion. There are atherosclerotic calcifications of the aorta. There is no pericardial effusion. Mediastinum/Nodes: There is no evidence for mediastinal hematoma or pneumomediastinum. There are enlarged left hilar lymph nodes measuring up to 1.7 x 1.7 cm. There is an enlarged subcarinal lymph node measuring 12 mm short axis. There are enlarged paratracheal lymph nodes and precarinal lymph nodes measuring up to 11 mm. There is a large hiatal hernia containing the proximal stomach. There is a small amount of air in the esophagus. The visualized thyroid gland is within normal limits. Lungs/Pleura: There are innumerable bilateral pulmonary nodules and masses. The largest is a left apical mass abuts the pleura measuring 5.1 x 4.7 by 5.1 cm. Other prominent nodules/masses are in the left lower lobe measuring 4.0 by 3.4 cm image 4/91 and right middle lobe measuring 3.3 x 3.9 cm image 4/92. There is no pleural effusion or pneumothorax. Trachea and central airways are patent. Musculoskeletal: No acute fractures are identified. No suspicious focal osseous lesion. CT ABDOMEN PELVIS FINDINGS Hepatobiliary: Multiple hepatic masses are identified predominantly in the right liver. The largest is in the right lobe measuring 11 x 8.5 cm. There is gallbladder sludge versus small stones. No biliary ductal dilatation. Pancreas: Unremarkable. No pancreatic ductal dilatation or surrounding inflammatory changes. Spleen: Calcified granulomas are present. Otherwise within normal limits. Adrenals/Urinary Tract: The bladder is decompressed by Foley catheter. There is moderate left-sided hydroureteronephrosis to the level of the pelvis likely secondary to obstruction by left pelvic mass. There is no right-sided hydronephrosis. There is no urinary tract calculus. The adrenal glands are within normal limits. Stomach/Bowel: Large hiatal hernia is  present containing the proximal stomach. No evidence of bowel wall thickening, distention, or inflammatory changes. There is sigmoid colon diverticulosis. The appendix is not visualized. Vascular/Lymphatic: Aorta and IVC are normal in size. There are severe atherosclerotic calcifications of the aorta. There is an enlarged left retroperitoneal lymph node the level of the kidney measuring 2.0 x 1.3 cm image 3/71. There is an enlarged left retroperitoneal lymph node the level of the mid abdominal aorta measuring 1.5 x 2.3 cm image 3/80. There is a left adnexal/left pelvic sidewall mass abutting left iliac vasculature. This is ill-defined due to lack of contrast. This mass measures proximally 6.1 x 4.8 x 5.9 cm. Reproductive: Uterus and right adnexa are within normal limits. There is a left adnexal mass as described above. Other: There is no ascites or focal abdominal wall hernia. Musculoskeletal: No acute fractures are identified. No focal osseous lesion. Degenerative changes affect the spine. IMPRESSION: 1. No acute posttraumatic sequelae in the chest, abdomen or pelvis. 2. Left adnexal/left pelvic sidewall mass worrisome for primary ovarian neoplasm. This causes moderate left-sided hydroureteronephrosis. 3. Multiple pulmonary nodules and masses most compatible with metastatic disease. 4. Mediastinal and left hilar lymphadenopathy worrisome for metastatic disease. 5. Hepatic metastatic disease. 6. Large hiatal hernia. 7. Cholelithiasis versus gallbladder sludge. Aortic Atherosclerosis (ICD10-I70.0). Electronically Signed   By: Darliss Cheney M.D.   On: 03/27/2023 20:49   CT Head Wo Contrast  Result Date: 03/27/2023 CLINICAL DATA:  Mental status change, unknown cause; Neck trauma (Age >= 65y) EXAM: CT HEAD WITHOUT CONTRAST CT CERVICAL SPINE WITHOUT  CONTRAST TECHNIQUE: Multidetector CT imaging of the head and cervical spine was performed following the standard protocol without intravenous contrast. Multiplanar CT  image reconstructions of the cervical spine were also generated. RADIATION DOSE REDUCTION: This exam was performed according to the departmental dose-optimization program which includes automated exposure control, adjustment of the mA and/or kV according to patient size and/or use of iterative reconstruction technique. COMPARISON:  None Available. FINDINGS: CT HEAD FINDINGS Brain: No evidence of large-territorial acute infarction. There is a 2.3 x 1.9 cm right cerebellar masslike rounded hyperdensity with an adjacent 1.3 x 0.9 cm masslike hyperdensity. Surrounding vasogenic edema of the largest density. There is also a 0.8 x 0.6 cm left cerebellar round hyperdensity. A 1 cm x 0.7 cm right frontal hyperdense mass is noted (3:16). A 0.7 x 0.9 cm left occipital masslike lesion (3:28). Question associated right cerebellar subarachnoid hemorrhage. Question right frontal calvarial convexity mass versus subarachnoid hemorrhage(5:36). No mass effect or midline shift. No hydrocephalus. Basilar cisterns are patent. Vascular: No hyperdense vessel. Atherosclerotic calcifications are present within the cavernous internal carotid arteries. Skull: No acute fracture or focal lesion. Sinuses/Orbits: Paranasal sinuses and mastoid air cells are clear. Bilateral lens replacement. The orbits are unremarkable. Other: None. CT CERVICAL SPINE FINDINGS Alignment: Normal. Skull base and vertebrae: Moderate to severe multilevel degenerative changes of the spine. No associated severe osseous neural foraminal or central canal stenosis. No acute fracture. No aggressive appearing focal osseous lesion or focal pathologic process. Soft tissues and spinal canal: No prevertebral fluid or swelling. No visible canal hematoma. Upper chest: Left apex pulmonary mass poorly visualized. Innumerable scattered pulmonary nodules within bilateral lung apices. Other: Atherosclerotic plaque of the carotid arteries within the neck as well as main branches the  aortic arch. IMPRESSION: 1. Findings suggestive of metastatic malignancy involving the lungs and brain. Please see separately dictated CT chest, abdomen, pelvis 03/27/2023. 2. Bilateral cerebral and cerebellar masses/metastases with superimposed hemorrhage not excluded. Recommend MRI brain with and without contrast for further evaluation. 3. Question right cerebellar subarachnoid hemorrhage. 4. Question right frontal calvarial convexity mass versus subarachnoid hemorrhage. 5. No acute displaced fracture or traumatic listhesis of the cervical spine. These results were called by telephone at the time of interpretation on 03/27/2023 at 8:38 pm to provider Gloris Manchester , who verbally acknowledged these results. Electronically Signed   By: Tish Frederickson M.D.   On: 03/27/2023 20:49   CT Cervical Spine Wo Contrast  Result Date: 03/27/2023 CLINICAL DATA:  Mental status change, unknown cause; Neck trauma (Age >= 65y) EXAM: CT HEAD WITHOUT CONTRAST CT CERVICAL SPINE WITHOUT CONTRAST TECHNIQUE: Multidetector CT imaging of the head and cervical spine was performed following the standard protocol without intravenous contrast. Multiplanar CT image reconstructions of the cervical spine were also generated. RADIATION DOSE REDUCTION: This exam was performed according to the departmental dose-optimization program which includes automated exposure control, adjustment of the mA and/or kV according to patient size and/or use of iterative reconstruction technique. COMPARISON:  None Available. FINDINGS: CT HEAD FINDINGS Brain: No evidence of large-territorial acute infarction. There is a 2.3 x 1.9 cm right cerebellar masslike rounded hyperdensity with an adjacent 1.3 x 0.9 cm masslike hyperdensity. Surrounding vasogenic edema of the largest density. There is also a 0.8 x 0.6 cm left cerebellar round hyperdensity. A 1 cm x 0.7 cm right frontal hyperdense mass is noted (3:16). A 0.7 x 0.9 cm left occipital masslike lesion (3:28).  Question associated right cerebellar subarachnoid hemorrhage. Question right frontal calvarial convexity mass  versus subarachnoid hemorrhage(5:36). No mass effect or midline shift. No hydrocephalus. Basilar cisterns are patent. Vascular: No hyperdense vessel. Atherosclerotic calcifications are present within the cavernous internal carotid arteries. Skull: No acute fracture or focal lesion. Sinuses/Orbits: Paranasal sinuses and mastoid air cells are clear. Bilateral lens replacement. The orbits are unremarkable. Other: None. CT CERVICAL SPINE FINDINGS Alignment: Normal. Skull base and vertebrae: Moderate to severe multilevel degenerative changes of the spine. No associated severe osseous neural foraminal or central canal stenosis. No acute fracture. No aggressive appearing focal osseous lesion or focal pathologic process. Soft tissues and spinal canal: No prevertebral fluid or swelling. No visible canal hematoma. Upper chest: Left apex pulmonary mass poorly visualized. Innumerable scattered pulmonary nodules within bilateral lung apices. Other: Atherosclerotic plaque of the carotid arteries within the neck as well as main branches the aortic arch. IMPRESSION: 1. Findings suggestive of metastatic malignancy involving the lungs and brain. Please see separately dictated CT chest, abdomen, pelvis 03/27/2023. 2. Bilateral cerebral and cerebellar masses/metastases with superimposed hemorrhage not excluded. Recommend MRI brain with and without contrast for further evaluation. 3. Question right cerebellar subarachnoid hemorrhage. 4. Question right frontal calvarial convexity mass versus subarachnoid hemorrhage. 5. No acute displaced fracture or traumatic listhesis of the cervical spine. These results were called by telephone at the time of interpretation on 03/27/2023 at 8:38 pm to provider Gloris Manchester , who verbally acknowledged these results. Electronically Signed   By: Tish Frederickson M.D.   On: 03/27/2023 20:49   DG  Chest Port 1 View  Result Date: 03/27/2023 CLINICAL DATA:  Questionable sepsis EXAM: PORTABLE CHEST 1 VIEW COMPARISON:  Chest x-ray 02/15/2018. FINDINGS: There are multiple new bilateral pulmonary nodules and masses. The largest is in the left lung apex measuring 6.6 cm. There is focal triangular shaped opacity in the lower right hemithorax which may represent atelectasis, airspace disease or pleural effusion. There is no pneumothorax. The heart is enlarged. Patient is status post TAVR. No acute fractures are seen. IMPRESSION: 1. Multiple new bilateral pulmonary nodules and masses, highly suspicious for metastatic disease. 2. Focal triangular shaped opacity in the lower right hemithorax which may represent atelectasis, airspace disease or pleural effusion. Electronically Signed   By: Darliss Cheney M.D.   On: 03/27/2023 19:14    Microbiology: Results for orders placed or performed during the hospital encounter of 03/27/23  Blood Culture (routine x 2)     Status: None (Preliminary result)   Collection Time: 03/27/23  6:25 PM   Specimen: BLOOD RIGHT FOREARM  Result Value Ref Range Status   Specimen Description BLOOD RIGHT FOREARM  Final   Special Requests   Final    BOTTLES DRAWN AEROBIC AND ANAEROBIC Blood Culture adequate volume   Culture   Final    NO GROWTH 2 DAYS Performed at [facility], [address] [place], [place] [address]    Report Status PENDING  Incomplete  Blood Culture (routine x 2)     Status: None (Preliminary result)   Collection Time: 03/27/23  6:30 PM   Specimen: BLOOD LEFT FOREARM  Result Value Ref Range Status   Specimen Description BLOOD LEFT FOREARM  Final   Special Requests   Final    BOTTLES DRAWN AEROBIC AND ANAEROBIC Blood Culture results may not be optimal due to an excessive volume of blood received in culture bottles   Culture   Final    NO GROWTH 2 DAYS Performed at [facility], [address] [place], [place]  [address]    Report Status  PENDING  Incomplete    Labs: CBC: Recent Labs  Lab 03/27/23 1731 03/27/23 1844  WBC 14.6*  --   NEUTROABS 13.3*  --   HGB 9.7* 10.9*  10.9*  HCT 31.7* 32.0*  32.0*  MCV 85.4  --   PLT 384  --    Basic Metabolic Panel: Recent Labs  Lab 03/27/23 1731 03/27/23 1844  NA 138 137  138  K 4.8 4.3  4.3  CL 102 106  CO2 19*  --   GLUCOSE 386* 399*  BUN 57* 52*  CREATININE 1.88* 1.80*  CALCIUM 8.5*  --   MG 1.9  --    Liver Function Tests: Recent Labs  Lab 03/27/23 1731  AST 50*  ALT 28  ALKPHOS 162*  BILITOT 1.0  PROT 7.2  ALBUMIN 2.8*   Discharge time spent: 35 minutes.  Signed: Jacquelin Hawking, MD [facility] 03/29/2023

## 2023-03-29 NOTE — Progress Notes (Signed)
Daily Progress Note   Patient Name: Mallory Thomas       Date: 03/29/2023 DOB: Oct 19, 1932  Age: 89 y.o. MRN#: [id number] Attending Physician: Narda Bonds, MD Primary Care Physician: Melida Quitter, MD Admit Date: 03/27/2023  Reason for Consultation/Follow-up: Non pain symptom management, Pain control, Psychosocial/spiritual support, and Terminal Care  Subjective: I have reviewed medical records including EPIC notes, MAR, and labs. Received report from primary RN - no acute concerns.  Went to visit patient at bedside - daughter/Heidi present. Patient was lying in bed asleep - I did not attempt to wake her to preserve comfort. No signs or non-verbal gestures of pain or discomfort noted. No respiratory distress, increased work of breathing, or secretions noted.   Emotional support provided to [facility]. Therapeutic listening provided as she reflects on the discussions with hospice liaison - consents have been completed and she verifies goal is for transfer to [facility] today. Reviewed anticipated discharge process.   Patient opens her eyes: Heidi asks if she is in pain - patient answers "no." Heidi asks if she is comfortable - patient answers "very" and smiles.   All questions and concerns addressed. Encouraged to call with questions and/or concerns. PMT card previously provided.  Length of Stay: 2  Current Medications: Scheduled Meds:   dexamethasone (DECADRON) injection  4 mg Intravenous Q6H    Continuous Infusions:  levETIRAcetam Stopped (03/28/23 1323)    PRN Meds: acetaminophen **OR** acetaminophen, antiseptic oral rinse, diphenhydrAMINE, glycopyrrolate **OR** glycopyrrolate **OR** glycopyrrolate, haloperidol **OR** haloperidol **OR** haloperidol lactate, HYDROmorphone  (DILAUDID) injection, LORazepam **OR** LORazepam **OR** LORazepam, ondansetron **OR** ondansetron (ZOFRAN) IV, polyvinyl alcohol  Physical Exam Vitals and nursing note reviewed.  Constitutional:      General: She is not in acute distress. Pulmonary:     Effort: No respiratory distress.  Skin:    General: Skin is warm and dry.  Neurological:     Mental Status: She is lethargic.     Motor: Weakness present.             Vital Signs: BP (!) 153/65 (BP Location: Right Arm)   Pulse 91   Temp 98.7 F (37.1 C) (Axillary)   Resp 16   Ht 5\' 2"  (1.575 m)   Wt 69.2 kg   SpO2 93%   BMI 27.90 kg/m  SpO2: SpO2: 93 % O2 Device: O2 Device: Room Air O2 Flow Rate: O2 Flow Rate (L/min): 2 L/min  Intake/output summary:  Intake/Output Summary (Last 24 hours) at 03/29/2023 1022 Last data filed at 03/29/2023 0600 Gross per 24 hour  Intake 86.71 ml  Output 700 ml  Net -613.29 ml   LBM: Last BM Date : 03/27/23 Baseline Weight: Weight: 69.2 kg Most recent weight: Weight: 69.2 kg       Palliative Assessment/Data: PPS 20%      Patient Active Problem List   Diagnosis Date Noted   Metastatic disease (HCC) 03/27/2023   Comfort measures only status 03/27/2023   CKD (chronic kidney disease), stage IV (HCC) 03/27/2023   Diabetic vasculopathy (HCC) 01/05/2023   S/P TAVR (transcatheter aortic valve replacement) 02/15/2018   Severe aortic stenosis 02/15/2018   Hypertension 01/06/2018   Type 2 diabetes mellitus (HCC) 01/06/2018   Hypercholesteremia 01/06/2018   Bilateral carotid bruits 01/06/2018   Coronary artery disease involving native coronary artery with other form of angina pectoris, unspecified whether native or transplanted heart (HCC) 01/06/2018   Malignant neoplasm of overlapping sites of right breast in female, estrogen receptor positive (HCC) 07/01/2017   Aortic stenosis 07/01/2017   Claudication in peripheral vascular disease (HCC) 11/08/2016   CKD (chronic kidney disease), stage  III (HCC) 07/09/2015    Palliative Care Assessment & Plan   Patient Profile: 89 y.o. female  with past medical history of CKD stage IV, AAS s/p TAVR, T2DM, HTN, HLD, PAD s/p right iliac stent 2017, CAD, right breast cancer in remission presented to the ED on 03/27/23 from home after unwitnessed fall now with AMS. CT head showed findings suggestive of metastatic malignancy involving lungs and brain.  CT chest/abdomen/pelvis was worrisome for primary ovarian neoplasm.  MRI showed numerous hemorrhagic lesions. 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  After goals of care discussions with admitting provider, family has opted for patient's transition to full comfort measures.   Assessment: Principal Problem:   Metastatic disease (HCC) Active Problems:   Type 2 diabetes mellitus (HCC)   Coronary artery disease involving native coronary artery with other form of angina pectoris, unspecified whether native or transplanted heart (HCC)   S/P TAVR (transcatheter aortic valve replacement)   Comfort measures only status   CKD (chronic kidney disease), stage IV (HCC)   Terminal care  Recommendations/Plan: Continue full comfort measures Continue DNR/DNI as previously documented Discharge to [facility] today Continue current comfort focused mediation regimen - no changes  Symptom Management Continue Decadron and Keppra for edema and seizure prophylaxis Dilaudid PRN pain/dyspnea/increased work of breathing/RR>25 Tylenol PRN pain/fever Biotin PRN dry mouth Benadryl PRN itching Robinul PRN secretions Haldol PRN agitation/delirium Ativan PRN anxiety/seizure/sleep/distress Zofran PRN nausea/vomiting Liquifilm Tears PRN dry eye  Goals of Care and Additional Recommendations: Limitations on Scope of Treatment: Full Comfort Care  Code Status:    Code Status Orders  (From admission,  onward)           Start     Ordered   03/27/23 2258  Do not attempt resuscitation (DNR)  Continuous       Question Answer Comment  If patient has no pulse and is not breathing Do Not Attempt Resuscitation   If patient has a pulse and/or is breathing: Medical Treatment Goals COMFORT MEASURES: Keep clean/warm/dry, use medication by any route; positioning, wound care and other measures to relieve pain/suffering; use  oxygen, suction/manual treatment of airway obstruction for comfort; do not transfer unless for comfort needs.   Consent: Discussion documented in EHR or advanced directives reviewed      03/27/23 2259           Code Status History     Date Active Date Inactive Code Status Order ID Comments User Context   11/22/2018 0912 11/22/2018 1608 Full Code [id number]  Elder Negus, MD Inpatient   02/15/2018 1548 02/17/2018 1647 Full Code [id number]  Tonny Bollman, MD Inpatient   12/24/2017 1259 12/24/2017 2027 Full Code [id number]  Yates Decamp, MD Inpatient   11/10/2016 1116 11/10/2016 1923 Full Code [id number]  Yates Decamp, MD Inpatient       Prognosis:  < 2 weeks  Discharge Planning: Hospice facility  Care plan was discussed with primary RN, TOC, Dr. Caleb Popp, patient's family  Thank you for allowing the Palliative Medicine Team to assist in the care of this patient.  Haskel Khan, NP  Please contact Palliative Medicine Team phone at [phone] for questions and concerns.   *Portions of this note are a verbal dictation therefore any spelling and/or grammatical errors are due to the "[employer]" system interpretation.

## 2023-03-29 NOTE — TOC CM/SW Note (Signed)
Asked by Danford Bad with [facility] to arrange transport to [facility] vis ambulance.   PTAR called . PTAR and DNR form on chart.   Bedside nurse aware

## 2023-03-30 LAB — CULTURE, BLOOD (ROUTINE X 2): Special Requests: ADEQUATE

## 2023-04-01 LAB — CULTURE, BLOOD (ROUTINE X 2)
Culture: NO GROWTH
Culture: NO GROWTH

## 2023-04-06 ENCOUNTER — Ambulatory Visit: Payer: Medicare HMO | Admitting: Podiatry

## 2023-04-17 DEATH — deceased

## 2023-07-29 ENCOUNTER — Ambulatory Visit: Payer: Self-pay | Admitting: Internal Medicine
